# Patient Record
Sex: Female | Born: 1950 | Race: White | Hispanic: No | Marital: Married | Smoking: Never smoker
Health system: Southern US, Community
[De-identification: ages and names within clinical notes are randomized; demographics above are authoritative.]

## PROBLEM LIST (undated history)

## (undated) DIAGNOSIS — R079 Chest pain, unspecified: Secondary | ICD-10-CM

## (undated) DIAGNOSIS — F32A Depression, unspecified: Secondary | ICD-10-CM

## (undated) DIAGNOSIS — T7840XA Allergy, unspecified, initial encounter: Secondary | ICD-10-CM

## (undated) DIAGNOSIS — H40059 Ocular hypertension, unspecified eye: Secondary | ICD-10-CM

## (undated) DIAGNOSIS — M8580 Other specified disorders of bone density and structure, unspecified site: Secondary | ICD-10-CM

## (undated) DIAGNOSIS — R42 Dizziness and giddiness: Secondary | ICD-10-CM

## (undated) DIAGNOSIS — F329 Major depressive disorder, single episode, unspecified: Secondary | ICD-10-CM

## (undated) DIAGNOSIS — R519 Headache, unspecified: Secondary | ICD-10-CM

## (undated) DIAGNOSIS — E785 Hyperlipidemia, unspecified: Secondary | ICD-10-CM

## (undated) DIAGNOSIS — M4800 Spinal stenosis, site unspecified: Secondary | ICD-10-CM

## (undated) HISTORY — PX: EYE SURGERY: SHX253

## (undated) HISTORY — DX: Allergy, unspecified, initial encounter: T78.40XA

## (undated) HISTORY — DX: Other specified disorders of bone density and structure, unspecified site: M85.80

## (undated) HISTORY — PX: SPINE SURGERY: SHX786

## (undated) HISTORY — PX: JOINT REPLACEMENT: SHX530

## (undated) HISTORY — PX: APPENDECTOMY: SHX54

## (undated) HISTORY — DX: Hyperlipidemia, unspecified: E78.5

## (undated) HISTORY — PX: DILATION AND CURETTAGE OF UTERUS: SHX78

---
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
## 1986-10-25 HISTORY — PX: REDUCTION MAMMAPLASTY: SUR839

## 2005-12-06 ENCOUNTER — Ambulatory Visit: Payer: Self-pay | Admitting: General Practice

## 2005-12-24 ENCOUNTER — Ambulatory Visit: Payer: Self-pay | Admitting: Family Medicine

## 2006-04-01 ENCOUNTER — Ambulatory Visit: Payer: Self-pay | Admitting: General Surgery

## 2007-01-27 ENCOUNTER — Ambulatory Visit: Payer: Self-pay | Admitting: Family Medicine

## 2007-01-27 LAB — HM MAMMOGRAPHY

## 2008-04-05 ENCOUNTER — Ambulatory Visit: Payer: Self-pay | Admitting: Obstetrics and Gynecology

## 2008-04-05 ENCOUNTER — Other Ambulatory Visit: Payer: Self-pay

## 2008-04-19 ENCOUNTER — Ambulatory Visit: Payer: Self-pay | Admitting: Obstetrics and Gynecology

## 2008-05-22 ENCOUNTER — Emergency Department: Payer: Self-pay | Admitting: Emergency Medicine

## 2009-04-22 ENCOUNTER — Ambulatory Visit: Payer: Self-pay | Admitting: Obstetrics and Gynecology

## 2010-05-20 ENCOUNTER — Ambulatory Visit: Payer: Self-pay | Admitting: Obstetrics and Gynecology

## 2010-06-04 ENCOUNTER — Ambulatory Visit: Payer: Self-pay | Admitting: Gastroenterology

## 2010-06-04 LAB — HM COLONOSCOPY

## 2011-04-01 ENCOUNTER — Ambulatory Visit: Payer: Self-pay | Admitting: Family Medicine

## 2011-06-11 ENCOUNTER — Ambulatory Visit: Payer: Self-pay | Admitting: Obstetrics and Gynecology

## 2012-06-15 ENCOUNTER — Ambulatory Visit: Payer: Self-pay | Admitting: Obstetrics and Gynecology

## 2013-06-18 ENCOUNTER — Ambulatory Visit: Payer: Self-pay | Admitting: Obstetrics and Gynecology

## 2014-06-19 ENCOUNTER — Ambulatory Visit: Payer: Self-pay | Admitting: Obstetrics and Gynecology

## 2014-09-02 ENCOUNTER — Ambulatory Visit: Payer: Self-pay

## 2015-05-13 ENCOUNTER — Encounter: Payer: Self-pay | Admitting: Emergency Medicine

## 2015-05-13 ENCOUNTER — Ambulatory Visit: Payer: BLUE CROSS/BLUE SHIELD

## 2015-05-13 ENCOUNTER — Ambulatory Visit
Admission: EM | Admit: 2015-05-13 | Discharge: 2015-05-13 | Disposition: A | Discharge status: Home / Self Care | Payer: BLUE CROSS/BLUE SHIELD | Attending: Family Medicine | Admitting: Family Medicine

## 2015-05-13 DIAGNOSIS — R0781 Pleurodynia: Secondary | ICD-10-CM | POA: Diagnosis present

## 2015-05-13 DIAGNOSIS — S20211A Contusion of right front wall of thorax, initial encounter: Secondary | ICD-10-CM | POA: Insufficient documentation

## 2015-05-13 DIAGNOSIS — W19XXXA Unspecified fall, initial encounter: Secondary | ICD-10-CM | POA: Insufficient documentation

## 2015-05-13 MED ORDER — HYDROCODONE-ACETAMINOPHEN 5-325 MG PO TABS: 1.0000 | ORAL_TABLET | Freq: Four times a day (QID) | ORAL | Status: DC | PRN

## 2015-05-13 MED ORDER — MUPIROCIN 2 % EX OINT: 1.0000 "application " | TOPICAL_OINTMENT | Freq: Three times a day (TID) | CUTANEOUS | Status: DC

## 2015-05-13 NOTE — ED Provider Notes (Signed)
CSN: [id number]     Arrival date & time 05/13/15  1107 History   None    Chief Complaint  Patient presents with  . Fall  . Rib Injury   (Consider location/radiation/quality/duration/timing/severity/associated sxs/prior Treatment) HPI  This a 64 year old female who states that she fell approximately 6 feet standing on a rock wall landing into some rose bushes 2 days prior to this admission. She had numerous scrapes on her lower extremities and unfortunately hit her right ribs on the wall itself. Since then she's had a lot of pain in her right ribs where there is some bruising and extreme tenderness to palpation. She denies any shortness of breath hemoptysis or crepitus in the area. She has picked out several thorns from her lower extremities. There is also bruising in her inner thighs bilaterally.   History reviewed. No pertinent past medical history. Past Surgical History  Procedure Laterality Date  . Cesarean section    . Appendectomy     History reviewed. No pertinent family history. History  Substance Use Topics  . Smoking status: Never Smoker   . Smokeless tobacco: Never Used  . Alcohol Use: Yes   OB History    No data available     Review of Systems  All other systems reviewed and are negative.   Allergies  Prednisone  Home Medications   Prior to Admission medications   Medication Sig Start Date End Date Taking? Authorizing Provider  calcium & magnesium carbonates (MYLANTA) 311-232 MG per tablet Take 1 tablet by mouth daily.   Yes Historical Provider, MD  cholecalciferol (VITAMIN D) 400 UNITS TABS tablet Take 400 Units by mouth daily.   Yes Historical Provider, MD  citalopram (CELEXA) 10 MG tablet Take 10 mg by mouth daily.   Yes Historical Provider, MD  omega-3 acid ethyl esters (LOVAZA) 1 G capsule Take 1 g by mouth 2 (two) times daily.   Yes Historical Provider, MD  HYDROcodone-acetaminophen (NORCO/VICODIN) 5-325 MG per tablet Take 1-2 tablets by mouth every 6  (six) hours as needed. 05/13/15   Lorin Picket, PA-C  mupirocin ointment (BACTROBAN) 2 % Apply 1 application topically 3 (three) times daily. 05/13/15   Malachy Chamber Shaunessy Dobratz, PA-C   BP 113/65 mmHg  Pulse 66  Temp(Src) 98.6 F (37 C) (Tympanic)  Resp 16  Ht 5\' 2"  (1.575 m)  Wt 158 lb (71.668 kg)  BMI 28.89 kg/m2  SpO2 100% Physical Exam  Constitutional: She is oriented to person, place, and time. She appears well-developed and well-nourished.  HENT:  Head: Normocephalic and atraumatic.  Eyes: EOM are normal. Pupils are equal, round, and reactive to light.  Neck: Neck supple.  Pulmonary/Chest: Effort normal and breath sounds normal. No respiratory distress. She has no wheezes. She has no rales. She exhibits no tenderness.  Musculoskeletal:  Examination of the chest was done in the presence of Marsh Dolly., RN. The lungs show full expansion bilaterally and are clear to auscultation. Is no crepitus over the right lateral lung field. There is ecchymosis in 2 small areas along the rib probably 7 or 8 level. No induration is present. The sounds are good in the apices bilaterally. There is tenderness along the rib as the ecchymosis from the posterior axillary line to the costochondral junction. Lower extremity examination shows numerous noninfected scratches and bruises on the medial inner thighs proximally. Several puncture wounds are also seen and appeared to be healing.  Neurological: She is alert and oriented to person, place, and time.  Skin: Skin is warm and dry. There is erythema.  Psychiatric: She has a normal mood and affect. Her behavior is normal. Judgment and thought content normal.    ED Course  Procedures (including critical care time) Labs Review Labs Reviewed - No data to display  Imaging Review    Dg Ribs Unilateral W/chest Right  05/13/2015   CLINICAL DATA:  Fall, anterior right rib pain  EXAM: RIGHT RIBS AND CHEST - 3+ VIEW  COMPARISON:  09/02/2014  FINDINGS: Five views right  ribs submitted. No acute infiltrate or pulmonary edema. No rib fracture. No pneumothorax.  IMPRESSION: Negative.   Electronically Signed   By: Lahoma Crocker M.D.   On: 05/13/2015 12:26     MDM   1. Contusion of ribs, right, initial encounter    New Prescriptions   HYDROCODONE-ACETAMINOPHEN (NORCO/VICODIN) 5-325 MG PER TABLET    Take 1-2 tablets by mouth every 6 (six) hours as needed.   MUPIROCIN OINTMENT (BACTROBAN) 2 %    Apply 1 application topically 3 (three) times daily.   Plan: 1. Test/x-ray results and diagnosis reviewed with patient 2. rx as per orders; risks, benefits, potential side effects reviewed with patient 3. Recommend supportive treatment with pulmonary toilet, bactroban UGT to scratches. 4. F/u prn if symptoms worsen or don't improve    Lorin Picket, PA-C 05/13/15 1317

## 2015-05-13 NOTE — Discharge Instructions (Signed)
Rib Contusion °A rib contusion (bruise) can occur by a blow to the chest or by a fall against a hard object. Usually these will be much better in a couple weeks. If X-rays were taken today and there are no broken bones (fractures), the diagnosis of bruising is made. However, broken ribs may not show up for several days, or may be discovered later on a routine X-ray when signs of healing show up. If this happens to you, it does not mean that something was missed on the X-ray, but simply that it did not show up on the first X-rays. Earlier diagnosis will not usually change the treatment. °HOME CARE INSTRUCTIONS  °· Avoid strenuous activity. Be careful during activities and avoid bumping the injured ribs. Activities that pull on the injured ribs and cause pain should be avoided, if possible. °· For the first day or two, an ice pack used every 20 minutes while awake may be helpful. Put ice in a plastic bag and put a towel between the bag and the skin. °· Eat a normal, well-balanced diet. Drink plenty of fluids to avoid constipation. °· Take deep breaths several times a day to keep lungs free of infection. Try to cough several times a day. Splint the injured area with a pillow while coughing to ease pain. Coughing can help prevent pneumonia. °· Wear a rib belt or binder only if told to do so by your caregiver. If you are wearing a rib belt or binder, you must do the breathing exercises as directed by your caregiver. If not used properly, rib belts or binders restrict breathing which can lead to pneumonia. °· Only take over-the-counter or prescription medicines for pain, discomfort, or fever as directed by your caregiver. °SEEK MEDICAL CARE IF:  °· You or your child has an oral temperature above 102° F (38.9° C). °· Your baby is older than 3 months with a rectal temperature of 100.5° F (38.1° C) or higher for more than 1 day. °· You develop a cough, with thick or bloody sputum. °SEEK IMMEDIATE MEDICAL CARE IF:  °· You  have difficulty breathing. °· You feel sick to your stomach (nausea), have vomiting or belly (abdominal) pain. °· You have worsening pain, not controlled with medications, or there is a change in the location of the pain. °· You develop sweating or radiation of the pain into the arms, jaw or shoulders, or become light headed or faint. °· You or your child has an oral temperature above 102° F (38.9° C), not controlled by medicine. °· Your or your baby is older than 3 months with a rectal temperature of 102° F (38.9° C) or higher. °· Your baby is 3 months old or younger with a rectal temperature of 100.4° F (38° C) or higher. °MAKE SURE YOU:  °· Understand these instructions. °· Will watch your condition. °· Will get help right away if you are not doing well or get worse. °Document Released: 07/06/2001 Document Revised: 02/05/2013 Document Reviewed: 05/29/2008 °Exit[employer]® Patient Information ©2015 [employer]. This information is not intended to replace advice given to you by your health care provider. Make sure you discuss any questions you have with your health care provider. ° °

## 2015-05-13 NOTE — ED Notes (Signed)
Patient c/o right sided rib pain worse with deep breath and coughing.  Patient states that she fell off a wall 2 days ago and hit the right side of her ribcage on the wall.

## 2015-06-04 ENCOUNTER — Other Ambulatory Visit: Payer: Self-pay | Admitting: Obstetrics and Gynecology

## 2015-06-04 DIAGNOSIS — Z1231 Encounter for screening mammogram for malignant neoplasm of breast: Secondary | ICD-10-CM

## 2015-06-04 DIAGNOSIS — Z803 Family history of malignant neoplasm of breast: Secondary | ICD-10-CM

## 2015-06-23 ENCOUNTER — Ambulatory Visit
Admission: RE | Admit: 2015-06-23 | Discharge: 2015-06-23 | Disposition: A | Discharge status: Home / Self Care | Payer: BLUE CROSS/BLUE SHIELD | Source: Ambulatory Visit | Attending: Obstetrics and Gynecology | Admitting: Obstetrics and Gynecology

## 2015-06-23 DIAGNOSIS — Z1231 Encounter for screening mammogram for malignant neoplasm of breast: Secondary | ICD-10-CM | POA: Insufficient documentation

## 2015-06-23 DIAGNOSIS — Z803 Family history of malignant neoplasm of breast: Secondary | ICD-10-CM

## 2015-09-10 DIAGNOSIS — F32A Depression, unspecified: Secondary | ICD-10-CM

## 2015-09-10 DIAGNOSIS — F329 Major depressive disorder, single episode, unspecified: Secondary | ICD-10-CM | POA: Insufficient documentation

## 2015-09-10 DIAGNOSIS — M8580 Other specified disorders of bone density and structure, unspecified site: Secondary | ICD-10-CM

## 2015-09-10 DIAGNOSIS — E7800 Pure hypercholesterolemia, unspecified: Secondary | ICD-10-CM

## 2015-09-10 DIAGNOSIS — J309 Allergic rhinitis, unspecified: Secondary | ICD-10-CM

## 2015-09-12 ENCOUNTER — Ambulatory Visit (INDEPENDENT_AMBULATORY_CARE_PROVIDER_SITE_OTHER): Payer: BLUE CROSS/BLUE SHIELD | Admitting: Unknown Physician Specialty

## 2015-09-12 ENCOUNTER — Encounter: Payer: Self-pay | Admitting: Unknown Physician Specialty

## 2015-09-12 VITALS — BP 114/75 | HR 63 | Temp 97.8°F | Ht 62.2 in | Wt 163.0 lb

## 2015-09-12 DIAGNOSIS — F329 Major depressive disorder, single episode, unspecified: Secondary | ICD-10-CM | POA: Diagnosis not present

## 2015-09-12 DIAGNOSIS — Z23 Encounter for immunization: Secondary | ICD-10-CM | POA: Diagnosis not present

## 2015-09-12 DIAGNOSIS — F32A Depression, unspecified: Secondary | ICD-10-CM

## 2015-09-12 MED ORDER — CITALOPRAM HYDROBROMIDE 10 MG PO TABS: 10.0000 mg | ORAL_TABLET | Freq: Every day | ORAL | Status: DC

## 2015-09-12 NOTE — Progress Notes (Signed)
   BP 114/75 mmHg  Pulse 63  Temp(Src) 97.8 F (36.6 C)  Ht 5' 2.2" (1.58 m)  Wt 163 lb (73.936 kg)  BMI 29.62 kg/m2  SpO2 97%  LMP  (LMP Unknown)   Subjective:    Patient ID: Breanna Davidson, female    DOB: 09-06-1951, 65 y.o.   MRN: [id number]  HPI: Breanna Davidson is a 64 y.o. female  Chief Complaint  Patient presents with  . Medication Refill    pt states she needs refill on celexa   She was wondering if she should stop.  She feels good and has been on it for 6 months.    Depression screen PHQ 2/9 09/12/2015  Decreased Interest 0  Down, Depressed, Hopeless 0  PHQ - 2 Score 0      Relevant past medical, surgical, family and social history reviewed and updated as indicated. Interim medical history since our last visit reviewed. Allergies and medications reviewed and updated.  Review of Systems  Per HPI unless specifically indicated above     Objective:    BP 114/75 mmHg  Pulse 63  Temp(Src) 97.8 F (36.6 C)  Ht 5' 2.2" (1.58 m)  Wt 163 lb (73.936 kg)  BMI 29.62 kg/m2  SpO2 97%  LMP  (LMP Unknown)  Wt Readings from Last 3 Encounters:  09/12/15 163 lb (73.936 kg)  03/10/15 161 lb (73.029 kg)  05/13/15 158 lb (71.668 kg)    Physical Exam  Constitutional: She is oriented to person, place, and time. She appears well-developed and well-nourished. No distress.  HENT:  Head: Normocephalic and atraumatic.  Eyes: Conjunctivae and lids are normal. Right eye exhibits no discharge. Left eye exhibits no discharge. No scleral icterus.  Cardiovascular: Normal rate, regular rhythm and normal heart sounds.   Pulmonary/Chest: Effort normal. No respiratory distress.  Abdominal: Normal appearance. There is no splenomegaly or hepatomegaly.  Musculoskeletal: Normal range of motion.  Neurological: She is alert and oriented to person, place, and time.  Skin: Skin is intact. No rash noted. No pallor.  Psychiatric: She has a normal mood and affect. Her behavior is  normal. Judgment and thought content normal.     Assessment & Plan:   Problem List Items Addressed This Visit      Unprioritized   Depression    Doing well.  Encouraged to consider tapering down after 6 months.         Other Visit Diagnoses    Immunization due    -  Primary    Relevant Orders    Varicella-zoster vaccine subcutaneous (Completed)        Follow up plan: Return in about 6 months (around 03/11/2016).

## 2015-09-12 NOTE — Assessment & Plan Note (Signed)
Doing well.  Encouraged to consider tapering down after 6 months.

## 2015-12-22 ENCOUNTER — Encounter: Payer: Self-pay | Admitting: Unknown Physician Specialty

## 2015-12-22 ENCOUNTER — Ambulatory Visit
Admission: RE | Admit: 2015-12-22 | Discharge: 2015-12-22 | Disposition: A | Discharge status: Home / Self Care | Payer: BLUE CROSS/BLUE SHIELD | Source: Ambulatory Visit | Attending: Unknown Physician Specialty | Admitting: Unknown Physician Specialty

## 2015-12-22 ENCOUNTER — Ambulatory Visit (INDEPENDENT_AMBULATORY_CARE_PROVIDER_SITE_OTHER): Payer: BLUE CROSS/BLUE SHIELD | Admitting: Unknown Physician Specialty

## 2015-12-22 VITALS — BP 116/65 | HR 66 | Temp 98.7°F | Ht 62.6 in | Wt 162.0 lb

## 2015-12-22 DIAGNOSIS — R05 Cough: Secondary | ICD-10-CM | POA: Insufficient documentation

## 2015-12-22 DIAGNOSIS — R059 Cough, unspecified: Secondary | ICD-10-CM

## 2015-12-22 MED ORDER — DOXYCYCLINE HYCLATE 100 MG PO TABS: 100.0000 mg | ORAL_TABLET | Freq: Two times a day (BID) | ORAL | Status: DC

## 2015-12-22 NOTE — Progress Notes (Signed)
BP 116/65 mmHg  Pulse 66  Temp(Src) 98.7 F (37.1 C)  Ht 5' 2.6" (1.59 m)  Wt 162 lb (73.483 kg)  BMI 29.07 kg/m2  SpO2 97%  LMP  (LMP Unknown)   Subjective:    Patient ID: Breanna Davidson, female    DOB: April 02, 1951, 65 y.o.   MRN: [id number]  HPI: Breanna Davidson is a 65 y.o. female  Chief Complaint  Patient presents with  . Cough    pt states she has had a dry cough for a while. she states the cold weather and being around somebody smoking will make her cough   States her husband wanted her to come in.   Cough This is a chronic problem. Episode onset: 4 months ago. The problem has been unchanged. The cough is non-productive. Associated symptoms include shortness of breath and wheezing. Pertinent negatives include no chest pain, headaches, heartburn, nasal congestion or postnasal drip. Exacerbated by: improved with drinking.     Relevant past medical, surgical, family and social history reviewed and updated as indicated. Interim medical history since our last visit reviewed. Allergies and medications reviewed and updated.  Review of Systems  HENT: Negative for postnasal drip.   Respiratory: Positive for cough, shortness of breath and wheezing.   Cardiovascular: Negative for chest pain.  Gastrointestinal: Negative for heartburn.  Neurological: Negative for headaches.    Per HPI unless specifically indicated above     Objective:    BP 116/65 mmHg  Pulse 66  Temp(Src) 98.7 F (37.1 C)  Ht 5' 2.6" (1.59 m)  Wt 162 lb (73.483 kg)  BMI 29.07 kg/m2  SpO2 97%  LMP  (LMP Unknown)  Wt Readings from Last 3 Encounters:  12/22/15 162 lb (73.483 kg)  09/12/15 163 lb (73.936 kg)  03/10/15 161 lb (73.029 kg)    Physical Exam  Constitutional: She is oriented to person, place, and time. She appears well-developed and well-nourished. No distress.  HENT:  Head: Normocephalic and atraumatic.  Eyes: Conjunctivae and lids are normal. Right eye exhibits no discharge.  Left eye exhibits no discharge. No scleral icterus.  Neck: Normal range of motion. Neck supple. No JVD present. Carotid bruit is not present.  Cardiovascular: Normal rate, regular rhythm and normal heart sounds.   Pulmonary/Chest: Effort normal and breath sounds normal.  Noted crackles and wheezing left lower lungs clear with coughing  Abdominal: Normal appearance. There is no splenomegaly or hepatomegaly.  Musculoskeletal: Normal range of motion.  Neurological: She is alert and oriented to person, place, and time.  Skin: Skin is warm, dry and intact. No rash noted. No pallor.  Psychiatric: She has a normal mood and affect. Her behavior is normal. Judgment and thought content normal.    Results for orders placed or performed in visit on 09/10/15  HM MAMMOGRAPHY  Result Value Ref Range   HM Mammogram from [facility]   HM COLONOSCOPY  Result Value Ref Range   HM Colonoscopy from [facility]       Assessment & Plan:   Problem List Items Addressed This Visit    None    Visit Diagnoses    Cough    -  Primary    Pt with cough for 3 months.  Use Flonase daily.  Get a chest x-ray.  rx doxycycline to r/u atypical    Relevant Orders    Spirometry with Graph (Completed)    DG Chest 2 View        Follow up plan: Return  in about 1 week (around 12/29/2015).      -

## 2016-01-13 ENCOUNTER — Encounter: Payer: Self-pay | Admitting: Unknown Physician Specialty

## 2016-01-13 ENCOUNTER — Ambulatory Visit (INDEPENDENT_AMBULATORY_CARE_PROVIDER_SITE_OTHER): Payer: BLUE CROSS/BLUE SHIELD | Admitting: Unknown Physician Specialty

## 2016-01-13 VITALS — BP 108/72 | HR 63 | Temp 98.3°F | Ht 62.1 in | Wt 160.4 lb

## 2016-01-13 DIAGNOSIS — R059 Cough, unspecified: Secondary | ICD-10-CM

## 2016-01-13 DIAGNOSIS — R05 Cough: Secondary | ICD-10-CM | POA: Diagnosis not present

## 2016-01-13 NOTE — Progress Notes (Signed)
   BP 108/72 mmHg  Pulse 63  Temp(Src) 98.3 F (36.8 C)  Ht 5' 2.1" (1.577 m)  Wt 160 lb 6.4 oz (72.757 kg)  BMI 29.26 kg/m2  SpO2 98%  LMP  (LMP Unknown)   Subjective:    Patient ID: Breanna Davidson, female    DOB: 11-19-1950, 65 y.o.   MRN: [id number]  HPI: Breanna Davidson is a 65 y.o. female  Chief Complaint  Patient presents with  . Cough    pt states her cough has gotten better but states she still has a dry cough at night   Feels like dry cough is coming from her throat.  Thinks it might be allergies.  Takes daily Flonase.  Last chest x-ray was normal.     Relevant past medical, surgical, family and social history reviewed and updated as indicated. Interim medical history since our last visit reviewed. Allergies and medications reviewed and updated.  Review of Systems  Per HPI unless specifically indicated above     Objective:    BP 108/72 mmHg  Pulse 63  Temp(Src) 98.3 F (36.8 C)  Ht 5' 2.1" (1.577 m)  Wt 160 lb 6.4 oz (72.757 kg)  BMI 29.26 kg/m2  SpO2 98%  LMP  (LMP Unknown)  Wt Readings from Last 3 Encounters:  01/13/16 160 lb 6.4 oz (72.757 kg)  12/22/15 162 lb (73.483 kg)  09/12/15 163 lb (73.936 kg)    Physical Exam  Constitutional: She is oriented to person, place, and time. She appears well-developed and well-nourished. No distress.  HENT:  Head: Normocephalic and atraumatic.  Right Ear: Tympanic membrane and ear canal normal.  Left Ear: Tympanic membrane and ear canal normal.  Nose: Right sinus exhibits no maxillary sinus tenderness and no frontal sinus tenderness. Left sinus exhibits no maxillary sinus tenderness and no frontal sinus tenderness.  Mouth/Throat: Oropharynx is clear and moist and mucous membranes are normal.  Eyes: Conjunctivae and lids are normal. Right eye exhibits no discharge. Left eye exhibits no discharge. No scleral icterus.  Cardiovascular: Normal rate and regular rhythm.   Pulmonary/Chest: Effort normal and  breath sounds normal. No respiratory distress.  Abdominal: Normal appearance. There is no splenomegaly or hepatomegaly.  Musculoskeletal: Normal range of motion.  Neurological: She is alert and oriented to person, place, and time.  Skin: Skin is intact. No rash noted. No pallor.  Psychiatric: She has a normal mood and affect. Her behavior is normal. Judgment and thought content normal.    Results for orders placed or performed in visit on 09/10/15  HM MAMMOGRAPHY  Result Value Ref Range   HM Mammogram from [facility]   HM COLONOSCOPY  Result Value Ref Range   HM Colonoscopy from [facility]       Assessment & Plan:   Problem List Items Addressed This Visit    None    Visit Diagnoses    Cough    -  Primary    Seems to be resolved.  F/U prn++++++++++++++++++++++++++++++        Follow up plan: No Follow-up on file.

## 2016-02-25 DIAGNOSIS — H10813 Pingueculitis, bilateral: Secondary | ICD-10-CM | POA: Diagnosis not present

## 2016-03-15 ENCOUNTER — Ambulatory Visit: Payer: BLUE CROSS/BLUE SHIELD | Admitting: Unknown Physician Specialty

## 2016-03-17 DIAGNOSIS — R079 Chest pain, unspecified: Secondary | ICD-10-CM | POA: Diagnosis not present

## 2016-03-17 DIAGNOSIS — R0789 Other chest pain: Secondary | ICD-10-CM | POA: Diagnosis not present

## 2016-03-17 DIAGNOSIS — R072 Precordial pain: Secondary | ICD-10-CM | POA: Diagnosis not present

## 2016-03-19 ENCOUNTER — Encounter: Payer: Self-pay | Admitting: Unknown Physician Specialty

## 2016-03-19 ENCOUNTER — Ambulatory Visit (INDEPENDENT_AMBULATORY_CARE_PROVIDER_SITE_OTHER): Payer: Medicare Other | Admitting: Unknown Physician Specialty

## 2016-03-19 VITALS — BP 106/69 | HR 72 | Temp 98.6°F | Ht 61.3 in | Wt 163.0 lb

## 2016-03-19 DIAGNOSIS — Z0000 Encounter for general adult medical examination without abnormal findings: Secondary | ICD-10-CM | POA: Diagnosis not present

## 2016-03-19 DIAGNOSIS — R0789 Other chest pain: Secondary | ICD-10-CM | POA: Insufficient documentation

## 2016-03-19 DIAGNOSIS — F32A Depression, unspecified: Secondary | ICD-10-CM

## 2016-03-19 DIAGNOSIS — F329 Major depressive disorder, single episode, unspecified: Secondary | ICD-10-CM | POA: Diagnosis not present

## 2016-03-19 NOTE — Patient Instructions (Signed)
Please do call to schedule your bone denisty and mammogram; the number to schedule one at either [facility] or [facility] is [phone]

## 2016-03-19 NOTE — Assessment & Plan Note (Signed)
Stable.  Discussed weaning of Citalopram

## 2016-03-19 NOTE — Assessment & Plan Note (Addendum)
[facility]der referral to GI.  Pt does not want to take a daily PPI

## 2016-03-19 NOTE — Progress Notes (Signed)
BP 106/69 mmHg  Pulse 72  Temp(Src) 98.6 F (37 C)  Ht 5' 1.3" (1.557 m)  Wt 163 lb (73.936 kg)  BMI 30.50 kg/m2  SpO2 98%  LMP  (LMP Unknown)   Subjective:    Patient ID: Breanna Davidson, female    DOB: 1951-02-02, 65 y.o.   MRN: [id number]  HPI: Breanna Davidson is a 65 y.o. female  Chief Complaint  Patient presents with  . Depression  . Labs Only    Hep C and HIV orders entered   Depression screen [facility] 2/9 03/19/2016 09/12/2015  Decreased Interest 0 0  Down, Depressed, Hopeless 0 0  PHQ - 2 Score 0 0    She would like to wean off Citaloprine.    Pt states [place] was in the ER 2 [place] the beach.  I can't pull up chart on care everywhere but reviewed copies she brought in.  She had crushing chest pain with a negative work-up.  The pain is now better but worried that it will come back.  She would like to see a Copywriter, advertising.       Relevant past medical, surgical, family and social history reviewed and updated as indicated. Interim medical history since our last visit reviewed. Allergies and medications reviewed and updated.  Review of Systems  Per HPI unless specifically indicated above     Objective:    BP 106/69 mmHg  Pulse 72  Temp(Src) 98.6 F (37 C)  Ht 5' 1.3" (1.557 m)  Wt 163 lb (73.936 kg)  BMI 30.50 kg/m2  SpO2 98%  LMP  (LMP Unknown)  Wt Readings from Last 3 Encounters:  03/19/16 163 lb (73.936 kg)  01/13/16 160 lb 6.4 oz (72.757 kg)  12/22/15 162 lb (73.483 kg)    Physical Exam  Constitutional: She is oriented to person, place, and time. She appears well-developed and well-nourished. No distress.  HENT:  Head: Normocephalic and atraumatic.  Eyes: Conjunctivae and lids are normal. Right eye exhibits no discharge. Left eye exhibits no discharge. No scleral icterus.  Neck: Normal range of motion. Neck supple. No JVD present. Carotid bruit is not present.  Cardiovascular: Normal rate, regular rhythm and normal heart  sounds.   Pulmonary/Chest: Effort normal and breath sounds normal.  Abdominal: Normal appearance. There is no splenomegaly or hepatomegaly.  Musculoskeletal: Normal range of motion.  Neurological: She is alert and oriented to person, place, and time.  Skin: Skin is warm, dry and intact. No rash noted. No pallor.  Psychiatric: She has a normal mood and affect. Her behavior is normal. Judgment and thought content normal.    Results for orders placed or performed in visit on 09/10/15  HM MAMMOGRAPHY  Result Value Ref Range   HM Mammogram from [facility]   HM COLONOSCOPY  Result Value Ref Range   HM Colonoscopy from [facility]       Assessment & Plan:   Problem List Items Addressed This Visit      Unprioritized   Chest pain, non-cardiac    [facility] referral to GI.  Pt does not want to take a daily PPI      Depression    Stable.  Discussed weaning of Citalopram         Other Visit Diagnoses    Health care maintenance    -  Primary    Relevant Orders    Hepatitis C antibody    HIV antibody    DG Bone Density  MM DIGITAL SCREENING BILATERAL        Follow up plan: Return for physical.

## 2016-03-20 LAB — HEPATITIS C ANTIBODY

## 2016-03-20 LAB — HIV ANTIBODY (ROUTINE TESTING W REFLEX): HIV Screen 4th Generation wRfx: NONREACTIVE

## 2016-07-06 ENCOUNTER — Other Ambulatory Visit: Payer: Self-pay | Admitting: Unknown Physician Specialty

## 2016-07-06 ENCOUNTER — Ambulatory Visit
Admission: RE | Admit: 2016-07-06 | Discharge: 2016-07-06 | Disposition: A | Discharge status: Home / Self Care | Payer: Medicare Other | Source: Ambulatory Visit | Attending: Family Medicine | Admitting: Family Medicine

## 2016-07-06 ENCOUNTER — Telehealth: Payer: Self-pay | Admitting: Family Medicine

## 2016-07-06 ENCOUNTER — Ambulatory Visit
Admission: RE | Admit: 2016-07-06 | Discharge: 2016-07-06 | Disposition: A | Discharge status: Home / Self Care | Payer: Medicare Other | Source: Ambulatory Visit | Attending: Unknown Physician Specialty | Admitting: Unknown Physician Specialty

## 2016-07-06 DIAGNOSIS — M8589 Other specified disorders of bone density and structure, multiple sites: Secondary | ICD-10-CM | POA: Insufficient documentation

## 2016-07-06 DIAGNOSIS — IMO0002 Reserved for concepts with insufficient information to code with codable children: Secondary | ICD-10-CM

## 2016-07-06 DIAGNOSIS — Z13820 Encounter for screening for osteoporosis: Secondary | ICD-10-CM | POA: Diagnosis not present

## 2016-07-06 DIAGNOSIS — M8588 Other specified disorders of bone density and structure, other site: Secondary | ICD-10-CM | POA: Diagnosis not present

## 2016-07-06 DIAGNOSIS — N62 Hypertrophy of breast: Secondary | ICD-10-CM | POA: Insufficient documentation

## 2016-07-06 DIAGNOSIS — Z0000 Encounter for general adult medical examination without abnormal findings: Secondary | ICD-10-CM

## 2016-07-06 DIAGNOSIS — N644 Mastodynia: Secondary | ICD-10-CM | POA: Diagnosis not present

## 2016-07-06 NOTE — Telephone Encounter (Signed)
Scarlett from [facility] called and would like to have orders placed for the pt to have a diagnostic mammogram and ultrasound of her right breast due to breast enlargement .

## 2016-07-06 NOTE — Telephone Encounter (Signed)
Orders placed.

## 2016-07-08 ENCOUNTER — Encounter: Payer: Self-pay | Admitting: Family Medicine

## 2016-07-23 ENCOUNTER — Encounter: Payer: Self-pay | Admitting: Unknown Physician Specialty

## 2016-07-23 ENCOUNTER — Ambulatory Visit (INDEPENDENT_AMBULATORY_CARE_PROVIDER_SITE_OTHER): Payer: Medicare Other | Admitting: Unknown Physician Specialty

## 2016-07-23 VITALS — BP 113/73 | HR 72 | Temp 98.8°F | Ht 62.3 in | Wt 162.8 lb

## 2016-07-23 DIAGNOSIS — Z6828 Body mass index (BMI) 28.0-28.9, adult: Secondary | ICD-10-CM | POA: Insufficient documentation

## 2016-07-23 DIAGNOSIS — Z23 Encounter for immunization: Secondary | ICD-10-CM

## 2016-07-23 DIAGNOSIS — E7800 Pure hypercholesterolemia, unspecified: Secondary | ICD-10-CM | POA: Diagnosis not present

## 2016-07-23 DIAGNOSIS — E663 Overweight: Secondary | ICD-10-CM | POA: Insufficient documentation

## 2016-07-23 DIAGNOSIS — E669 Obesity, unspecified: Secondary | ICD-10-CM | POA: Insufficient documentation

## 2016-07-23 DIAGNOSIS — Z0000 Encounter for general adult medical examination without abnormal findings: Secondary | ICD-10-CM | POA: Diagnosis not present

## 2016-07-23 DIAGNOSIS — Z6829 Body mass index (BMI) 29.0-29.9, adult: Secondary | ICD-10-CM | POA: Insufficient documentation

## 2016-07-23 NOTE — Progress Notes (Signed)
BP 113/73 (BP Location: Left Arm, Patient Position: Sitting, Cuff Size: Large)   Pulse 72   Temp 98.8 F (37.1 C)   Ht 5' 2.3" (1.582 m)   Wt 162 lb 12.8 oz (73.8 kg)   LMP  (LMP Unknown)   SpO2 98%   BMI 29.49 kg/m    Subjective:    Patient ID: Breanna Davidson, female    DOB: 02-06-1951, 65 y.o.   MRN: [id number]  HPI: Breanna Davidson is a 65 y.o. female  Chief Complaint  Patient presents with  . Medicare Wellness   Depression screen [facility] 2/9 07/23/2016 03/19/2016 09/12/2015  Decreased Interest 0 0 0  Down, Depressed, Hopeless 0 0 0  PHQ - 2 Score 0 0 0  Altered sleeping 1 - -  Tired, decreased energy 0 - -  Change in appetite 0 - -  Feeling bad or failure about yourself  0 - -  Trouble concentrating 0 - -  Moving slowly or fidgety/restless 0 - -  Suicidal thoughts 0 - -  PHQ-9 Score 1 - -   Functional Status Survey: Is the patient deaf or have difficulty hearing?: No Does the patient have difficulty seeing, even when wearing glasses/contacts?: No Does the patient have difficulty concentrating, remembering, or making decisions?: No Does the patient have difficulty walking or climbing stairs?: No Does the patient have difficulty dressing or bathing?: No Does the patient have difficulty doing errands alone such as visiting a doctor's office or shopping?: No  Fall Risk  07/23/2016 03/19/2016  Falls in the past year? No No    Obesity On a keto diet.  Just started and lost 1 pound  Relevant past medical, surgical, family and social history reviewed and updated as indicated. Interim medical history since our last visit reviewed. Allergies and medications reviewed and updated.  Review of Systems  Per HPI unless specifically indicated above     Objective:    BP 113/73 (BP Location: Left Arm, Patient Position: Sitting, Cuff Size: Large)   Pulse 72   Temp 98.8 F (37.1 C)   Ht 5' 2.3" (1.582 m)   Wt 162 lb 12.8 oz (73.8 kg)   LMP  (LMP Unknown)   SpO2  98%   BMI 29.49 kg/m   Wt Readings from Last 3 Encounters:  07/23/16 162 lb 12.8 oz (73.8 kg)  03/19/16 163 lb (73.9 kg)  01/13/16 160 lb 6.4 oz (72.8 kg)    Physical Exam  Constitutional: She is oriented to person, place, and time. She appears well-developed and well-nourished.  HENT:  Head: Normocephalic and atraumatic.  Eyes: Pupils are equal, round, and reactive to light. Right eye exhibits no discharge. Left eye exhibits no discharge. No scleral icterus.  Neck: Normal range of motion. Neck supple. Carotid bruit is not present. No thyromegaly present.  Cardiovascular: Normal rate, regular rhythm and normal heart sounds.  Exam reveals no gallop and no friction rub.   No murmur heard. Pulmonary/Chest: Effort normal and breath sounds normal. No respiratory distress. She has no wheezes. She has no rales.  Abdominal: Soft. Bowel sounds are normal. There is no tenderness. There is no rebound.  Genitourinary: No breast swelling, tenderness or discharge.  Musculoskeletal: Normal range of motion.  Lymphadenopathy:    She has no cervical adenopathy.  Neurological: She is alert and oriented to person, place, and time.  Skin: Skin is warm, dry and intact. No rash noted.  Psychiatric: She has a normal mood and affect. Her  speech is normal and behavior is normal. Judgment and thought content normal. Cognition and memory are normal.    Results for orders placed or performed in visit on 03/19/16  Hepatitis C antibody  Result Value Ref Range   Hep C Virus Ab <0.1 0.0 - 0.9 s/co ratio  HIV antibody  Result Value Ref Range   HIV Screen 4th Generation wRfx Non Reactive Non Reactive      Assessment & Plan:   + Problem List Items Addressed This Visit      Unprioritized   Hypercholesteremia    Check lipid panel      Relevant Orders   Lipid Panel w/o Chol/HDL Ratio   Comprehensive metabolic panel   Overweight    Discussed.  She is on a keto diet now       Other Visit Diagnoses     Need for influenza vaccination    -  Primary   Relevant Orders   Flu vaccine HIGH DOSE PF (Completed)   Need for pneumococcal vaccination       Relevant Orders   Pneumococcal conjugate vaccine 13-valent IM (Completed)   Routine general medical examination at a health care facility       Relevant Orders   EKG 12-Lead (Completed)   Comprehensive metabolic panel       Follow up plan: Return in about 1 year (around 07/23/2017).

## 2016-07-23 NOTE — Assessment & Plan Note (Signed)
Check lipid panel  

## 2016-07-23 NOTE — Patient Instructions (Addendum)
Pneumococcal Conjugate Vaccine (PCV13)  1. Why get vaccinated? Vaccination can protect both children and adults from pneumococcal disease. Pneumococcal disease is caused by bacteria that can spread from person to person through close contact. It can cause ear infections, and it can also lead to more serious infections of the:  Lungs (pneumonia),  Blood (bacteremia), and  Covering of the brain and spinal cord (meningitis). Pneumococcal pneumonia is most common among adults. Pneumococcal meningitis can cause deafness and brain damage, and it kills about 1 child in 10 who get it. Anyone can get pneumococcal disease, but children under 28 years of age and adults 43 years and older, people with certain medical conditions, and cigarette smokers are at the highest risk. Before there was a vaccine, the [place] saw:  more than 700 cases of meningitis,  about 13,000 blood infections,  about 5 million ear infections, and  about 200 deaths in children under 5 each year from pneumococcal disease. Since vaccine became available, severe pneumococcal disease in these children has fallen by 88%. About 18,000 older adults die of pneumococcal disease each year in the [place]. Treatment of pneumococcal infections with penicillin and other drugs is not as effective as it used to be, because some strains of the disease have become resistant to these drugs. This makes prevention of the disease, through vaccination, even more important. 2. PCV13 vaccine Pneumococcal conjugate vaccine (called PCV13) protects against 13 types of pneumococcal bacteria. PCV13 is routinely given to children at 2, 4, 6, and 65-74 months of age. It is also recommended for children and adults 70 to 70 years of age with certain health conditions, and for all adults 64 years of age and older. Your doctor can give you details. 3. Some people should not get this vaccine Anyone who has ever had a life-threatening allergic reaction  to a dose of this vaccine, to an earlier pneumococcal vaccine called PCV7, or to any vaccine containing diphtheria toxoid (for example, DTaP), should not get PCV13. Anyone with a severe allergy to any component of PCV13 should not get the vaccine. Tell your doctor if the person being vaccinated has any severe allergies. If the person scheduled for vaccination is not feeling well, your healthcare provider might decide to reschedule the shot on another day. 4. Risks of a vaccine reaction With any medicine, including vaccines, there is a chance of reactions. These are usually mild and go away on their own, but serious reactions are also possible. Problems reported following PCV13 varied by age and dose in the series. The most common problems reported among children were:  About half became drowsy after the shot, had a temporary loss of appetite, or had redness or tenderness where the shot was given.  About 1 out of 3 had swelling where the shot was given.  About 1 out of 3 had a mild fever, and about 1 in 20 had a fever over 102.55F.  Up to about 8 out of 10 became fussy or irritable. Adults have reported pain, redness, and swelling where the shot was given; also mild fever, fatigue, headache, chills, or muscle pain. Young children who get PCV13 along with inactivated flu vaccine at the same time may be at increased risk for seizures caused by fever. Ask your doctor for more information. Problems that could happen after any vaccine:  People sometimes faint after a medical procedure, including vaccination. Sitting or lying down for about 15 minutes can help prevent fainting, and injuries caused by a fall.  Tell your doctor if you feel dizzy, or have vision changes or ringing in the ears.  Some older children and adults get severe pain in the shoulder and have difficulty moving the arm where a shot was given. This happens very rarely.  Any medication can cause a severe allergic reaction. Such  reactions from a vaccine are very rare, estimated at about 1 in a million doses, and would happen within a few minutes to a few hours after the vaccination. As with any medicine, there is a very small chance of a vaccine causing a serious injury or death. The safety of vaccines is always being monitored. For more information, visit: http://www.aguilar.org/ 5. What if there is a serious reaction? What should I look for?  Look for anything that concerns you, such as signs of a severe allergic reaction, very high fever, or unusual behavior. Signs of a severe allergic reaction can include hives, swelling of the face and throat, difficulty breathing, a fast heartbeat, dizziness, and weakness-usually within a few minutes to a few hours after the vaccination. What should I do?  If you think it is a severe allergic reaction or other emergency that can't wait, call 9-1-1 or get the person to the nearest hospital. Otherwise, call your doctor. Reactions should be reported to the Vaccine Adverse Event Reporting System (VAERS). Your doctor should file this report, or you can do it yourself through the VAERS web site at www.vaers.SamedayNews.es, or by calling (404)070-1811. VAERS does not give medical advice. 6. The National Vaccine Injury Compensation Program The [employer] Vaccine Injury Compensation Program (VICP) is a federal program that was created to compensate people who may have been injured by certain vaccines. Persons who believe they may have been injured by a vaccine can learn about the program and about filing a claim by calling 828-403-5283 or visiting the [place] website at GoldCloset.com.ee. There is a time limit to file a claim for compensation. 7. How can I learn more?  Ask your healthcare provider. He or she can give you the vaccine package insert or suggest other sources of information.  Call your local or state health department.  Contact the Centers for Disease Control and  Prevention (CDC):  Call 9715449532 (1-800-CDC-INFO) or  Visit CDC's website at http://hunter.com/ Vaccine Information Statement PCV13 Vaccine (08/29/2014)   This information is not intended to replace advice given to you by your health care provider. Make sure you discuss any questions you have with your health care provider.   Document Released: 08/08/2006 Document Revised: 11/01/2014 Document Reviewed: 09/05/2014 Elsevier Interactive Patient Education 2016 Elsevier Inc. Influenza (Flu) Vaccine (Inactivated or Recombinant):  1. Why get vaccinated? Influenza ("flu") is a contagious disease that spreads around the [place] every year, usually between October and May. Flu is caused by influenza viruses, and is spread mainly by coughing, sneezing, and close contact. Anyone can get flu. Flu strikes suddenly and can last several days. Symptoms vary by age, but can include:  fever/chills  sore throat  muscle aches  fatigue  cough  headache  runny or stuffy nose Flu can also lead to pneumonia and blood infections, and cause diarrhea and seizures in children. If you have a medical condition, such as heart or lung disease, flu can make it worse. Flu is more dangerous for some people. Infants and young children, people 47 years of age and older, pregnant women, and people with certain health conditions or a weakened immune system are at greatest risk. Each year thousands of  people in the [place] die from flu, and many more are hospitalized. Flu vaccine can:  keep you from getting flu,  make flu less severe if you do get it, and  keep you from spreading flu to your family and other people. 2. Inactivated and recombinant flu vaccines A dose of flu vaccine is recommended every flu season. Children 6 months through 20 years of age may need two doses during the same flu season. Everyone else needs only one dose each flu season. Some inactivated flu vaccines contain a very  small amount of a mercury-based preservative called thimerosal. Studies have not shown thimerosal in vaccines to be harmful, but flu vaccines that do not contain thimerosal are available. There is no live flu virus in flu shots. They cannot cause the flu. There are many flu viruses, and they are always changing. Each year a new flu vaccine is made to protect against three or four viruses that are likely to cause disease in the upcoming flu season. But even when the vaccine doesn't exactly match these viruses, it may still provide some protection. Flu vaccine cannot prevent:  flu that is caused by a virus not covered by the vaccine, or  illnesses that look like flu but are not. It takes about 2 weeks for protection to develop after vaccination, and protection lasts through the flu season. 3. Some people should not get this vaccine Tell the person who is giving you the vaccine:  If you have any severe, life-threatening allergies. If you ever had a life-threatening allergic reaction after a dose of flu vaccine, or have a severe allergy to any part of this vaccine, you may be advised not to get vaccinated. Most, but not all, types of flu vaccine contain a small amount of egg protein.  If you ever had Guillain-Barre Syndrome (also called GBS). Some people with a history of GBS should not get this vaccine. This should be discussed with your doctor.  If you are not feeling well. It is usually okay to get flu vaccine when you have a mild illness, but you might be asked to come back when you feel better. 4. Risks of a vaccine reaction With any medicine, including vaccines, there is a chance of reactions. These are usually mild and go away on their own, but serious reactions are also possible. Most people who get a flu shot do not have any problems with it. Minor problems following a flu shot include:  soreness, redness, or swelling where the shot was given  hoarseness  sore, red or itchy  eyes  cough  fever  aches  headache  itching  fatigue If these problems occur, they usually begin soon after the shot and last 1 or 2 days. More serious problems following a flu shot can include the following:  There may be a small increased risk of Guillain-Barre Syndrome (GBS) after inactivated flu vaccine. This risk has been estimated at 1 or 2 additional cases per million people vaccinated. This is much lower than the risk of severe complications from flu, which can be prevented by flu vaccine.  Young children who get the flu shot along with pneumococcal vaccine (PCV13) and/or DTaP vaccine at the same time might be slightly more likely to have a seizure caused by fever. Ask your doctor for more information. Tell your doctor if a child who is getting flu vaccine has ever had a seizure. Problems that could happen after any injected vaccine:  People sometimes faint after a  medical procedure, including vaccination. Sitting or lying down for about 15 minutes can help prevent fainting, and injuries caused by a fall. Tell your doctor if you feel dizzy, or have vision changes or ringing in the ears.  Some people get severe pain in the shoulder and have difficulty moving the arm where a shot was given. This happens very rarely.  Any medication can cause a severe allergic reaction. Such reactions from a vaccine are very rare, estimated at about 1 in a million doses, and would happen within a few minutes to a few hours after the vaccination. As with any medicine, there is a very remote chance of a vaccine causing a serious injury or death. The safety of vaccines is always being monitored. For more information, visit: http://www.aguilar.org/ 5. What if there is a serious reaction? What should I look for?  Look for anything that concerns you, such as signs of a severe allergic reaction, very high fever, or unusual behavior. Signs of a severe allergic reaction can include hives, swelling of  the face and throat, difficulty breathing, a fast heartbeat, dizziness, and weakness. These would start a few minutes to a few hours after the vaccination. What should I do?  If you think it is a severe allergic reaction or other emergency that can't wait, call 9-1-1 and get the person to the nearest hospital. Otherwise, call your doctor.  Reactions should be reported to the Vaccine Adverse Event Reporting System (VAERS). Your doctor should file this report, or you can do it yourself through the VAERS web site at www.vaers.SamedayNews.es, or by calling (539)434-4778. VAERS does not give medical advice. 6. The National Vaccine Injury Compensation Program The [employer] Vaccine Injury Compensation Program (VICP) is a federal program that was created to compensate people who may have been injured by certain vaccines. Persons who believe they may have been injured by a vaccine can learn about the program and about filing a claim by calling (661)296-3292 or visiting the [place] website at GoldCloset.com.ee. There is a time limit to file a claim for compensation. 7. How can I learn more?  Ask your healthcare provider. He or she can give you the vaccine package insert or suggest other sources of information.  Call your local or state health department.  Contact the Centers for Disease Control and Prevention (CDC):  Call (312)450-6334 (1-800-CDC-INFO) or  Visit CDC's website at https://gibson.com/ Vaccine Information Statement Inactivated Influenza Vaccine (05/31/2014)   This information is not intended to replace advice given to you by your health care provider. Make sure you discuss any questions you have with your health care provider.   Document Released: 08/05/2006 Document Revised: 11/01/2014 Document Reviewed: 06/03/2014 Elsevier Interactive Patient Education [employer].

## 2016-07-23 NOTE — Assessment & Plan Note (Signed)
Discussed.  She is on a keto diet now

## 2016-07-24 LAB — COMPREHENSIVE METABOLIC PANEL
A/G RATIO: 2.2 (ref 1.2–2.2)
ALT: 21 IU/L (ref 0–32)
AST: 21 IU/L (ref 0–40)
Albumin: 4.2 g/dL (ref 3.6–4.8)
Alkaline Phosphatase: 50 IU/L (ref 39–117)
BUN/Creatinine Ratio: 27 (ref 12–28)
BUN: 23 mg/dL (ref 8–27)
Bilirubin Total: 0.4 mg/dL (ref 0.0–1.2)
CALCIUM: 9.1 mg/dL (ref 8.7–10.3)
CO2: 24 mmol/L (ref 18–29)
Chloride: 102 mmol/L (ref 96–106)
Creatinine, Ser: 0.84 mg/dL (ref 0.57–1.00)
GFR calc Af Amer: 84 mL/min/{1.73_m2} (ref >59)
GFR, EST NON AFRICAN AMERICAN: 73 mL/min/{1.73_m2} (ref >59)
Globulin, Total: 1.9 g/dL (ref 1.5–4.5)
Glucose: 88 mg/dL (ref 65–99)
POTASSIUM: 4.5 mmol/L (ref 3.5–5.2)
Sodium: 139 mmol/L (ref 134–144)
Total Protein: 6.1 g/dL (ref 6.0–8.5)

## 2016-07-24 LAB — LIPID PANEL W/O CHOL/HDL RATIO
CHOLESTEROL TOTAL: 245 mg/dL — AB (ref 100–199)
HDL: 78 mg/dL (ref >39)
LDL CALC: 149 mg/dL — AB (ref 0–99)
Triglycerides: 89 mg/dL (ref 0–149)
VLDL CHOLESTEROL CAL: 18 mg/dL (ref 5–40)

## 2016-07-26 ENCOUNTER — Encounter: Payer: Self-pay | Admitting: Unknown Physician Specialty

## 2016-11-22 ENCOUNTER — Encounter: Payer: Self-pay | Admitting: Unknown Physician Specialty

## 2016-11-22 ENCOUNTER — Ambulatory Visit (INDEPENDENT_AMBULATORY_CARE_PROVIDER_SITE_OTHER): Payer: Medicare Other | Admitting: Unknown Physician Specialty

## 2016-11-22 VITALS — BP 119/79 | HR 71 | Temp 97.6°F | Wt 160.2 lb

## 2016-11-22 DIAGNOSIS — S300XXA Contusion of lower back and pelvis, initial encounter: Secondary | ICD-10-CM | POA: Diagnosis not present

## 2016-11-22 MED ORDER — MELOXICAM 15 MG PO TABS: 15.0000 mg | ORAL_TABLET | Freq: Every day | ORAL | 1 refills | Status: DC

## 2016-11-22 NOTE — Progress Notes (Signed)
   BP 119/79 (BP Location: Left Arm, Patient Position: Sitting, Cuff Size: Large)   Pulse 71   Temp 97.6 F (36.4 C)   Wt 160 lb 3.2 oz (72.7 kg)   LMP  (LMP Unknown)   SpO2 99%   BMI 29.02 kg/m    Subjective:    Patient ID: Breanna Davidson, female    DOB: [birth year], 66 y.o.   MRN: [id number]  HPI: Richardean Aydin is a 66 y.o. female  Chief Complaint  Patient presents with  . Tailbone Pain    pt states she had a fall about 2 weeks ago on the ice. States her tail bone has been hurting ever since and states she has been taking tylenol and advil   Pt states she "fell on my butt" and "everything is hurting.  She has broken her coccyx in the past.  She states she has buttock pain that radiates to the front.  She was hurting in the lower back but improved with massage.  She does have hemorrhoids as well.  States it's better when laying down completely.  It's best in the AM and progressively worse throughout the day.    Relevant past medical, surgical, family and social history reviewed and updated as indicated. Interim medical history since our last visit reviewed. Allergies and medications reviewed and updated.  Review of Systems  Per HPI unless specifically indicated above     Objective:    BP 119/79 (BP Location: Left Arm, Patient Position: Sitting, Cuff Size: Large)   Pulse 71   Temp 97.6 F (36.4 C)   Wt 160 lb 3.2 oz (72.7 kg)   LMP  (LMP Unknown)   SpO2 99%   BMI 29.02 kg/m   Wt Readings from Last 3 Encounters:  11/22/16 160 lb 3.2 oz (72.7 kg)  07/23/16 162 lb 12.8 oz (73.8 kg)  03/19/16 163 lb (73.9 kg)    Physical Exam  Constitutional: She is oriented to person, place, and time. She appears well-developed and well-nourished. No distress.  HENT:  Head: Normocephalic and atraumatic.  Eyes: Conjunctivae and lids are normal. Right eye exhibits no discharge. Left eye exhibits no discharge. No scleral icterus.  Cardiovascular: Normal rate.     Pulmonary/Chest: Effort normal.  Abdominal: Normal appearance. There is no splenomegaly or hepatomegaly.  Musculoskeletal: Normal range of motion.  Neurological: She is alert and oriented to person, place, and time.  Skin: Skin is intact. No rash noted. No pallor.  Psychiatric: She has a normal mood and affect. Her behavior is normal. Judgment and thought content normal.   No tenderness.    Assessment & Plan:   Problem List Items Addressed This Visit    None    Visit Diagnoses    Contusion of coccyx, initial encounter    -  Primary   Discussed care and trajectory.  I don't recommend and x-ray at this time especially with history of break.  Will wait around 2 weeks and refer to ortho if neede       Follow up plan: Return if symptoms worsen or fail to improve.

## 2016-12-14 DIAGNOSIS — D225 Melanocytic nevi of trunk: Secondary | ICD-10-CM | POA: Diagnosis not present

## 2017-02-28 ENCOUNTER — Encounter: Payer: Self-pay | Admitting: Obstetrics & Gynecology

## 2017-02-28 ENCOUNTER — Ambulatory Visit (INDEPENDENT_AMBULATORY_CARE_PROVIDER_SITE_OTHER): Payer: Medicare Other | Admitting: Obstetrics & Gynecology

## 2017-02-28 VITALS — BP 120/70 | HR 62 | Ht 62.0 in | Wt 156.0 lb

## 2017-02-28 DIAGNOSIS — N952 Postmenopausal atrophic vaginitis: Secondary | ICD-10-CM | POA: Diagnosis not present

## 2017-02-28 DIAGNOSIS — Z0000 Encounter for general adult medical examination without abnormal findings: Secondary | ICD-10-CM

## 2017-02-28 DIAGNOSIS — Z124 Encounter for screening for malignant neoplasm of cervix: Secondary | ICD-10-CM | POA: Diagnosis not present

## 2017-02-28 MED ORDER — PRASTERONE 6.5 MG VA INST: 6.5000 mg | VAGINAL_INSERT | Freq: Every day | VAGINAL | 11 refills | Status: DC

## 2017-02-28 NOTE — Progress Notes (Signed)
HPI:      [name] is a 66 y.o. [phone] who LMP was in the past, she presents today for her follow up to abnormal PAP in past (LEEP and CRYO in her 11s).  The patient has no complaints today. The patient is not sexually active due to vag pain and dryness (for 3 years). Herlast pap: was normal and 2016, and has been getting regularly due to past hx abn PAP.Marland Kitchen  The patient does perform self breast exams.  There is no notable family history of breast or ovarian cancer in her family. The patient is not taking hormone replacement therapy. Patient denies post-menopausal vaginal bleeding.   The patient has regular exercise: yes. The patient denies current symptoms of depression.    PMHx: Past Medical History:  Diagnosis Date  . Allergy   . Hyperlipidemia   . Osteopenia    Past Surgical History:  Procedure Laterality Date  . APPENDECTOMY    . CESAREAN SECTION    . DILATION AND CURETTAGE OF UTERUS    . REDUCTION MAMMAPLASTY  1988   Family History  Problem Relation Age of Onset  . Breast cancer Mother 45    and again at 63  . Lung disease Mother   . Cancer Mother   . Emphysema Mother   . Breast cancer Maternal Grandmother   . CAD Father   . Hyperlipidemia Sister   . Mental illness Daughter     bipolar  . Cystic fibrosis Daughter   . Breast cancer Maternal Aunt    Social History  Substance Use Topics  . Smoking status: Never Smoker  . Smokeless tobacco: Never Used  . Alcohol use 0.6 - 1.2 oz/week    1 - 2 Glasses of wine per week    Current Outpatient Prescriptions:  .  Calcium-Magnesium-Vitamin D (CALCIUM MAGNESIUM PO), Take by mouth daily., Disp: , Rfl:  .  cholecalciferol (VITAMIN D) 1000 UNITS tablet, Take 2,000 Units by mouth daily., Disp: , Rfl:  .  Coenzyme Q10 (CO Q 10) 100 MG CAPS, Take 100 mg by mouth daily., Disp: , Rfl:  .  fluticasone (FLONASE) 50 MCG/ACT nasal spray, Place 2 sprays into both nostrils daily., Disp: , Rfl:  .  meloxicam (MOBIC) 15 MG  tablet, Take 1 tablet (15 mg total) by mouth daily., Disp: 30 tablet, Rfl: 1 .  Omega-3 Fatty Acids (FISH OIL) 1000 MG CAPS, Take by mouth daily., Disp: , Rfl:  .  Prasterone (INTRAROSA) 6.5 MG INST, Place 6.5 mg vaginally at bedtime., Disp: 30 each, Rfl: 11 Allergies: Prednisone  Review of Systems  Constitutional: Negative for chills, fever and malaise/fatigue.  HENT: Negative for congestion, sinus pain and sore throat.   Eyes: Negative for blurred vision and pain.  Respiratory: Negative for cough and wheezing.   Cardiovascular: Negative for chest pain and leg swelling.  Gastrointestinal: Negative for abdominal pain, constipation, diarrhea, heartburn, nausea and vomiting.  Genitourinary: Negative for dysuria, frequency, hematuria and urgency.  Musculoskeletal: Negative for back pain, joint pain, myalgias and neck pain.  Skin: Negative for itching and rash.  Neurological: Negative for dizziness, tremors and weakness.  Endo/Heme/Allergies: Does not bruise/bleed easily.  Psychiatric/Behavioral: Negative for depression. The patient is not nervous/anxious and does not have insomnia.     Objective: BP 120/70   Pulse 62   Ht 5\' 2"  (1.575 m)   Wt 156 lb (70.8 kg)   LMP  (LMP Unknown)   BMI 28.53 kg/m   [employer]  02/28/17 1556  Weight: 156 lb (70.8 kg)   Body mass index is 28.53 kg/m. Physical Exam  Constitutional: She is oriented to person, place, and time. She appears well-developed and well-nourished. No distress.  Genitourinary: Rectum normal, vagina normal and uterus normal. Pelvic exam was performed with patient supine. There is no rash or lesion on the right labia. There is no rash or lesion on the left labia. Vagina exhibits no lesion. No bleeding in the vagina. Right adnexum does not display mass and does not display tenderness. Left adnexum does not display mass and does not display tenderness. Cervix does not exhibit motion tenderness, lesion, friability or polyp.    Uterus is mobile and midaxial. Uterus is not enlarged or exhibiting a mass.  Genitourinary Comments: Cx flush with vag apex, no lesions Atrophy present  HENT:  Head: Normocephalic and atraumatic. Head is without laceration.  Right Ear: Hearing normal.  Left Ear: Hearing normal.  Nose: No epistaxis.  No foreign bodies.  Mouth/Throat: Uvula is midline, oropharynx is clear and moist and mucous membranes are normal.  Eyes: Pupils are equal, round, and reactive to light.  Neck: Normal range of motion. Neck supple. No thyromegaly present.  Cardiovascular: Normal rate and regular rhythm.  Exam reveals no gallop and no friction rub.   No murmur heard. Pulmonary/Chest: Effort normal and breath sounds normal. No respiratory distress. She has no wheezes. Right breast exhibits no mass, no skin change and no tenderness. Left breast exhibits no mass, no skin change and no tenderness.  Abdominal: Soft. Bowel sounds are normal. She exhibits no distension. There is no tenderness. There is no rebound.  Musculoskeletal: Normal range of motion.  Neurological: She is alert and oriented to person, place, and time. No cranial nerve deficit.  Skin: Skin is warm and dry.  Psychiatric: She has a normal mood and affect. Judgment normal.  Vitals reviewed.   Assessment: Annual Exam 1. Annual physical exam   2. Screening for cervical cancer   3. Vaginal atrophy     Plan:            1.  Cervical Screening-  Pap smear done today, Pap smear schedule reviewed with patient, every 2 years  2. Breast screening- Exam annually and mammogram scheduled  3. Colonoscopy every 10 years, Hemoccult testing after age 20  4. Labs managed by PCP  5. Counseling for hormonal therapy: none; Plan Intrarosa (a DHEAS supplement) for vag atrophy and dyspareunia. Pros and cons and info provided.     F/U  Return in about 1 year (around 02/28/2018) for Follow up.  Barnett Applebaum, MD, [facility], [facility] 02/28/2017  4:26 PM

## 2017-02-28 NOTE — Patient Instructions (Signed)
Prasterone vaginal insert What is this medicine? PRASTERONE (PRAS ter one), also known as DEHYDROEPIANDROSTERONE (DHEA) is used to treat females who experience painful sexual intercourse, a symptom of menopause that occurs due to changes in and around the vagina. This medicine may be used for other purposes; ask your health care provider or pharmacist if you have questions. COMMON BRAND NAME(S): INTRAROSA What should I tell my health care provider before I take this medicine? They need to know if you have any of these conditions: -cancer, such as breast, uterine, or other cancer -history of vaginal bleeding -an unusual or allergic reaction to prasterone, DHEA, other hormones, medicines, foods, dyes, or preservatives -pregnant or trying to get pregnant -breast-feeding How should I use this medicine? This medicine is for vaginal use only. Do not take by mouth. Follow the directions on the prescription label. Read package directions carefully before using. Wash hands before and after use. Use this medicine at bedtime. Do not use it more often than directed. Do not stop using except on your doctor's advice. Talk to your pediatrician regarding the use of this medicine in children. This medicine is not approved for use in children. Overdosage: If you think you have taken too much of this medicine contact a poison control center or emergency room at once. NOTE: This medicine is only for you. Do not share this medicine with others. What if I miss a dose? If you miss a dose, use it as soon as you can. If it is almost time for your next dose, use only that dose. Do not use double or extra doses. What may interact with this medicine? Interactions are not expected. Do not use any other vaginal products without telling your doctor or health care professional. This list may not describe all possible interactions. Give your health care provider a list of all the medicines, herbs, non-prescription drugs, or  dietary supplements you use. Also tell them if you smoke, drink alcohol, or use illegal drugs. Some items may interact with your medicine. What should I watch for while using this medicine? Visit your doctor or health care professional for a regular check on your progress. This medicine may cause changes on a cervical Pap smear. You will receive regular pelvic exams. What side effects may I notice from receiving this medicine? Side effects that you should report to your doctor or health care professional as soon as possible: -allergic reactions like skin rash, itching or hives Side effects that usually do not require medical attention (report to your doctor or health care professional if they continue or are bothersome): -vaginal discharge This list may not describe all possible side effects. Call your doctor for medical advice about side effects. You may report side effects to FDA at 1-800-FDA-1088. Where should I keep my medicine? Keep out of the reach of children. Store at room temperature or in a refrigerator between 5 and 30 degrees C (41 and 86 degrees F). Throw away any unused medicine after the expiration date. NOTE: This sheet is a summary. It may not cover all possible information. If you have questions about this medicine, talk to your doctor, pharmacist, or health care provider.  2018 Elsevier/Gold Standard (2016-04-06 12:30:18)  

## 2017-03-03 LAB — IGP, APTIMA HPV
HPV Aptima: NEGATIVE
PAP Smear Comment: 0

## 2017-06-23 ENCOUNTER — Ambulatory Visit (INDEPENDENT_AMBULATORY_CARE_PROVIDER_SITE_OTHER): Payer: Medicare Other

## 2017-06-23 VITALS — BP 115/74 | HR 70 | Temp 98.0°F | Resp 16 | Ht 62.0 in | Wt 147.1 lb

## 2017-06-23 DIAGNOSIS — Z0000 Encounter for general adult medical examination without abnormal findings: Secondary | ICD-10-CM | POA: Diagnosis not present

## 2017-06-23 DIAGNOSIS — Z1231 Encounter for screening mammogram for malignant neoplasm of breast: Secondary | ICD-10-CM

## 2017-06-23 DIAGNOSIS — Z1239 Encounter for other screening for malignant neoplasm of breast: Secondary | ICD-10-CM

## 2017-06-23 NOTE — Patient Instructions (Addendum)
Breanna Davidson , Thank you for taking time to come for your Medicare Wellness Visit. I appreciate your ongoing commitment to your health goals. Please review the following plan we discussed and let me know if I can assist you in the future.   Screening recommendations/referrals: Colonoscopy: completed 06/11/2010 Mammogram: completed 07/06/2016 Bone Density: completed 07/06/2016 Recommended yearly ophthalmology/optometry visit for glaucoma screening and checkup Recommended yearly dental visit for hygiene and checkup  Vaccinations: Influenza vaccine: up to date, due 06/2017 Pneumococcal vaccine: due 07/2017 Tdap vaccine: due now,check with your insurance company for coverage  Shingles vaccine: up to date   Advanced directives: Please bring a copy of your health care power of attorney and living will to the office at your convenience.  Conditions/risks identified: none  Next appointment: Follow up on 07/26/2017 at 8:00am with Regino Schultze. Follow up in one year for your annual wellness exam.    Preventive Care 65 Years and Older, Female Preventive care refers to lifestyle choices and visits with your health care provider that can promote health and wellness. What does preventive care include?  A yearly physical exam. This is also called an annual well check.  Dental exams once or twice a year.  Routine eye exams. Ask your health care provider how often you should have your eyes checked.  Personal lifestyle choices, including:  Daily care of your teeth and gums.  Regular physical activity.  Eating a healthy diet.  Avoiding tobacco and drug use.  Limiting alcohol use.  Practicing safe sex.  Taking low-dose aspirin every day.  Taking vitamin and mineral supplements as recommended by your health care provider. What happens during an annual well check? The services and screenings done by your health care provider during your annual well check will depend on your age, overall  health, lifestyle risk factors, and family history of disease. Counseling  Your health care provider may ask you questions about your:  Alcohol use.  Tobacco use.  Drug use.  Emotional well-being.  Home and relationship well-being.  Sexual activity.  Eating habits.  History of falls.  Memory and ability to understand (cognition).  Work and work Statistician.  Reproductive health. Screening  You may have the following tests or measurements:  Height, weight, and BMI.  Blood pressure.  Lipid and cholesterol levels. These may be checked every 5 years, or more frequently if you are over 55 years old.  Skin check.  Lung cancer screening. You may have this screening every year starting at age 21 if you have a 30-pack-year history of smoking and currently smoke or have quit within the past 15 years.  Fecal occult blood test (FOBT) of the stool. You may have this test every year starting at age 72.  Flexible sigmoidoscopy or colonoscopy. You may have a sigmoidoscopy every 5 years or a colonoscopy every 10 years starting at age 15.  Hepatitis C blood test.  Hepatitis B blood test.  Sexually transmitted disease (STD) testing.  Diabetes screening. This is done by checking your blood sugar (glucose) after you have not eaten for a while (fasting). You may have this done every 1-3 years.  Bone density scan. This is done to screen for osteoporosis. You may have this done starting at age 1.  Mammogram. This may be done every 1-2 years. Talk to your health care provider about how often you should have regular mammograms. Talk with your health care provider about your test results, treatment options, and if necessary, the need for more tests.  Vaccines  Your health care provider may recommend certain vaccines, such as:  Influenza vaccine. This is recommended every year.  Tetanus, diphtheria, and acellular pertussis (Tdap, Td) vaccine. You may need a Td booster every 10  years.  Zoster vaccine. You may need this after age 11.  Pneumococcal 13-valent conjugate (PCV13) vaccine. One dose is recommended after age 54.  Pneumococcal polysaccharide (PPSV23) vaccine. One dose is recommended after age 33. Talk to your health care provider about which screenings and vaccines you need and how often you need them. This information is not intended to replace advice given to you by your health care provider. Make sure you discuss any questions you have with your health care provider. Document Released: 11/07/2015 Document Revised: 06/30/2016 Document Reviewed: 08/12/2015 Elsevier Interactive Patient Education  2017 [employer] Prevention in the Home Falls can cause injuries. They can happen to people of all ages. There are many things you can do to make your home safe and to help prevent falls. What can I do on the outside of my home?  Regularly fix the edges of walkways and driveways and fix any cracks.  Remove anything that might make you trip as you walk through a door, such as a raised step or threshold.  Trim any bushes or trees on the path to your home.  Use bright outdoor lighting.  Clear any walking paths of anything that might make someone trip, such as rocks or tools.  Regularly check to see if handrails are loose or broken. Make sure that both sides of any steps have handrails.  Any raised decks and porches should have guardrails on the edges.  Have any leaves, snow, or ice cleared regularly.  Use sand or salt on walking paths during winter.  Clean up any spills in your garage right away. This includes oil or grease spills. What can I do in the bathroom?  Use night lights.  Install grab bars by the toilet and in the tub and shower. Do not use towel bars as grab bars.  Use non-skid mats or decals in the tub or shower.  If you need to sit down in the shower, use a plastic, non-slip stool.  Keep the floor dry. Clean up any water that  spills on the floor as soon as it happens.  Remove soap buildup in the tub or shower regularly.  Attach bath mats securely with double-sided non-slip rug tape.  Do not have throw rugs and other things on the floor that can make you trip. What can I do in the bedroom?  Use night lights.  Make sure that you have a light by your bed that is easy to reach.  Do not use any sheets or blankets that are too big for your bed. They should not hang down onto the floor.  Have a firm chair that has side arms. You can use this for support while you get dressed.  Do not have throw rugs and other things on the floor that can make you trip. What can I do in the kitchen?  Clean up any spills right away.  Avoid walking on wet floors.  Keep items that you use a lot in easy-to-reach places.  If you need to reach something above you, use a strong step stool that has a grab bar.  Keep electrical cords out of the way.  Do not use floor polish or wax that makes floors slippery. If you must use wax, use non-skid floor wax.  Do not have throw rugs and other things on the floor that can make you trip. What can I do with my stairs?  Do not leave any items on the stairs.  Make sure that there are handrails on both sides of the stairs and use them. Fix handrails that are broken or loose. Make sure that handrails are as long as the stairways.  Check any carpeting to make sure that it is firmly attached to the stairs. Fix any carpet that is loose or worn.  Avoid having throw rugs at the top or bottom of the stairs. If you do have throw rugs, attach them to the floor with carpet tape.  Make sure that you have a light switch at the top of the stairs and the bottom of the stairs. If you do not have them, ask someone to add them for you. What else can I do to help prevent falls?  Wear shoes that:  Do not have high heels.  Have rubber bottoms.  Are comfortable and fit you well.  Are closed at the  toe. Do not wear sandals.  If you use a stepladder:  Make sure that it is fully opened. Do not climb a closed stepladder.  Make sure that both sides of the stepladder are locked into place.  Ask someone to hold it for you, if possible.  Clearly mark and make sure that you can see:  Any grab bars or handrails.  First and last steps.  Where the edge of each step is.  Use tools that help you move around (mobility aids) if they are needed. These include:  Canes.  Walkers.  Scooters.  Crutches.  Turn on the lights when you go into a dark area. Replace any light bulbs as soon as they burn out.  Set up your furniture so you have a clear path. Avoid moving your furniture around.  If any of your floors are uneven, fix them.  If there are any pets around you, be aware of where they are.  Review your medicines with your doctor. Some medicines can make you feel dizzy. This can increase your chance of falling. Ask your doctor what other things that you can do to help prevent falls. This information is not intended to replace advice given to you by your health care provider. Make sure you discuss any questions you have with your health care provider. Document Released: 08/07/2009 Document Revised: 03/18/2016 Document Reviewed: 11/15/2014 Elsevier Interactive Patient Education  2017 [employer].

## 2017-06-23 NOTE — Progress Notes (Signed)
Subjective:   Breanna Davidson is a 66 y.o. female who presents for Medicare Annual (Subsequent) preventive examination.  Review of Systems:  Cardiac Risk Factors include: advanced age (>58men, >20 women);dyslipidemia     Objective:     Vitals: BP 115/74 (BP Location: Left Arm, Patient Position: Sitting)   Pulse 70   Temp 98 F (36.7 C)   Resp 16   Ht 5\' 2"  (1.575 m)   Wt 147 lb 1.6 oz (66.7 kg)   LMP  (LMP Unknown)   BMI 26.90 kg/m   Body mass index is 26.9 kg/m.   Tobacco History  Smoking Status  . Never Smoker  Smokeless Tobacco  . Never Used     Counseling given: Not Answered   Past Medical History:  Diagnosis Date  . Allergy   . Hyperlipidemia   . Osteopenia    Past Surgical History:  Procedure Laterality Date  . APPENDECTOMY    . CESAREAN SECTION    . DILATION AND CURETTAGE OF UTERUS    . REDUCTION MAMMAPLASTY  1988   Family History  Problem Relation Age of Onset  . Breast cancer Mother 67       and again at 52  . Lung disease Mother   . Cancer Mother   . Emphysema Mother   . Breast cancer Maternal Grandmother   . CAD Father   . Hyperlipidemia Sister   . Mental illness Daughter        bipolar  . Cystic fibrosis Daughter   . Breast cancer Maternal Aunt    History  Sexual Activity  . Sexual activity: Yes    Outpatient Encounter Prescriptions as of 06/23/2017  Medication Sig  . Calcium-Magnesium-Vitamin D (CALCIUM MAGNESIUM PO) Take by mouth daily.  . cholecalciferol (VITAMIN D) 1000 UNITS tablet Take 2,000 Units by mouth daily.  . Coenzyme Q10 (CO Q 10) 100 MG CAPS Take 100 mg by mouth daily.  . fluticasone (FLONASE) 50 MCG/ACT nasal spray Place 2 sprays into both nostrils daily.  . Omega-3 Fatty Acids (FISH OIL) 1000 MG CAPS Take by mouth daily.  . [DISCONTINUED] meloxicam (MOBIC) 15 MG tablet Take 1 tablet (15 mg total) by mouth daily. (Patient not taking: Reported on 06/23/2017)  . [DISCONTINUED] Prasterone (INTRAROSA) 6.5 MG INST  Place 6.5 mg vaginally at bedtime. (Patient not taking: Reported on 06/23/2017)   No facility-administered encounter medications on file as of 06/23/2017.     Activities of Daily Living In your present state of health, do you have any difficulty performing the following activities: 06/23/2017 07/23/2016  Hearing? N N  Vision? N N  Difficulty concentrating or making decisions? N N  Walking or climbing stairs? N N  Dressing or bathing? N N  Doing errands, shopping? N N  Preparing Food and eating ? N -  Using the Toilet? N -  In the past six months, have you accidently leaked urine? N -  Do you have problems with loss of bowel control? N -  Managing your Medications? N -  Managing your Finances? N -  Housekeeping or managing your Housekeeping? N -  Some recent data might be hidden    Patient Care Team: Kathrine Haddock, NP as PCP - General (Nurse Practitioner)    Assessment:     Exercise Activities and Dietary recommendations Current Exercise Habits: Home exercise routine, Type of exercise: walking, Time (Minutes): 45, Frequency (Times/Week): 7, Weekly Exercise (Minutes/Week): 315, Intensity: Mild, Exercise limited by: None identified  Goals  None     Fall Risk Fall Risk  06/23/2017 07/23/2016 03/19/2016  Falls in the past year? No No No   Depression Screen PHQ 2/9 Scores 06/23/2017 07/23/2016 03/19/2016 09/12/2015  PHQ - 2 Score 0 0 0 0  PHQ- 9 Score - 1 - -     Cognitive Function     6CIT Screen 06/23/2017  What Year? 0 points  What month? 0 points  What time? 0 points  Count back from 20 0 points  Months in reverse 0 points  Repeat phrase 0 points  Total Score 0    Immunization History  Administered Date(s) Administered  . Influenza, High Dose Seasonal PF 07/23/2016  . Pneumococcal Conjugate-13 07/23/2016  . Td 11/13/2004  . Zoster 09/12/2015   Screening Tests Health Maintenance  Topic Date Due  . TETANUS/TDAP  11/13/2014  . INFLUENZA VACCINE  05/25/2017  .  PNA vac Low Risk Adult (2 of 2 - PPSV23) 07/23/2017  . MAMMOGRAM  07/06/2018  . COLONOSCOPY  06/04/2020  . DEXA SCAN  Completed  . Hepatitis C Screening  Completed      Plan:     I have personally reviewed and addressed the Medicare Annual Wellness questionnaire and have noted the following in the patient's chart:  A. Medical and social history B. Use of alcohol, tobacco or illicit drugs  C. Current medications and supplements D. Functional ability and status E.  Nutritional status F.  Physical activity G. Advance directives H. List of other physicians I.  Hospitalizations, surgeries, and ER visits in previous 12 months J.  [place] such as hearing and vision if needed, cognitive and depression L. Referrals and appointments   In addition, I have reviewed and discussed with patient certain preventive protocols, quality metrics, and best practice recommendations. A written personalized care plan for preventive services as well as general preventive health recommendations were provided to [email or web address].   Signed,  Tyler Aas, LPN Nurse Health Advisor   MD Recommendations: none

## 2017-07-08 ENCOUNTER — Ambulatory Visit
Admission: RE | Admit: 2017-07-08 | Discharge: 2017-07-08 | Disposition: A | Discharge status: Home / Self Care | Payer: Medicare Other | Source: Ambulatory Visit | Attending: Unknown Physician Specialty | Admitting: Unknown Physician Specialty

## 2017-07-08 DIAGNOSIS — Z1239 Encounter for other screening for malignant neoplasm of breast: Secondary | ICD-10-CM

## 2017-07-08 DIAGNOSIS — Z1231 Encounter for screening mammogram for malignant neoplasm of breast: Secondary | ICD-10-CM | POA: Diagnosis not present

## 2017-07-26 ENCOUNTER — Encounter: Payer: Self-pay | Admitting: Unknown Physician Specialty

## 2017-07-26 ENCOUNTER — Ambulatory Visit (INDEPENDENT_AMBULATORY_CARE_PROVIDER_SITE_OTHER): Payer: Medicare Other | Admitting: Unknown Physician Specialty

## 2017-07-26 VITALS — BP 111/72 | HR 66 | Temp 98.5°F | Ht 62.0 in | Wt 149.0 lb

## 2017-07-26 DIAGNOSIS — M25552 Pain in left hip: Secondary | ICD-10-CM | POA: Diagnosis not present

## 2017-07-26 DIAGNOSIS — Z23 Encounter for immunization: Secondary | ICD-10-CM | POA: Diagnosis not present

## 2017-07-26 DIAGNOSIS — E7800 Pure hypercholesterolemia, unspecified: Secondary | ICD-10-CM | POA: Diagnosis not present

## 2017-07-26 DIAGNOSIS — Z0000 Encounter for general adult medical examination without abnormal findings: Secondary | ICD-10-CM

## 2017-07-26 NOTE — Patient Instructions (Addendum)
Pneumococcal Polysaccharide Vaccine: What You Need to Know 1. Why get vaccinated? Vaccination can protect older adults (and some children and younger adults) from pneumococcal disease. Pneumococcal disease is caused by bacteria that can spread from person to person through close contact. It can cause ear infections, and it can also lead to more serious infections of the:  Lungs (pneumonia),  Blood (bacteremia), and  Covering of the brain and spinal cord (meningitis). Meningitis can cause deafness and brain damage, and it can be fatal.  Anyone can get pneumococcal disease, but children under 52 years of age, people with certain medical conditions, adults over 104 years of age, and cigarette smokers are at the highest risk. About 18,000 older adults die each year from pneumococcal disease in the [place]. Treatment of pneumococcal infections with penicillin and other drugs used to be more effective. But some strains of the disease have become resistant to these drugs. This makes prevention of the disease, through vaccination, even more important. 2. Pneumococcal polysaccharide vaccine (PPSV23) Pneumococcal polysaccharide vaccine (PPSV23) protects against 23 types of pneumococcal bacteria. It will not prevent all pneumococcal disease. PPSV23 is recommended for:  All adults 31 years of age and older,  Anyone 2 through 66 years of age with certain long-term health problems,  Anyone 2 through 66 years of age with a weakened immune system,  Adults 35 through 66 years of age who smoke cigarettes or have asthma.  Most people need only one dose of PPSV. A second dose is recommended for certain high-risk groups. People 73 and older should get a dose even if they have gotten one or more doses of the vaccine before they turned 65. Your healthcare provider can give you more information about these recommendations. Most healthy adults develop protection within 2 to 3 weeks of getting the shot. 3.  Some people should not get this vaccine  Anyone who has had a life-threatening allergic reaction to PPSV should not get another dose.  Anyone who has a severe allergy to any component of PPSV should not receive it. Tell your provider if you have any severe allergies.  Anyone who is moderately or severely ill when the shot is scheduled may be asked to wait until they recover before getting the vaccine. Someone with a mild illness can usually be vaccinated.  Children less than 64 years of age should not receive this vaccine.  There is no evidence that PPSV is harmful to either a pregnant woman or to her fetus. However, as a precaution, women who need the vaccine should be vaccinated before becoming pregnant, if possible. 4. Risks of a vaccine reaction With any medicine, including vaccines, there is a chance of side effects. These are usually mild and go away on their own, but serious reactions are also possible. About half of people who get PPSV have mild side effects, such as redness or pain where the shot is given, which go away within about two days. Less than 1 out of 100 people develop a fever, muscle aches, or more severe local reactions. Problems that could happen after any vaccine:  People sometimes faint after a medical procedure, including vaccination. Sitting or lying down for about 15 minutes can help prevent fainting, and injuries caused by a fall. Tell your doctor if you feel dizzy, or have vision changes or ringing in the ears.  Some people get severe pain in the shoulder and have difficulty moving the arm where a shot was given. This happens very rarely.  Any  medication can cause a severe allergic reaction. Such reactions from a vaccine are very rare, estimated at about 1 in a million doses, and would happen within a few minutes to a few hours after the vaccination. As with any medicine, there is a very remote chance of a vaccine causing a serious injury or death. The safety of  vaccines is always being monitored. For more information, visit: http://www.aguilar.org/ 5. What if there is a serious reaction? What should I look for? Look for anything that concerns you, such as signs of a severe allergic reaction, very high fever, or unusual behavior. Signs of a severe allergic reaction can include hives, swelling of the face and throat, difficulty breathing, a fast heartbeat, dizziness, and weakness. These would usually start a few minutes to a few hours after the vaccination. What should I do? If you think it is a severe allergic reaction or other emergency that can't wait, call 9-1-1 or get to the nearest hospital. Otherwise, call your doctor. Afterward, the reaction should be reported to the Vaccine Adverse Event Reporting System (VAERS). Your doctor might file this report, or you can do it yourself through the VAERS web site at www.vaers.SamedayNews.es, or by calling 873-168-4603. VAERS does not give medical advice. 6. How can I learn more?  Ask your doctor. He or she can give you the vaccine package insert or suggest other sources of information.  Call your local or state health department.  Contact the Centers for Disease Control and Prevention (CDC): ? Call (712)740-2354 (1-800-CDC-INFO) or ? Visit CDC's website at http://hunter.com/ CDC Pneumococcal Polysaccharide Vaccine VIS (02/15/14) This information is not intended to replace advice given to you by your health care provider. Make sure you discuss any questions you have with your health care provider. Document Released: 08/08/2006 Document Revised: 07/01/2016 Document Reviewed: 07/01/2016 Elsevier Interactive Patient Education  2017 [place]. Influenza (Flu) Vaccine (Inactivated or Recombinant): What You Need to Know 1. Why get vaccinated? Influenza ("flu") is a contagious disease that spreads around the [place] every year, usually between October and May. Flu is caused by influenza viruses, and is  spread mainly by coughing, sneezing, and close contact. Anyone can get flu. Flu strikes suddenly and can last several days. Symptoms vary by age, but can include:  fever/chills  sore throat  muscle aches  fatigue  cough  headache  runny or stuffy nose  Flu can also lead to pneumonia and blood infections, and cause diarrhea and seizures in children. If you have a medical condition, such as heart or lung disease, flu can make it worse. Flu is more dangerous for some people. Infants and young children, people 61 years of age and older, pregnant women, and people with certain health conditions or a weakened immune system are at greatest risk. Each year thousands of people in the [place] die from flu, and many more are hospitalized. Flu vaccine can:  keep you from getting flu,  make flu less severe if you do get it, and  keep you from spreading flu to your family and other people. 2. Inactivated and recombinant flu vaccines A dose of flu vaccine is recommended every flu season. Children 6 months through 75 years of age may need two doses during the same flu season. Everyone else needs only one dose each flu season. Some inactivated flu vaccines contain a very small amount of a mercury-based preservative called thimerosal. Studies have not shown thimerosal in vaccines to be harmful, but flu vaccines that do  not contain thimerosal are available. There is no live flu virus in flu shots. They cannot cause the flu. There are many flu viruses, and they are always changing. Each year a new flu vaccine is made to protect against three or four viruses that are likely to cause disease in the upcoming flu season. But even when the vaccine doesn't exactly match these viruses, it may still provide some protection. Flu vaccine cannot prevent:  flu that is caused by a virus not covered by the vaccine, or  illnesses that look like flu but are not.  It takes about 2 weeks for protection to  develop after vaccination, and protection lasts through the flu season. 3. Some people should not get this vaccine Tell the person who is giving you the vaccine:  If you have any severe, life-threatening allergies. If you ever had a life-threatening allergic reaction after a dose of flu vaccine, or have a severe allergy to any part of this vaccine, you may be advised not to get vaccinated. Most, but not all, types of flu vaccine contain a small amount of egg protein.  If you ever had Guillain-Barr Syndrome (also called GBS). Some people with a history of GBS should not get this vaccine. This should be discussed with your doctor.  If you are not feeling well. It is usually okay to get flu vaccine when you have a mild illness, but you might be asked to come back when you feel better.  4. Risks of a vaccine reaction With any medicine, including vaccines, there is a chance of reactions. These are usually mild and go away on their own, but serious reactions are also possible. Most people who get a flu shot do not have any problems with it. Minor problems following a flu shot include:  soreness, redness, or swelling where the shot was given  hoarseness  sore, red or itchy eyes  cough  fever  aches  headache  itching  fatigue  If these problems occur, they usually begin soon after the shot and last 1 or 2 days. More serious problems following a flu shot can include the following:  There may be a small increased risk of Guillain-Barre Syndrome (GBS) after inactivated flu vaccine. This risk has been estimated at 1 or 2 additional cases per million people vaccinated. This is much lower than the risk of severe complications from flu, which can be prevented by flu vaccine.  Young children who get the flu shot along with pneumococcal vaccine (PCV13) and/or DTaP vaccine at the same time might be slightly more likely to have a seizure caused by fever. Ask your doctor for more information.  Tell your doctor if a child who is getting flu vaccine has ever had a seizure.  Problems that could happen after any injected vaccine:  People sometimes faint after a medical procedure, including vaccination. Sitting or lying down for about 15 minutes can help prevent fainting, and injuries caused by a fall. Tell your doctor if you feel dizzy, or have vision changes or ringing in the ears.  Some people get severe pain in the shoulder and have difficulty moving the arm where a shot was given. This happens very rarely.  Any medication can cause a severe allergic reaction. Such reactions from a vaccine are very rare, estimated at about 1 in a million doses, and would happen within a few minutes to a few hours after the vaccination. As with any medicine, there is a very remote chance of a  vaccine causing a serious injury or death. The safety of vaccines is always being monitored. For more information, visit: www.cdc.gov/vaccinesafety/ 5. What if there is a serious reaction? What should I look for? Look for anything that concerns you, such as signs of a severe allergic reaction, very high fever, or unusual behavior. Signs of a severe allergic reaction can include hives, swelling of the face and throat, difficulty breathing, a fast heartbeat, dizziness, and weakness. These would start a few minutes to a few hours after the vaccination. What should I do?  If you think it is a severe allergic reaction or other emergency that can't wait, call 9-1-1 and get the person to the nearest hospital. Otherwise, call your doctor.  Reactions should be reported to the Vaccine Adverse Event Reporting System (VAERS). Your doctor should file this report, or you can do it yourself through the VAERS web site at www.vaers.hhs.gov, or by calling 1-800-822-7967. ? VAERS does not give medical advice. 6. The National Vaccine Injury Compensation Program The National Vaccine Injury Compensation Program (VICP) is a federal  program that was created to compensate people who may have been injured by certain vaccines. Persons who believe they may have been injured by a vaccine can learn about the program and about filing a claim by calling 1-800-338-2382 or visiting the VICP website at www.hrsa.gov/vaccinecompensation. There is a time limit to file a claim for compensation. 7. How can I learn more?  Ask your healthcare provider. He or she can give you the vaccine package insert or suggest other sources of information.  Call your local or state health department.  Contact the Centers for Disease Control and Prevention (CDC): ? Call 1-800-232-4636 (1-800-CDC-INFO) or ? Visit CDC's website at www.cdc.gov/flu Vaccine Information Statement, Inactivated Influenza Vaccine (05/31/2014) This information is not intended to replace advice given to you by your health care provider. Make sure you discuss any questions you have with your health care provider. Document Released: 08/05/2006 Document Revised: 07/01/2016 Document Reviewed: 07/01/2016 Elsevier Interactive Patient Education  2017 [employer] Inc.  

## 2017-07-26 NOTE — Progress Notes (Signed)
BP 111/72   Pulse 66   Temp 98.5 F (36.9 C)   Ht 5\' 2"  (1.575 m)   Wt 149 lb (67.6 kg)   LMP  (LMP Unknown)   SpO2 99%   BMI 27.25 kg/m    Subjective:    Patient ID: Breanna Davidson, female    DOB: 05/17/1951, 66 y.o.   MRN: [id number]  HPI: Breanna Davidson is a 66 y.o. female  Chief Complaint  Patient presents with  . Annual Exam    pt had wellness exam 06/23/17 with [facility]   Pt doing well without complaints.  On a keto diet.  Having some left groin pain when she walks.    Social History   Social History  . Marital status: Married    Spouse name: N/A  . Number of children: N/A  . Years of education: N/A   Occupational History  . Not on file.   Social History Main Topics  . Smoking status: Never Smoker  . Smokeless tobacco: Never Used  . Alcohol use 0.6 - 1.2 oz/week    1 - 2 Glasses of wine per week  . Drug use: No  . Sexual activity: Yes   Other Topics Concern  . Not on file   Social History Narrative  . No narrative on file   Family History  Problem Relation Age of Onset  . Breast cancer Mother 89       and again at 25  . Lung disease Mother   . Cancer Mother   . Emphysema Mother   . Breast cancer Maternal Grandmother   . CAD Father   . Hyperlipidemia Sister   . Mental illness Daughter        bipolar  . Cystic fibrosis Daughter   . Breast cancer Maternal Aunt    Past Medical History:  Diagnosis Date  . Allergy   . Hyperlipidemia   . Osteopenia    Past Surgical History:  Procedure Laterality Date  . APPENDECTOMY    . CESAREAN SECTION    . DILATION AND CURETTAGE OF UTERUS    . REDUCTION MAMMAPLASTY  1988   Relevant past medical, surgical, family and social history reviewed and updated as indicated. Interim medical history since our last visit reviewed. Allergies and medications reviewed and updated.  Review of Systems  Constitutional: Negative.   HENT: Negative.   Eyes: Negative.   Respiratory: Negative.   Cardiovascular:  Negative.   Gastrointestinal: Negative.   Endocrine: Negative.   Genitourinary: Negative.   Musculoskeletal: Negative.   Skin: Negative.   Allergic/Immunologic: Negative.   Neurological: Negative.   Hematological: Negative.   Psychiatric/Behavioral: Negative.     Per HPI unless specifically indicated above     Objective:    BP 111/72   Pulse 66   Temp 98.5 F (36.9 C)   Ht 5\' 2"  (1.575 m)   Wt 149 lb (67.6 kg)   LMP  (LMP Unknown)   SpO2 99%   BMI 27.25 kg/m   Wt Readings from Last 3 Encounters:  07/26/17 149 lb (67.6 kg)  06/23/17 147 lb 1.6 oz (66.7 kg)  02/28/17 156 lb (70.8 kg)    Physical Exam  Constitutional: She is oriented to person, place, and time. She appears well-developed and well-nourished.  HENT:  Head: Normocephalic and atraumatic.  Eyes: Pupils are equal, round, and reactive to light. Right eye exhibits no discharge. Left eye exhibits no discharge. No scleral icterus.  Neck: Normal range of  motion. Neck supple. Carotid bruit is not present. No thyromegaly present.  Cardiovascular: Normal rate, regular rhythm and normal heart sounds.  Exam reveals no gallop and no friction rub.   No murmur heard. Pulmonary/Chest: Effort normal and breath sounds normal. No respiratory distress. She has no wheezes. She has no rales.  Abdominal: Soft. Bowel sounds are normal. There is no tenderness. There is no rebound.  Genitourinary: No breast swelling, tenderness or discharge.  Musculoskeletal: Normal range of motion.  Lymphadenopathy:    She has no cervical adenopathy.  Neurological: She is alert and oriented to person, place, and time.  Skin: Skin is warm, dry and intact. No rash noted.  Psychiatric: She has a normal mood and affect. Her speech is normal and behavior is normal. Judgment and thought content normal. Cognition and memory are normal.   Assessment & Plan:   Problem List Items Addressed This Visit      Unprioritized   Hypercholesteremia   Relevant  Orders   Lipid Panel w/o Chol/HDL Ratio (Completed)   Comprehensive metabolic panel (Completed)    Other Visit Diagnoses    Need for influenza vaccination    -  Primary   Relevant Orders   Flu vaccine HIGH DOSE PF (Completed)   Need for pneumococcal vaccination       Relevant Orders   Pneumococcal polysaccharide vaccine 23-valent greater than or equal to 2yo subcutaneous/IM (Completed)   Annual physical exam       Relevant Orders   Comprehensive metabolic panel (Completed)   Pain of left hip joint       Stretches given       Follow up plan: No Follow-up on file.

## 2017-07-27 LAB — COMPREHENSIVE METABOLIC PANEL
A/G RATIO: 2.3 — AB (ref 1.2–2.2)
ALT: 22 IU/L (ref 0–32)
AST: 20 IU/L (ref 0–40)
Albumin: 4.3 g/dL (ref 3.6–4.8)
Alkaline Phosphatase: 49 IU/L (ref 39–117)
BILIRUBIN TOTAL: 0.5 mg/dL (ref 0.0–1.2)
BUN/Creatinine Ratio: 24 (ref 12–28)
BUN: 20 mg/dL (ref 8–27)
CALCIUM: 9.4 mg/dL (ref 8.7–10.3)
CHLORIDE: 104 mmol/L (ref 96–106)
CO2: 23 mmol/L (ref 20–29)
Creatinine, Ser: 0.82 mg/dL (ref 0.57–1.00)
GFR calc Af Amer: 86 mL/min/{1.73_m2} (ref >59)
GFR, EST NON AFRICAN AMERICAN: 75 mL/min/{1.73_m2} (ref >59)
GLUCOSE: 78 mg/dL (ref 65–99)
Globulin, Total: 1.9 g/dL (ref 1.5–4.5)
POTASSIUM: 4.5 mmol/L (ref 3.5–5.2)
Sodium: 141 mmol/L (ref 134–144)
Total Protein: 6.2 g/dL (ref 6.0–8.5)

## 2017-07-27 LAB — LIPID PANEL W/O CHOL/HDL RATIO
Cholesterol, Total: 293 mg/dL — ABNORMAL HIGH (ref 100–199)
HDL: 94 mg/dL (ref >39)
LDL Calculated: 186 mg/dL — ABNORMAL HIGH (ref 0–99)
Triglycerides: 67 mg/dL (ref 0–149)
VLDL CHOLESTEROL CAL: 13 mg/dL (ref 5–40)

## 2017-07-27 NOTE — Progress Notes (Signed)
Notified pt by mychart

## 2017-10-19 ENCOUNTER — Ambulatory Visit: Payer: Self-pay | Admitting: *Deleted

## 2017-10-19 ENCOUNTER — Encounter: Payer: Self-pay | Admitting: Family Medicine

## 2017-10-19 ENCOUNTER — Ambulatory Visit (INDEPENDENT_AMBULATORY_CARE_PROVIDER_SITE_OTHER): Payer: Medicare Other | Admitting: Family Medicine

## 2017-10-19 VITALS — BP 113/64 | HR 74 | Temp 97.8°F | Wt 154.3 lb

## 2017-10-19 DIAGNOSIS — S0083XA Contusion of other part of head, initial encounter: Secondary | ICD-10-CM | POA: Diagnosis not present

## 2017-10-19 NOTE — Progress Notes (Signed)
BP 113/64 (BP Location: Left Arm, Patient Position: Sitting, Cuff Size: Normal)   Pulse 74   Temp 97.8 F (36.6 C) (Oral)   Wt 154 lb 4.8 oz (70 kg)   LMP  (LMP Unknown)   SpO2 100%   BMI 28.22 kg/m    Subjective:    Patient ID: Breanna Davidson, female    DOB: 07/17/1951, 66 y.o.   MRN: [id number]  HPI: Breanna Davidson is a 66 y.o. female  Chief Complaint  Patient presents with  . Fall    Patient fell 3 days ago. Hit head, bruise and cut on right side of forehead near temple.  Marland Kitchen Headache    Started today.  . Dizziness    Patient states she feels like a "zombie".   Fall with no LOC x 3 days ago with impact to right temple. No other injuries other than the forehead contusion per patient. She did not seek care immediately. Now with fatigue, nausea, and headache at site of contusion. Difficulty concentrating, but no confusion or word finding issues, vomiting, gait problems, visual changes. Does not take any blood thinners. Has been using ice over the contusion and tylenol prn but no other tx's tried.   Relevant past medical, surgical, family and social history reviewed and updated as indicated. Interim medical history since our last visit reviewed. Allergies and medications reviewed and updated.  Review of Systems  Constitutional: Positive for fatigue.  HENT: Negative.   Respiratory: Negative.   Cardiovascular: Negative.   Gastrointestinal: Negative.   Musculoskeletal: Negative.   Skin:       Forehead contusion  Neurological: Positive for headaches.  Psychiatric/Behavioral: Negative.     Per HPI unless specifically indicated above     Objective:    BP 113/64 (BP Location: Left Arm, Patient Position: Sitting, Cuff Size: Normal)   Pulse 74   Temp 97.8 F (36.6 C) (Oral)   Wt 154 lb 4.8 oz (70 kg)   LMP  (LMP Unknown)   SpO2 100%   BMI 28.22 kg/m   Wt Readings from Last 3 Encounters:  10/19/17 154 lb 4.8 oz (70 kg)  07/26/17 149 lb (67.6 kg)  06/23/17  147 lb 1.6 oz (66.7 kg)    Physical Exam  Constitutional: She is oriented to person, place, and time. She appears well-developed and well-nourished. No distress.  HENT:  Head: Atraumatic.  Eyes: Conjunctivae are normal. Pupils are equal, round, and reactive to light. No scleral icterus.  Neck: Normal range of motion. Neck supple.  Cardiovascular: Normal rate and normal heart sounds.  Pulmonary/Chest: Effort normal and breath sounds normal.  Musculoskeletal: Normal range of motion. She exhibits no edema, tenderness (No spinal ttp, no extremity ttp) or deformity.  Lymphadenopathy:    She has no cervical adenopathy.  Neurological: She is alert and oriented to person, place, and time. She displays normal reflexes. No cranial nerve deficit. She exhibits normal muscle tone. Coordination normal.  Skin: Skin is warm and dry.  Ecchymosis right forehead from fall impact with 3 mm scab in middle from a small lac sustained in fall   Psychiatric: She has a normal mood and affect. Her behavior is normal.  Nursing note and vitals reviewed.     Assessment & Plan:   Problem List Items Addressed This Visit    None    Visit Diagnoses    Contusion of other part of head, initial encounter    -  Primary   Given worsening sxs, recommended  going to ER for immediate CT scan to r/o any bleeds. Suspect concussion but cannot r/o more severe pathology    Despite recommendation, pt wanting to go home and continue rest. Long discussion about warning signs and return precautions. Pt to f/u with any concerns. Tylenol, rest, cool compresses to head contusion.    Follow up plan: Return if symptoms worsen or fail to improve.

## 2017-10-19 NOTE — Telephone Encounter (Signed)
appt made  Today  With  Breanna Davidson    Pt  Was  Advised   To  Call  911 if  Worse    Pt  Verbalizes  A  Knowledge of  [place]    She is  Awake  And  Alert  And  Oriented   At this  Time      Reason for Disposition . [1] MODERATE headache (e.g., interferes with normal activities) AND [2] present > 24 hours AND [3] unexplained  (Exceptions: analgesics not tried, typical migraine, or headache part of viral illness)    Pt  Fell  3  Days  Ago  Has  Headache  And  eccymosis   To  Forehead  Answer Assessment - Initial Assessment Questions 1. LOCATION: "Where does it hurt?"       Pain    forehead   2. ONSET: "When did the headache start?" (Minutes, hours or days)        Pt  reprts  3  Days    3. PATTERN: "Does the pain come and go, or has it been constant since it started?"     COMES   AND  GOES   4. SEVERITY: "How bad is the pain?" and "What does it keep you from doing?"  (e.g., Scale 1-10; mild, moderate, or severe)   - MILD (1-3): doesn't interfere with normal activities    - MODERATE (4-7): interferes with normal activities or awakens from sleep    - SEVERE (8-10): excruciating pain, unable to do any normal activities          MODERATE   5. RECURRENT SYMPTOM: "Have you ever had headaches before?" If so, ask: "When was the last time?" and "What happened that time?"      NO 6. CAUSE: "What do you think is causing the headache?"     FELL  3  DAYS  AGO   7. MIGRAINE: "Have you been diagnosed with migraine headaches?" If so, ask: "Is this headache similar?"      NO 8. HEAD INJURY: "Has there been any recent injury to the head?"      Yes    [address]  Today   Has   Bruise      r  Temple   And   Small  Cut  That  Is  Healing   Bruises  On  Scrapes   On  Both  Legs   Pt  Able   To  Walk   On it  Well   9. OTHER SYMPTOMS: "Do you have any other symptoms?" (fever, stiff neck, eye pain, sore throat, cold symptoms)     Feels  A  Little  Tired   Walked  Today  Today  For  1/2  Hour   10. PREGNANCY: "Is  there any chance you are pregnant?" "When was your last menstrual period?"       N/A  Protocols used: HEADACHE-A-AH

## 2017-10-21 NOTE — Patient Instructions (Signed)
Follow up as needed

## 2017-11-29 DIAGNOSIS — H2513 Age-related nuclear cataract, bilateral: Secondary | ICD-10-CM | POA: Diagnosis not present

## 2017-12-05 ENCOUNTER — Ambulatory Visit (INDEPENDENT_AMBULATORY_CARE_PROVIDER_SITE_OTHER): Payer: Medicare Other | Admitting: Unknown Physician Specialty

## 2017-12-05 ENCOUNTER — Ambulatory Visit
Admission: RE | Admit: 2017-12-05 | Discharge: 2017-12-05 | Disposition: A | Discharge status: Home / Self Care | Payer: Medicare Other | Source: Ambulatory Visit | Attending: Unknown Physician Specialty | Admitting: Unknown Physician Specialty

## 2017-12-05 ENCOUNTER — Encounter: Payer: Self-pay | Admitting: Unknown Physician Specialty

## 2017-12-05 DIAGNOSIS — M25362 Other instability, left knee: Secondary | ICD-10-CM

## 2017-12-05 DIAGNOSIS — M7062 Trochanteric bursitis, left hip: Secondary | ICD-10-CM | POA: Insufficient documentation

## 2017-12-05 DIAGNOSIS — M25562 Pain in left knee: Principal | ICD-10-CM

## 2017-12-05 NOTE — Assessment & Plan Note (Addendum)
New problem.  Discussed quadraceps strengthening exercises.  NSAIDs as tolerated.  Discussed CBD oil and OK as long as brands are researched.

## 2017-12-05 NOTE — Progress Notes (Signed)
BP 113/76   Pulse 64   Temp 98.3 F (36.8 C) (Oral)   Wt 152 lb 9.6 oz (69.2 kg)   LMP  (LMP Unknown)   SpO2 100%   BMI 27.91 kg/m    Subjective:    Patient ID: Breanna Davidson, female    DOB: [birth year], 67 y.o.   MRN: [id number]  HPI: Breanna Davidson is a 67 y.o. female  Chief Complaint  Patient presents with  . Leg Pain    pt states she has had left leg and knee pain off and on for 6 months. States she walks every morning and does yoga as well.    Hip and knee pain Pt points to left hip and greater trochanter and says it hurts when she presses there.  In general it is better.    Knee Pain   Incident onset: no specific injury started 6 months ago. There was no injury mechanism. The pain is present in the left leg. The quality of the pain is described as aching. The pain is moderate. The pain has been fluctuating since onset. Pertinent negatives include no inability to bear weight, loss of motion, loss of sensation, muscle weakness, numbness or tingling. She reports no foreign bodies present. Nothing aggravates the symptoms. She has tried nothing for the symptoms. The treatment provided no relief.       Relevant past medical, surgical, family and social history reviewed and updated as indicated. Interim medical history since our last visit reviewed. Allergies and medications reviewed and updated.  Review of Systems  Neurological: Negative for tingling and numbness.    Per HPI unless specifically indicated above     Objective:    BP 113/76   Pulse 64   Temp 98.3 F (36.8 C) (Oral)   Wt 152 lb 9.6 oz (69.2 kg)   LMP  (LMP Unknown)   SpO2 100%   BMI 27.91 kg/m   Wt Readings from Last 3 Encounters:  12/05/17 152 lb 9.6 oz (69.2 kg)  10/19/17 154 lb 4.8 oz (70 kg)  07/26/17 149 lb (67.6 kg)    Physical Exam  Constitutional: She is oriented to person, place, and time. She appears well-developed and well-nourished. No distress.  HENT:  Head:  Normocephalic and atraumatic.  Eyes: Conjunctivae and lids are normal. Right eye exhibits no discharge. Left eye exhibits no discharge. No scleral icterus.  Neck: Normal range of motion. Neck supple. No JVD present. Carotid bruit is not present.  Cardiovascular: Normal rate, regular rhythm and normal heart sounds.  Pulmonary/Chest: Effort normal and breath sounds normal.  Abdominal: Normal appearance. There is no splenomegaly or hepatomegaly.  Musculoskeletal: Normal range of motion.       Left knee: She exhibits effusion. She exhibits normal range of motion, no ecchymosis, no bony tenderness, normal meniscus and no MCL laxity. No tenderness found. No medial joint line, no lateral joint line, no MCL and no LCL tenderness noted.  Neurological: She is alert and oriented to person, place, and time.  Skin: Skin is warm, dry and intact. No rash noted. No pallor.  Psychiatric: She has a normal mood and affect. Her behavior is normal. Judgment and thought content normal.    Results for orders placed or performed in visit on 07/26/17  Lipid Panel w/o Chol/HDL Ratio  Result Value Ref Range   Cholesterol, Total 293 (H) 100 - 199 mg/dL   Triglycerides 67 0 - 149 mg/dL   HDL 94 >39 mg/dL  VLDL Cholesterol Cal 13 5 - 40 mg/dL   LDL Calculated 186 (H) 0 - 99 mg/dL  Comprehensive metabolic panel  Result Value Ref Range   Glucose 78 65 - 99 mg/dL   BUN 20 8 - 27 mg/dL   Creatinine, Ser 0.82 0.57 - 1.00 mg/dL   GFR calc non Af Amer 75 >59 mL/min/1.73   GFR calc Af Amer 86 >59 mL/min/1.73   BUN/Creatinine Ratio 24 12 - 28   Sodium 141 134 - 144 mmol/L   Potassium 4.5 3.5 - 5.2 mmol/L   Chloride 104 96 - 106 mmol/L   CO2 23 20 - 29 mmol/L   Calcium 9.4 8.7 - 10.3 mg/dL   Total Protein 6.2 6.0 - 8.5 g/dL   Albumin 4.3 3.6 - 4.8 g/dL   Globulin, Total 1.9 1.5 - 4.5 g/dL   Albumin/Globulin Ratio 2.3 (H) 1.2 - 2.2   Bilirubin Total 0.5 0.0 - 1.2 mg/dL   Alkaline Phosphatase 49 39 - 117 IU/L   AST  20 0 - 40 IU/L   ALT 22 0 - 32 IU/L      Assessment & Plan:   Problem List Items Addressed This Visit      Unprioritized   Greater trochanteric bursitis of left hip    Discussed patho-physiology  OK for massage to area      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  Discussed quadraceps strengthening exercises.  NSAIDs as tolerated.  Discussed CBD oil and OK as long as brands are researched.      Relevant Orders   DG Knee Complete 4 Views Left       Follow up plan: Return if symptoms worsen or fail to improve.

## 2017-12-05 NOTE — Patient Instructions (Addendum)
Hip Bursitis Hip bursitis is inflammation of a fluid-filled sac (bursa) in the hip joint. The bursa protects the bones in the hip joint from rubbing against each other. Hip bursitis can cause mild to moderate pain, and symptoms often come and go over time. What are the causes? This condition may be caused by:  Injury to the hip.  Overuse of the muscles that surround the hip joint.  Arthritis or gout.  Diabetes.  Thyroid disease.  Cold weather.  Infection.  In some cases, the cause may not be known. What are the signs or symptoms? Symptoms of this condition may include:  Mild or moderate pain in the hip area. Pain may get worse with movement.  Tenderness and swelling of the hip, especially on the outer side of the hip.  Symptoms may come and go. If the bursa becomes infected, you may have the following symptoms:  Fever.  Red skin and a feeling of warmth in the hip area.  How is this diagnosed? This condition may be diagnosed based on:  A physical exam.  Your medical history.  X-rays.  Removal of fluid from your inflamed bursa for testing (biopsy).  You may be sent to a health care provider who specializes in bone diseases (orthopedist) or a provider who specializes in joint inflammation (rheumatologist). How is this treated? This condition is treated by resting, raising (elevating), and applying pressure(compression) to the injured area. In some cases, this may be enough to make your symptoms go away. Treatment may also include:  Crutches.  Antibiotic medicine.  Draining fluid out of the bursa to help relieve swelling.  Injecting medicine that helps to reduce inflammation (cortisone).  Follow these instructions at home: Medicines  Take over-the-counter and prescription medicines only as told by your health care provider.  Do not drive or operate heavy machinery while taking prescription pain medicine, or as told by your health care provider.  If you were  prescribed an antibiotic, take it as told by your health care provider. Do not stop taking the antibiotic even if you start to feel better. Activity  Return to your normal activities as told by your health care provider. Ask your health care provider what activities are safe for you.  Rest and protect your hip as much as possible until your pain and swelling get better. General instructions  Wear compression wraps only as told by your health care provider.  Elevate your hip above the level of your heart as much as you can without pain. To do this, try putting a pillow under your hips while you lie down.  Do not use your hip to support your body weight until your health care provider says that you can. Use crutches as told by your health care provider.  Gently massage and stretch your injured area as often as is comfortable.  Keep all follow-up visits as told by your health care provider. This is important. How is this prevented?  Exercise regularly, as told by your health care provider.  Warm up and stretch before being active.  Cool down and stretch after being active.  If an activity irritates your hip or causes pain, avoid the activity as much as possible.  Avoid sitting down for long periods at a time. Contact a health care provider if:  You have a fever.  You develop new symptoms.  You have difficulty walking or doing everyday activities.  You have pain that gets worse or does not get better with medicine.  You  develop red skin or a feeling of warmth in your hip area. Get help right away if:  You cannot move your hip.  You have severe pain. This information is not intended to replace advice given to you by your health care provider. Make sure you discuss any questions you have with your health care provider. Document Released: 04/02/2002 Document Revised: 03/18/2016 Document Reviewed: 05/13/2015 Elsevier Interactive Patient Education  2018 [employer].  Knee  Exercises Ask your health care provider which exercises are safe for you. Do exercises exactly as told by your health care provider and adjust them as directed. It is normal to feel mild stretching, pulling, tightness, or discomfort as you do these exercises, but you should stop right away if you feel sudden pain or your pain gets worse.Do not begin these exercises until told by your health care provider. STRETCHING AND RANGE OF MOTION EXERCISES These exercises warm up your muscles and joints and improve the movement and flexibility of your knee. These exercises also help to relieve pain, numbness, and tingling. Exercise A: Knee Extension, Prone 1. Lie on your abdomen on a bed. 2. Place your left / right knee just beyond the edge of the surface so your knee is not on the bed. You can put a towel under your left / right thigh just above your knee for comfort. 3. Relax your leg muscles and allow gravity to straighten your knee. You should feel a stretch behind your left / right knee. 4. Hold this position for __________ seconds. 5. Scoot up so your knee is supported between repetitions. Repeat __________ times. Complete this stretch __________ times a day. Exercise B: Knee Flexion, Active  1. Lie on your back with both knees straight. If this causes back discomfort, bend your left / right knee so your foot is flat on the floor. 2. Slowly slide your left / right heel back toward your buttocks until you feel a gentle stretch in the front of your knee or thigh. 3. Hold this position for __________ seconds. 4. Slowly slide your left / right heel back to the starting position. Repeat __________ times. Complete this exercise __________ times a day. Exercise C: Quadriceps, Prone  1. Lie on your abdomen on a firm surface, such as a bed or padded floor. 2. Bend your left / right knee and hold your ankle. If you cannot reach your ankle or pant leg, loop a belt around your foot and grab the belt  instead. 3. Gently pull your heel toward your buttocks. Your knee should not slide out to the side. You should feel a stretch in the front of your thigh and knee. 4. Hold this position for __________ seconds. Repeat __________ times. Complete this stretch __________ times a day. Exercise D: Hamstring, Supine 1. Lie on your back. 2. Loop a belt or towel over the ball of your left / right foot. The ball of your foot is on the walking surface, right under your toes. 3. Straighten your left / right knee and slowly pull on the belt to raise your leg until you feel a gentle stretch behind your knee. ? Do not let your left / right knee bend while you do this. ? Keep your other leg flat on the floor. 4. Hold this position for __________ seconds. Repeat __________ times. Complete this stretch __________ times a day. STRENGTHENING EXERCISES These exercises build strength and endurance in your knee. Endurance is the ability to use your muscles for a long time, even  after they get tired. Exercise E: Quadriceps, Isometric  1. Lie on your back with your left / right leg extended and your other knee bent. Put a rolled towel or small pillow under your knee if told by your health care provider. 2. Slowly tense the muscles in the front of your left / right thigh. You should see your kneecap slide up toward your hip or see increased dimpling just above the knee. This motion will push the back of the knee toward the floor. 3. For __________ seconds, keep the muscle as tight as you can without increasing your pain. 4. Relax the muscles slowly and completely. Repeat __________ times. Complete this exercise __________ times a day. Exercise F: Straight Leg Raises - Quadriceps 1. Lie on your back with your left / right leg extended and your other knee bent. 2. Tense the muscles in the front of your left / right thigh. You should see your kneecap slide up or see increased dimpling just above the knee. Your thigh may  even shake a bit. 3. Keep these muscles tight as you raise your leg 4-6 inches (10-15 cm) off the floor. Do not let your knee bend. 4. Hold this position for __________ seconds. 5. Keep these muscles tense as you lower your leg. 6. Relax your muscles slowly and completely after each repetition. Repeat __________ times. Complete this exercise __________ times a day. Exercise G: Hamstring, Isometric 1. Lie on your back on a firm surface. 2. Bend your left / right knee approximately __________ degrees. 3. Dig your left / right heel into the surface as if you are trying to pull it toward your buttocks. Tighten the muscles in the back of your thighs to dig as hard as you can without increasing any pain. 4. Hold this position for __________ seconds. 5. Release the tension gradually and allow your muscles to relax completely for __________ seconds after each repetition. Repeat __________ times. Complete this exercise __________ times a day. Exercise H: Hamstring Curls  If told by your health care provider, do this exercise while wearing ankle weights. Begin with __________ weights. Then increase the weight by 1 lb (0.5 kg) increments. Do not wear ankle weights that are more than __________. 1. Lie on your abdomen with your legs straight. 2. Bend your left / right knee as far as you can without feeling pain. Keep your hips flat against the floor. 3. Hold this position for __________ seconds. 4. Slowly lower your leg to the starting position.  Repeat __________ times. Complete this exercise __________ times a day. Exercise I: Squats (Quadriceps) 1. Stand in front of a table, with your feet and knees pointing straight ahead. You may rest your hands on the table for balance but not for support. 2. Slowly bend your knees and lower your hips like you are going to sit in a chair. ? Keep your weight over your heels, not over your toes. ? Keep your lower legs upright so they are parallel with the table  legs. ? Do not let your hips go lower than your knees. ? Do not bend lower than told by your health care provider. ? If your knee pain increases, do not bend as low. 3. Hold the squat position for __________ seconds. 4. Slowly push with your legs to return to standing. Do not use your hands to pull yourself to standing. Repeat __________ times. Complete this exercise __________ times a day. Exercise J: Wall Slides (Quadriceps)  1. Lean your back against  a smooth wall or door while you walk your feet out 18-24 inches (46-61 cm) from it. 2. Place your feet hip-width apart. 3. Slowly slide down the wall or door until your knees bend __________ degrees. Keep your knees over your heels, not over your toes. Keep your knees in line with your hips. 4. Hold for __________ seconds. Repeat __________ times. Complete this exercise __________ times a day. Exercise K: Straight Leg Raises - Hip Abductors 1. Lie on your side with your left / right leg in the top position. Lie so your head, shoulder, knee, and hip line up. You may bend your bottom knee to help you keep your balance. 2. Roll your hips slightly forward so your hips are stacked directly over each other and your left / right knee is facing forward. 3. Leading with your heel, lift your top leg 4-6 inches (10-15 cm). You should feel the muscles in your outer hip lifting. ? Do not let your foot drift forward. ? Do not let your knee roll toward the ceiling. 4. Hold this position for __________ seconds. 5. Slowly return your leg to the starting position. 6. Let your muscles relax completely after each repetition. Repeat __________ times. Complete this exercise __________ times a day. Exercise L: Straight Leg Raises - Hip Extensors 1. Lie on your abdomen on a firm surface. You can put a pillow under your hips if that is more comfortable. 2. Tense the muscles in your buttocks and lift your left / right leg about 4-6 inches (10-15 cm). Keep your knee  straight as you lift your leg. 3. Hold this position for __________ seconds. 4. Slowly lower your leg to the starting position. 5. Let your leg relax completely after each repetition. Repeat __________ times. Complete this exercise __________ times a day. This information is not intended to replace advice given to you by your health care provider. Make sure you discuss any questions you have with your health care provider. Document Released: 08/25/2005 Document Revised: 07/05/2016 Document Reviewed: 08/17/2015 Elsevier Interactive Patient Education  2018 [employer].

## 2017-12-05 NOTE — Assessment & Plan Note (Signed)
Discussed patho-physiology  OK for massage to area

## 2017-12-05 NOTE — Progress Notes (Signed)
Notified pt by mychart

## 2018-01-06 DIAGNOSIS — H40053 Ocular hypertension, bilateral: Secondary | ICD-10-CM | POA: Diagnosis not present

## 2018-01-10 DIAGNOSIS — D225 Melanocytic nevi of trunk: Secondary | ICD-10-CM | POA: Diagnosis not present

## 2018-01-12 DIAGNOSIS — H2511 Age-related nuclear cataract, right eye: Secondary | ICD-10-CM | POA: Diagnosis not present

## 2018-02-15 DIAGNOSIS — M5442 Lumbago with sciatica, left side: Secondary | ICD-10-CM | POA: Diagnosis not present

## 2018-02-15 DIAGNOSIS — M9903 Segmental and somatic dysfunction of lumbar region: Secondary | ICD-10-CM | POA: Diagnosis not present

## 2018-02-15 DIAGNOSIS — M25552 Pain in left hip: Secondary | ICD-10-CM | POA: Diagnosis not present

## 2018-02-17 DIAGNOSIS — M9903 Segmental and somatic dysfunction of lumbar region: Secondary | ICD-10-CM | POA: Diagnosis not present

## 2018-02-17 DIAGNOSIS — M5442 Lumbago with sciatica, left side: Secondary | ICD-10-CM | POA: Diagnosis not present

## 2018-02-17 DIAGNOSIS — M25552 Pain in left hip: Secondary | ICD-10-CM | POA: Diagnosis not present

## 2018-02-20 DIAGNOSIS — M5442 Lumbago with sciatica, left side: Secondary | ICD-10-CM | POA: Diagnosis not present

## 2018-02-20 DIAGNOSIS — M9903 Segmental and somatic dysfunction of lumbar region: Secondary | ICD-10-CM | POA: Diagnosis not present

## 2018-02-20 DIAGNOSIS — M25552 Pain in left hip: Secondary | ICD-10-CM | POA: Diagnosis not present

## 2018-02-24 DIAGNOSIS — M25552 Pain in left hip: Secondary | ICD-10-CM | POA: Diagnosis not present

## 2018-02-24 DIAGNOSIS — M9903 Segmental and somatic dysfunction of lumbar region: Secondary | ICD-10-CM | POA: Diagnosis not present

## 2018-02-24 DIAGNOSIS — M5442 Lumbago with sciatica, left side: Secondary | ICD-10-CM | POA: Diagnosis not present

## 2018-02-27 DIAGNOSIS — M25552 Pain in left hip: Secondary | ICD-10-CM | POA: Diagnosis not present

## 2018-02-27 DIAGNOSIS — M9903 Segmental and somatic dysfunction of lumbar region: Secondary | ICD-10-CM | POA: Diagnosis not present

## 2018-02-27 DIAGNOSIS — M5442 Lumbago with sciatica, left side: Secondary | ICD-10-CM | POA: Diagnosis not present

## 2018-03-06 DIAGNOSIS — M9903 Segmental and somatic dysfunction of lumbar region: Secondary | ICD-10-CM | POA: Diagnosis not present

## 2018-03-06 DIAGNOSIS — M25552 Pain in left hip: Secondary | ICD-10-CM | POA: Diagnosis not present

## 2018-03-06 DIAGNOSIS — M5442 Lumbago with sciatica, left side: Secondary | ICD-10-CM | POA: Diagnosis not present

## 2018-03-22 DIAGNOSIS — M25552 Pain in left hip: Secondary | ICD-10-CM | POA: Diagnosis not present

## 2018-03-22 DIAGNOSIS — M5442 Lumbago with sciatica, left side: Secondary | ICD-10-CM | POA: Diagnosis not present

## 2018-03-22 DIAGNOSIS — M9903 Segmental and somatic dysfunction of lumbar region: Secondary | ICD-10-CM | POA: Diagnosis not present

## 2018-03-27 DIAGNOSIS — M5442 Lumbago with sciatica, left side: Secondary | ICD-10-CM | POA: Diagnosis not present

## 2018-03-27 DIAGNOSIS — M9903 Segmental and somatic dysfunction of lumbar region: Secondary | ICD-10-CM | POA: Diagnosis not present

## 2018-03-27 DIAGNOSIS — M25552 Pain in left hip: Secondary | ICD-10-CM | POA: Diagnosis not present

## 2018-03-28 ENCOUNTER — Encounter: Payer: Self-pay | Admitting: Unknown Physician Specialty

## 2018-03-28 ENCOUNTER — Ambulatory Visit: Payer: Medicare Other | Admitting: Unknown Physician Specialty

## 2018-03-28 ENCOUNTER — Ambulatory Visit
Admission: RE | Admit: 2018-03-28 | Discharge: 2018-03-28 | Disposition: A | Discharge status: Home / Self Care | Payer: Medicare Other | Source: Ambulatory Visit | Attending: Unknown Physician Specialty | Admitting: Unknown Physician Specialty

## 2018-03-28 VITALS — BP 119/76 | HR 69 | Temp 98.5°F | Ht 62.0 in | Wt 160.4 lb

## 2018-03-28 DIAGNOSIS — M7632 Iliotibial band syndrome, left leg: Secondary | ICD-10-CM | POA: Diagnosis not present

## 2018-03-28 DIAGNOSIS — M25552 Pain in left hip: Secondary | ICD-10-CM

## 2018-03-28 DIAGNOSIS — G5702 Lesion of sciatic nerve, left lower limb: Secondary | ICD-10-CM | POA: Diagnosis not present

## 2018-03-28 DIAGNOSIS — R05 Cough: Secondary | ICD-10-CM

## 2018-03-28 DIAGNOSIS — R059 Cough, unspecified: Secondary | ICD-10-CM

## 2018-03-28 NOTE — Patient Instructions (Signed)
IT band tendonitis Piriformis Hip bursitis.

## 2018-03-28 NOTE — Progress Notes (Signed)
BP 119/76   Pulse 69   Temp 98.5 F (36.9 C) (Oral)   Ht 5\' 2"  (1.575 m)   Wt 160 lb 6.4 oz (72.8 kg)   LMP  (LMP Unknown)   SpO2 98%   BMI 29.34 kg/m    Subjective:    Patient ID: Breanna Davidson, female    DOB: 09-24-1951, 67 y.o.   MRN: [id number]  HPI: Breanna Davidson is a 67 y.o. female  Chief Complaint  Patient presents with  . URI    pt states she has ahd a cough and chest congestion for about 2 weeks   . Hip Pain    left hip, pt requests x-ray   Cough  This is a new problem. Episode onset: 2 week. The problem has been gradually improving. The cough is productive of sputum. Pertinent negatives include no chest pain, chills, headaches, nasal congestion, rash, rhinorrhea, sweats or wheezing. Nothing aggravates the symptoms. She has tried nothing for the symptoms. The treatment provided no relief.   Hip pain Pt states she is having pain in the back of her left hip.  It radiates laterally to her knee.  Feels like pins and needles in the lateral knee.    Relevant past medical, surgical, family and social history reviewed and updated as indicated. Interim medical history since our last visit reviewed. Allergies and medications reviewed and updated.  Review of Systems  Constitutional: Negative for chills.  HENT: Negative for rhinorrhea.   Respiratory: Positive for cough. Negative for wheezing.   Cardiovascular: Negative for chest pain.  Skin: Negative for rash.  Neurological: Negative for headaches.    Per HPI unless specifically indicated above     Objective:    BP 119/76   Pulse 69   Temp 98.5 F (36.9 C) (Oral)   Ht 5\' 2"  (1.575 m)   Wt 160 lb 6.4 oz (72.8 kg)   LMP  (LMP Unknown)   SpO2 98%   BMI 29.34 kg/m   Wt Readings from Last 3 Encounters:  03/28/18 160 lb 6.4 oz (72.8 kg)  12/05/17 152 lb 9.6 oz (69.2 kg)  10/19/17 154 lb 4.8 oz (70 kg)    Physical Exam  Constitutional: She is oriented to person, place, and time. She appears  well-developed and well-nourished. No distress.  HENT:  Head: Normocephalic and atraumatic.  Eyes: Conjunctivae and lids are normal. Right eye exhibits no discharge. Left eye exhibits no discharge. No scleral icterus.  Neck: Normal range of motion. Neck supple. No JVD present. Carotid bruit is not present.  Cardiovascular: Normal rate, regular rhythm and normal heart sounds.  Pulmonary/Chest: Effort normal and breath sounds normal.  Abdominal: Normal appearance. There is no splenomegaly or hepatomegaly.  Musculoskeletal: Normal range of motion.  Tender along IT band  Neurological: She is alert and oriented to person, place, and time.  Skin: Skin is warm, dry and intact. No rash noted. No pallor.  Psychiatric: She has a normal mood and affect. Her behavior is normal. Judgment and thought content normal.    Results for orders placed or performed in visit on 07/26/17  Lipid Panel w/o Chol/HDL Ratio  Result Value Ref Range   Cholesterol, Total 293 (H) 100 - 199 mg/dL   Triglycerides 67 0 - 149 mg/dL   HDL 94 >39 mg/dL   VLDL Cholesterol Cal 13 5 - 40 mg/dL   LDL Calculated 186 (H) 0 - 99 mg/dL  Comprehensive metabolic panel  Result Value Ref Range  Glucose 78 65 - 99 mg/dL   BUN 20 8 - 27 mg/dL   Creatinine, Ser 0.82 0.57 - 1.00 mg/dL   GFR calc non Af Amer 75 >59 mL/min/1.73   GFR calc Af Amer 86 >59 mL/min/1.73   BUN/Creatinine Ratio 24 12 - 28   Sodium 141 134 - 144 mmol/L   Potassium 4.5 3.5 - 5.2 mmol/L   Chloride 104 96 - 106 mmol/L   CO2 23 20 - 29 mmol/L   Calcium 9.4 8.7 - 10.3 mg/dL   Total Protein 6.2 6.0 - 8.5 g/dL   Albumin 4.3 3.6 - 4.8 g/dL   Globulin, Total 1.9 1.5 - 4.5 g/dL   Albumin/Globulin Ratio 2.3 (H) 1.2 - 2.2   Bilirubin Total 0.5 0.0 - 1.2 mg/dL   Alkaline Phosphatase 49 39 - 117 IU/L   AST 20 0 - 40 IU/L   ALT 22 0 - 32 IU/L      Assessment & Plan:   Problem List Items Addressed This Visit    None    Visit Diagnoses    Pain of left hip joint     -  Primary   Relevant Orders   DG HIP UNILAT WITH PELVIS 2-3 VIEWS LEFT   Iliotibial band syndrome of left side       Piriformis syndrome of left side       Cough       Post viral.  This is improving.  Discussed normal trajectory of viral symptoms      Explained that pt is most likely due to IT band tendonitis interacting with piriformis.  Will get x-ray due to pt is concerned that she might need hip surgery.  Refer to PT for further evaluation  Follow up plan: Return if symptoms worsen or fail to improve.

## 2018-03-29 ENCOUNTER — Other Ambulatory Visit: Payer: Self-pay | Admitting: Obstetrics and Gynecology

## 2018-04-03 DIAGNOSIS — M25562 Pain in left knee: Secondary | ICD-10-CM | POA: Diagnosis not present

## 2018-04-10 DIAGNOSIS — M25552 Pain in left hip: Secondary | ICD-10-CM | POA: Diagnosis not present

## 2018-04-10 DIAGNOSIS — M25562 Pain in left knee: Secondary | ICD-10-CM | POA: Diagnosis not present

## 2018-04-10 DIAGNOSIS — M9903 Segmental and somatic dysfunction of lumbar region: Secondary | ICD-10-CM | POA: Diagnosis not present

## 2018-04-10 DIAGNOSIS — M5442 Lumbago with sciatica, left side: Secondary | ICD-10-CM | POA: Diagnosis not present

## 2018-04-12 DIAGNOSIS — M25562 Pain in left knee: Secondary | ICD-10-CM | POA: Diagnosis not present

## 2018-04-18 DIAGNOSIS — M25562 Pain in left knee: Secondary | ICD-10-CM | POA: Diagnosis not present

## 2018-04-19 DIAGNOSIS — M25552 Pain in left hip: Secondary | ICD-10-CM | POA: Diagnosis not present

## 2018-04-19 DIAGNOSIS — M9903 Segmental and somatic dysfunction of lumbar region: Secondary | ICD-10-CM | POA: Diagnosis not present

## 2018-04-19 DIAGNOSIS — M5442 Lumbago with sciatica, left side: Secondary | ICD-10-CM | POA: Diagnosis not present

## 2018-04-24 DIAGNOSIS — M25562 Pain in left knee: Secondary | ICD-10-CM | POA: Diagnosis not present

## 2018-04-26 DIAGNOSIS — M9903 Segmental and somatic dysfunction of lumbar region: Secondary | ICD-10-CM | POA: Diagnosis not present

## 2018-04-26 DIAGNOSIS — M5442 Lumbago with sciatica, left side: Secondary | ICD-10-CM | POA: Diagnosis not present

## 2018-04-26 DIAGNOSIS — M25552 Pain in left hip: Secondary | ICD-10-CM | POA: Diagnosis not present

## 2018-05-10 DIAGNOSIS — M5442 Lumbago with sciatica, left side: Secondary | ICD-10-CM | POA: Diagnosis not present

## 2018-05-10 DIAGNOSIS — M25552 Pain in left hip: Secondary | ICD-10-CM | POA: Diagnosis not present

## 2018-05-10 DIAGNOSIS — M9903 Segmental and somatic dysfunction of lumbar region: Secondary | ICD-10-CM | POA: Diagnosis not present

## 2018-05-19 DIAGNOSIS — M25552 Pain in left hip: Secondary | ICD-10-CM | POA: Diagnosis not present

## 2018-05-19 DIAGNOSIS — M5442 Lumbago with sciatica, left side: Secondary | ICD-10-CM | POA: Diagnosis not present

## 2018-05-19 DIAGNOSIS — M9903 Segmental and somatic dysfunction of lumbar region: Secondary | ICD-10-CM | POA: Diagnosis not present

## 2018-05-29 ENCOUNTER — Other Ambulatory Visit: Payer: Self-pay | Admitting: Unknown Physician Specialty

## 2018-05-29 ENCOUNTER — Other Ambulatory Visit: Payer: Self-pay | Admitting: Family Medicine

## 2018-05-29 DIAGNOSIS — Z1231 Encounter for screening mammogram for malignant neoplasm of breast: Secondary | ICD-10-CM

## 2018-06-02 DIAGNOSIS — M5442 Lumbago with sciatica, left side: Secondary | ICD-10-CM | POA: Diagnosis not present

## 2018-06-02 DIAGNOSIS — M9903 Segmental and somatic dysfunction of lumbar region: Secondary | ICD-10-CM | POA: Diagnosis not present

## 2018-06-02 DIAGNOSIS — M25552 Pain in left hip: Secondary | ICD-10-CM | POA: Diagnosis not present

## 2018-06-05 ENCOUNTER — Encounter: Payer: Self-pay | Admitting: Physician Assistant

## 2018-06-05 ENCOUNTER — Ambulatory Visit: Payer: Medicare Other | Admitting: Physician Assistant

## 2018-06-05 VITALS — BP 113/72 | HR 90 | Temp 98.8°F | Ht 62.0 in | Wt 162.2 lb

## 2018-06-05 DIAGNOSIS — M7062 Trochanteric bursitis, left hip: Secondary | ICD-10-CM

## 2018-06-05 MED ORDER — TRIAMCINOLONE ACETONIDE 40 MG/ML IJ SUSP
40.0000 mg | Freq: Once | INTRAMUSCULAR | Status: AC
  Administered 2018-06-05: 40 mg via INTRAMUSCULAR

## 2018-06-05 MED ORDER — LIDOCAINE HCL (PF) 1 % IJ SOLN: 4.0000 mL | Freq: Once | INTRAMUSCULAR | Status: DC

## 2018-06-05 MED ORDER — LIDOCAINE HCL 1 % IJ SOLN
4.0000 mL | Freq: Once | INTRAMUSCULAR | Status: AC
Start: 2018-06-05 — End: 2018-06-05
  Administered 2018-06-05: 4 mL

## 2018-06-05 NOTE — Progress Notes (Signed)
Subjective:    Patient ID: Breanna Davidson, female    DOB: 1951-05-19, 67 y.o.   MRN: [id number]  Breanna Davidson is a 67 y.o. female presenting on 06/05/2018 for Pain (pt states she has been having pain from her left hip down into her left knee. States she also has a knot on her left knee. )   HPI   Patient has had left hip pain for months. She is point tender on the outside of her hip. She has trouble walking and pain radiating down to her left knee. She is point tender on the outside of her left hp. She has tried physical therapy, chiropracter, exercise, anti-inflammatories without success. She had xrays of her hips with no acute osseous abnormality.   Social History   Tobacco Use  . Smoking status: Never Smoker  . Smokeless tobacco: Never Used  Substance Use Topics  . Alcohol use: Yes    Alcohol/week: 1.0 - 2.0 standard drinks    Types: 1 - 2 Glasses of wine per week  . Drug use: No    Review of Systems Per HPI unless specifically indicated above     Objective:    BP 113/72   Pulse 90   Temp 98.8 F (37.1 C) (Oral)   Ht 5\' 2"  (1.575 m)   Wt 162 lb 3.2 oz (73.6 kg)   LMP  (LMP Unknown)   SpO2 97%   BMI 29.67 kg/m   Wt Readings from Last 3 Encounters:  06/05/18 162 lb 3.2 oz (73.6 kg)  03/28/18 160 lb 6.4 oz (72.8 kg)  12/05/17 152 lb 9.6 oz (69.2 kg)    Physical Exam  Constitutional: She is oriented to person, place, and time.  Musculoskeletal:  She is point tender over her left trochanteric bursa   Neurological: She is alert and oriented to person, place, and time.  Skin: Skin is warm and dry.  Psychiatric: She has a normal mood and affect. Her behavior is normal.   Trochanteric Bursitis Injection   Timeout was taken to identify the patient and the correct limb. We are injecting her left trochanteric bursa today. She has been informed of the risks of this procedure including but not limited to infection and short term worsening of pain. She gave  verbal consent with this procedure. Allergies were reviewed and it was determined that "altered mental status" in relation to oral prednisone is not an allergy or contraindication to this procedure.   The area of maximal tenderness was identified in the region of her left trochanteric bursa. This area was cleansed with three betadine swabs. Excess betadine was cleaned with alcohol swabs. Ethyl chloride spray was used to numb the area. A five CC syringe and 25 gauge needle filled with 4 cc xylocaine 1% and 1 cc kenalog 40 mg was used to infiltrate the area. Patient tolerate procedure well. Bandaid was used to cover the puncture wound.   Results for orders placed or performed in visit on 07/26/17  Lipid Panel w/o Chol/HDL Ratio  Result Value Ref Range   Cholesterol, Total 293 (H) 100 - 199 mg/dL   Triglycerides 67 0 - 149 mg/dL   HDL 94 >39 mg/dL   VLDL Cholesterol Cal 13 5 - 40 mg/dL   LDL Calculated 186 (H) 0 - 99 mg/dL  Comprehensive metabolic panel  Result Value Ref Range   Glucose 78 65 - 99 mg/dL   BUN 20 8 - 27 mg/dL   Creatinine, Ser 0.82 0.57 -  1.00 mg/dL   GFR calc non Af Amer 75 >59 mL/min/1.73   GFR calc Af Amer 86 >59 mL/min/1.73   BUN/Creatinine Ratio 24 12 - 28   Sodium 141 134 - 144 mmol/L   Potassium 4.5 3.5 - 5.2 mmol/L   Chloride 104 96 - 106 mmol/L   CO2 23 20 - 29 mmol/L   Calcium 9.4 8.7 - 10.3 mg/dL   Total Protein 6.2 6.0 - 8.5 g/dL   Albumin 4.3 3.6 - 4.8 g/dL   Globulin, Total 1.9 1.5 - 4.5 g/dL   Albumin/Globulin Ratio 2.3 (H) 1.2 - 2.2   Bilirubin Total 0.5 0.0 - 1.2 mg/dL   Alkaline Phosphatase 49 39 - 117 IU/L   AST 20 0 - 40 IU/L   ALT 22 0 - 32 IU/L      Assessment & Plan:  1. Trochanteric bursitis of left hip  Injection performed in clinic today. Hopefully this will provide patient some relief after multiple failed interventions.   - triamcinolone acetonide (KENALOG-40) injection 40 mg - lidocaine (XYLOCAINE) 1 % (with pres) injection 4  mL    Follow up plan: Return if symptoms worsen or fail to improve.   [facility] 06/06/2018, 1:46 PM

## 2018-06-05 NOTE — Patient Instructions (Signed)
Trochanteric Bursitis Trochanteric bursitis is a condition that causes hip pain. Trochanteric bursitis happens when fluid-filled sacs (bursae) in the hip get irritated. Normally these sacs absorb shock and help strong bands of tissue (tendons) in your hip glide smoothly over each other and over your hip bones. What are the causes? This condition results from increased friction between the hip bones and the tendons that go over them. This condition can happen if you:  Have weak hips.  Use your hip muscles too much (overuse).  Get hit in the hip.  What increases the risk? This condition is more likely to develop in:  Women.  Adults who are middle-aged or older.  People with arthritis or a spinal condition.  People with weak buttocks muscles (gluteal muscles).  People who have one leg that is shorter than the other.  People who participate in certain kinds of athletic activities, such as: ? Running sports, especially long-distance running. ? Contact sports, like football or martial arts. ? Sports in which falls may occur, like skiing.  What are the signs or symptoms? The main symptom of this condition is pain and tenderness over the point of your hip. The pain may be:  Sharp and intense.  Dull and achy.  Felt on the outside of your thigh.  It may increase when you:  Lie on your side.  Walk or run.  Go up on stairs.  Sit.  Stand up after sitting.  Stand for long periods of time.  How is this diagnosed? This condition may be diagnosed based on:  Your symptoms.  Your medical history.  A physical exam.  Imaging tests, such as: ? X-rays to check your bones. ? An MRI or ultrasound to check your tendons and muscles.  During your physical exam, your health care provider will check the movement and strength of your hip. He or she may press on the point of your hip to check for pain. How is this treated? This condition may be treated by:  Resting.  Reducing  your activity.  Avoiding activities that cause pain.  Using crutches, a cane, or a walker to decrease the strain on your hip.  Taking medicine to help with swelling.  Having medicine injected into the bursae to help with swelling.  Using ice, heat, and massage therapy for pain relief.  Physical therapy exercises for strength and flexibility.  Surgery (rare).  Follow these instructions at home: Activity  Rest.  Avoid activities that cause pain.  Return to your normal activities as told by your health care provider. Ask your health care provider what activities are safe for you. Managing pain, stiffness, and swelling  Take over-the-counter and prescription medicines only as told by your health care provider.  If directed, apply heat to the injured area as told by your health care provider. ? Place a towel between your skin and the heat source. ? Leave the heat on for 20-30 minutes. ? Remove the heat if your skin turns bright red. This is especially important if you are unable to feel pain, heat, or cold. You may have a greater risk of getting burned.  If directed, apply ice to the injured area: ? Put ice in a plastic bag. ? Place a towel between your skin and the bag. ? Leave the ice on for 20 minutes, 2-3 times a day. General instructions  If the affected leg is one that you use for driving, ask your health care provider when it is safe to drive.    Use crutches, a cane, or a walker as told by your health care provider.  If one of your legs is shorter than the other, get fitted for a shoe insert.  Lose weight if you are overweight. How is this prevented?  Wear supportive footwear that is appropriate for your sport.  If you have hip pain, start any new exercise or sport slowly.  Maintain physical fitness, including: ? Strength. ? Flexibility. Contact a health care provider if:  Your pain does not improve with 2-4 weeks. Get help right away if:  You develop  severe pain.  You have a fever.  You develop increased redness over your hip.  You have a change in your bowel function or bladder function.  You cannot control the muscles in your feet. This information is not intended to replace advice given to you by your health care provider. Make sure you discuss any questions you have with your health care provider. Document Released: 11/18/2004 Document Revised: 06/16/2016 Document Reviewed: 09/26/2015 Elsevier Interactive Patient Education  2018 [employer]  

## 2018-06-19 DIAGNOSIS — M25552 Pain in left hip: Secondary | ICD-10-CM | POA: Diagnosis not present

## 2018-06-19 DIAGNOSIS — M9903 Segmental and somatic dysfunction of lumbar region: Secondary | ICD-10-CM | POA: Diagnosis not present

## 2018-06-19 DIAGNOSIS — M5442 Lumbago with sciatica, left side: Secondary | ICD-10-CM | POA: Diagnosis not present

## 2018-06-23 ENCOUNTER — Ambulatory Visit (INDEPENDENT_AMBULATORY_CARE_PROVIDER_SITE_OTHER): Payer: Medicare Other

## 2018-06-23 VITALS — BP 118/66 | HR 66 | Temp 98.9°F | Resp 16 | Ht 62.5 in | Wt 161.9 lb

## 2018-06-23 DIAGNOSIS — Z0000 Encounter for general adult medical examination without abnormal findings: Secondary | ICD-10-CM | POA: Diagnosis not present

## 2018-06-23 NOTE — Patient Instructions (Addendum)
Breanna Davidson , Thank you for taking time to come for your Medicare Wellness Visit. I appreciate your ongoing commitment to your health goals. Please review the following plan we discussed and let me know if I can assist you in the future.   Screening recommendations/referrals: Colonoscopy: completed 06/04/2010 Mammogram: scheduled for 07/10/2018 Bone Density: completed 07/06/2016 Recommended yearly ophthalmology/optometry visit for glaucoma screening and checkup Recommended yearly dental visit for hygiene and checkup  Vaccinations: Influenza vaccine: due now- declined today  Pneumococcal vaccine: up to date Tdap vaccine: due, check with your insurance company for coverage  Shingles vaccine: shingrix eligible, check with your insurance company for coverage     Advanced directives:  Please bring a copy of your health care power of attorney and living will to the office at your convenience.  Conditions/risks identified: Recommend drinking at least 6-8 glasses of water a day   Next appointment: Follow up in one year for your annual wellness exam.    Preventive Care 65 Years and Older, Female Preventive care refers to lifestyle choices and visits with your health care provider that can promote health and wellness. What does preventive care include?  A yearly physical exam. This is also called an annual well check.  Dental exams once or twice a year.  Routine eye exams. Ask your health care provider how often you should have your eyes checked.  Personal lifestyle choices, including:  Daily care of your teeth and gums.  Regular physical activity.  Eating a healthy diet.  Avoiding tobacco and drug use.  Limiting alcohol use.  Practicing safe sex.  Taking low-dose aspirin every day.  Taking vitamin and mineral supplements as recommended by your health care provider. What happens during an annual well check? The services and screenings done by your health care provider during your  annual well check will depend on your age, overall health, lifestyle risk factors, and family history of disease. Counseling  Your health care provider may ask you questions about your:  Alcohol use.  Tobacco use.  Drug use.  Emotional well-being.  Home and relationship well-being.  Sexual activity.  Eating habits.  History of falls.  Memory and ability to understand (cognition).  Work and work Statistician.  Reproductive health. Screening  You may have the following tests or measurements:  Height, weight, and BMI.  Blood pressure.  Lipid and cholesterol levels. These may be checked every 5 years, or more frequently if you are over 33 years old.  Skin check.  Lung cancer screening. You may have this screening every year starting at age 78 if you have a 30-pack-year history of smoking and currently smoke or have quit within the past 15 years.  Fecal occult blood test (FOBT) of the stool. You may have this test every year starting at age 51.  Flexible sigmoidoscopy or colonoscopy. You may have a sigmoidoscopy every 5 years or a colonoscopy every 10 years starting at age 42.  Hepatitis C blood test.  Hepatitis B blood test.  Sexually transmitted disease (STD) testing.  Diabetes screening. This is done by checking your blood sugar (glucose) after you have not eaten for a while (fasting). You may have this done every 1-3 years.  Bone density scan. This is done to screen for osteoporosis. You may have this done starting at age 70.  Mammogram. This may be done every 1-2 years. Talk to your health care provider about how often you should have regular mammograms. Talk with your health care provider about your  test results, treatment options, and if necessary, the need for more tests. Vaccines  Your health care provider may recommend certain vaccines, such as:  Influenza vaccine. This is recommended every year.  Tetanus, diphtheria, and acellular pertussis (Tdap, Td)  vaccine. You may need a Td booster every 10 years.  Zoster vaccine. You may need this after age 49.  Pneumococcal 13-valent conjugate (PCV13) vaccine. One dose is recommended after age 53.  Pneumococcal polysaccharide (PPSV23) vaccine. One dose is recommended after age 57. Talk to your health care provider about which screenings and vaccines you need and how often you need them. This information is not intended to replace advice given to you by your health care provider. Make sure you discuss any questions you have with your health care provider. Document Released: 11/07/2015 Document Revised: 06/30/2016 Document Reviewed: 08/12/2015 Elsevier Interactive Patient Education  2017 [place] Prevention in the Home Falls can cause injuries. They can happen to people of all ages. There are many things you can do to make your home safe and to help prevent falls. What can I do on the outside of my home?  Regularly fix the edges of walkways and driveways and fix any cracks.  Remove anything that might make you trip as you walk through a door, such as a raised step or threshold.  Trim any bushes or trees on the path to your home.  Use bright outdoor lighting.  Clear any walking paths of anything that might make someone trip, such as rocks or tools.  Regularly check to see if handrails are loose or broken. Make sure that both sides of any steps have handrails.  Any raised decks and porches should have guardrails on the edges.  Have any leaves, snow, or ice cleared regularly.  Use sand or salt on walking paths during winter.  Clean up any spills in your garage right away. This includes oil or grease spills. What can I do in the bathroom?  Use night lights.  Install grab bars by the toilet and in the tub and shower. Do not use towel bars as grab bars.  Use non-skid mats or decals in the tub or shower.  If you need to sit down in the shower, use a plastic, non-slip  stool.  Keep the floor dry. Clean up any water that spills on the floor as soon as it happens.  Remove soap buildup in the tub or shower regularly.  Attach bath mats securely with double-sided non-slip rug tape.  Do not have throw rugs and other things on the floor that can make you trip. What can I do in the bedroom?  Use night lights.  Make sure that you have a light by your bed that is easy to reach.  Do not use any sheets or blankets that are too big for your bed. They should not hang down onto the floor.  Have a firm chair that has side arms. You can use this for support while you get dressed.  Do not have throw rugs and other things on the floor that can make you trip. What can I do in the kitchen?  Clean up any spills right away.  Avoid walking on wet floors.  Keep items that you use a lot in easy-to-reach places.  If you need to reach something above you, use a strong step stool that has a grab bar.  Keep electrical cords out of the way.  Do not use floor polish or wax that  makes floors slippery. If you must use wax, use non-skid floor wax.  Do not have throw rugs and other things on the floor that can make you trip. What can I do with my stairs?  Do not leave any items on the stairs.  Make sure that there are handrails on both sides of the stairs and use them. Fix handrails that are broken or loose. Make sure that handrails are as long as the stairways.  Check any carpeting to make sure that it is firmly attached to the stairs. Fix any carpet that is loose or worn.  Avoid having throw rugs at the top or bottom of the stairs. If you do have throw rugs, attach them to the floor with carpet tape.  Make sure that you have a light switch at the top of the stairs and the bottom of the stairs. If you do not have them, ask someone to add them for you. What else can I do to help prevent falls?  Wear shoes that:  Do not have high heels.  Have rubber bottoms.  Are  comfortable and fit you well.  Are closed at the toe. Do not wear sandals.  If you use a stepladder:  Make sure that it is fully opened. Do not climb a closed stepladder.  Make sure that both sides of the stepladder are locked into place.  Ask someone to hold it for you, if possible.  Clearly mark and make sure that you can see:  Any grab bars or handrails.  First and last steps.  Where the edge of each step is.  Use tools that help you move around (mobility aids) if they are needed. These include:  Canes.  Walkers.  Scooters.  Crutches.  Turn on the lights when you go into a dark area. Replace any light bulbs as soon as they burn out.  Set up your furniture so you have a clear path. Avoid moving your furniture around.  If any of your floors are uneven, fix them.  If there are any pets around you, be aware of where they are.  Review your medicines with your doctor. Some medicines can make you feel dizzy. This can increase your chance of falling. Ask your doctor what other things that you can do to help prevent falls. This information is not intended to replace advice given to you by your health care provider. Make sure you discuss any questions you have with your health care provider. Document Released: 08/07/2009 Document Revised: 03/18/2016 Document Reviewed: 11/15/2014 Elsevier Interactive Patient Education  2017 [employer].

## 2018-06-23 NOTE — Progress Notes (Signed)
Subjective:   Breanna Davidson is a 67 y.o. female who presents for Medicare Annual (Subsequent) preventive examination.  Review of Systems:  Cardiac Risk Factors include: advanced age (>19men, >37 women)     Objective:     Vitals: BP 118/66 (BP Location: Left Arm, Patient Position: Sitting)   Pulse 66   Temp 98.9 F (37.2 C) (Temporal)   Resp 16   Ht 5' 2.5" (1.588 m)   Wt 161 lb 14.4 oz (73.4 kg)   LMP  (LMP Unknown)   BMI 29.14 kg/m   Body mass index is 29.14 kg/m.  Advanced Directives 06/23/2018 06/23/2017 07/23/2016  Does Patient Have a Medical Advance Directive? Yes Yes Yes  Type of Paramedic of [place];Living will [place];Living will [place];Living will  Copy of [place] in Chart? No - copy requested No - copy requested -    Tobacco Social History   Tobacco Use  Smoking Status Never Smoker  Smokeless Tobacco Never Used     Counseling given: Not Answered   Clinical Intake:  Pre-visit preparation completed: Yes  Pain : 0-10 Pain Score: 3  Pain Type: Acute pain Pain Location: Leg Pain Orientation: Left Pain Radiating Towards: starts at hip and radiates to knee Pain Descriptors / Indicators: Aching, Tingling Pain Onset: More than a month ago Pain Frequency: Intermittent     Nutritional Status: BMI 25 -29 Overweight Nutritional Risks: None Diabetes: No  How often do you need to have someone help you when you read instructions, pamphlets, or other written materials from your doctor or pharmacy?: 1 - Never What is the last grade level you completed in school?: college   Interpreter Needed?: No  Information entered by :: Tiffany Hill,LPN   Past Medical History:  Diagnosis Date  . Allergy   . Hyperlipidemia   . Osteopenia    Past Surgical History:  Procedure Laterality Date  . APPENDECTOMY    . CESAREAN SECTION    . DILATION AND CURETTAGE OF UTERUS    .  REDUCTION MAMMAPLASTY  1988   Family History  Problem Relation Age of Onset  . Breast cancer Mother 53       and again at 85  . Lung disease Mother   . Cancer Mother   . Emphysema Mother   . Breast cancer Maternal Grandmother   . CAD Father   . Hyperlipidemia Sister   . Mental illness Daughter        bipolar  . Cystic fibrosis Daughter   . Breast cancer Maternal Aunt    Social History   Socioeconomic History  . Marital status: Married    Spouse name: Not on file  . Number of children: Not on file  . Years of education: college   . Highest education level: Not on file  Occupational History  . Not on file  Social Needs  . Financial resource strain: Not hard at all  . Food insecurity:    Worry: Never true    Inability: Never true  . Transportation needs:    Medical: No    Non-medical: No  Tobacco Use  . Smoking status: Never Smoker  . Smokeless tobacco: Never Used  Substance and Sexual Activity  . Alcohol use: Yes    Comment: 1-2 glasses a night   . Drug use: No  . Sexual activity: Yes  Lifestyle  . Physical activity:    Days per week: 0 days  Minutes per session: 0 min  . Stress: Not at all  Relationships  . Social connections:    Talks on phone: More than three times a week    Gets together: More than three times a week    Attends religious service: Never    Active member of club or organization: Yes    Attends meetings of clubs or organizations: More than 4 times per year    Relationship status: Married  Other Topics Concern  . Not on file  Social History Narrative  . Not on file    Outpatient Encounter Medications as of 06/23/2018  Medication Sig  . Calcium-Magnesium-Vitamin D (CALCIUM MAGNESIUM PO) Take by mouth daily.  . cetirizine (ZYRTEC) 10 MG tablet Take 10 mg by mouth daily.  . cholecalciferol (VITAMIN D) 1000 UNITS tablet Take 2,000 Units by mouth daily.  . Coenzyme Q10 (CO Q 10) 100 MG CAPS Take 100 mg by mouth daily.  . fluticasone  (FLONASE) 50 MCG/ACT nasal spray Place 2 sprays into both nostrils daily.  . Omega-3 Fatty Acids (FISH OIL) 1000 MG CAPS Take by mouth daily.  . Red Yeast Rice Extract (RED YEAST RICE PO) Take by mouth.  . TURMERIC PO Take by mouth daily.  . vitamin C (ASCORBIC ACID) 500 MG tablet Take 500 mg by mouth daily.   No facility-administered encounter medications on file as of 06/23/2018.     Activities of Daily Living In your present state of health, do you have any difficulty performing the following activities: 06/23/2018 06/23/2017  Hearing? N N  Vision? N N  Difficulty concentrating or making decisions? N N  Walking or climbing stairs? Y N  Comment some knee pain  -  Dressing or bathing? N N  Doing errands, shopping? N N  Preparing Food and eating ? N N  Using the Toilet? N N  In the past six months, have you accidently leaked urine? N N  Do you have problems with loss of bowel control? N N  Managing your Medications? N N  Managing your Finances? N N  Housekeeping or managing your Housekeeping? N N  Some recent data might be hidden    Patient Care Team: Kathrine Haddock, NP as PCP - General (Nurse Practitioner)    Assessment:   This is a routine wellness examination for Kizzie.  Exercise Activities and Dietary recommendations Current Exercise Habits: Home exercise routine, Type of exercise: walking, Time (Minutes): 30, Frequency (Times/Week): 5, Weekly Exercise (Minutes/Week): 150, Intensity: Mild, Exercise limited by: None identified  Goals    . DIET - INCREASE WATER INTAKE     Recommend drinking at least 6-8 glasses of water a day        Fall Risk Fall Risk  06/23/2018 06/23/2017 07/23/2016 03/19/2016  Falls in the past year? Yes No No No  Number falls in past yr: 1 - - -  Injury with Fall? No - - -   Is the patient's home free of loose throw rugs in walkways, pet beds, electrical cords, etc?   yes      Grab bars in the bathroom? no      Handrails on the stairs?   yes       Adequate lighting?   yes  Timed Get Up and Go performed: Completed in 8 seconds with no use of assistive devices, steady gait. No intervention needed at this time.   Depression Screen PHQ 2/9 Scores 06/23/2018 06/23/2017 07/23/2016 03/19/2016  PHQ - 2 Score 0  0 0 0  PHQ- 9 Score - - 1 -     Cognitive Function     6CIT Screen 06/23/2018 06/23/2017  What Year? 0 points 0 points  What month? 0 points 0 points  What time? 0 points 0 points  Count back from 20 0 points 0 points  Months in reverse 0 points 0 points  Repeat phrase 0 points 0 points  Total Score 0 0    Immunization History  Administered Date(s) Administered  . Influenza, High Dose Seasonal PF 07/23/2016, 07/26/2017  . Pneumococcal Conjugate-13 07/23/2016  . Pneumococcal Polysaccharide-23 07/26/2017  . Td 11/13/2004  . Zoster 09/12/2015    Qualifies for Shingles Vaccine? Yes, discussed shingrix vaccine   Screening Tests Health Maintenance  Topic Date Due  . INFLUENZA VACCINE  05/25/2018  . TETANUS/TDAP  06/06/2019 (Originally 11/13/2014)  . MAMMOGRAM  07/09/2019  . COLONOSCOPY  06/04/2020  . DEXA SCAN  Completed  . Hepatitis C Screening  Completed  . PNA vac Low Risk Adult  Completed    Cancer Screenings: Lung: Low Dose CT Chest recommended if Age 12-80 years, 30 pack-year currently smoking OR have quit w/in 15years. Patient does not qualify. Breast:  Up to date on Mammogram? Yes  Scheduled for 07/10/2018 Up to date of Bone Density/Dexa? Yes 07/06/2016 Colorectal: completed 06/04/2010  Additional Screenings:  Hepatitis C Screening: completed 03/19/2016     Plan:  I have personally reviewed and addressed the Medicare Annual Wellness questionnaire and have noted the following in the patient's chart:  A. Medical and social history B. Use of alcohol, tobacco or illicit drugs  C. Current medications and supplements D. Functional ability and status E.  Nutritional status F.  Physical activity G. Advance  directives H. List of other physicians I.  Hospitalizations, surgeries, and ER visits in previous 12 months J.  [place] such as hearing and vision if needed, cognitive and depression L. Referrals and appointments   In addition, I have reviewed and discussed with patient certain preventive protocols, quality metrics, and best practice recommendations. A written personalized care plan for preventive services as well as general preventive health recommendations were provided to patient.   Signed,  Tyler Aas, LPN Nurse Health Advisor   Nurse Notes:none

## 2018-07-10 ENCOUNTER — Ambulatory Visit
Admission: RE | Admit: 2018-07-10 | Discharge: 2018-07-10 | Disposition: A | Discharge status: Home / Self Care | Payer: Medicare Other | Source: Ambulatory Visit | Attending: Family Medicine | Admitting: Family Medicine

## 2018-07-10 DIAGNOSIS — Z1231 Encounter for screening mammogram for malignant neoplasm of breast: Secondary | ICD-10-CM | POA: Insufficient documentation

## 2018-07-19 DIAGNOSIS — H2511 Age-related nuclear cataract, right eye: Secondary | ICD-10-CM | POA: Diagnosis not present

## 2018-08-01 ENCOUNTER — Ambulatory Visit
Admission: RE | Admit: 2018-08-01 | Discharge: 2018-08-01 | Disposition: A | Discharge status: Home / Self Care | Payer: Medicare Other | Source: Ambulatory Visit | Attending: Nurse Practitioner | Admitting: Nurse Practitioner

## 2018-08-01 ENCOUNTER — Ambulatory Visit: Payer: Medicare Other | Admitting: Nurse Practitioner

## 2018-08-01 ENCOUNTER — Encounter: Payer: Self-pay | Admitting: Nurse Practitioner

## 2018-08-01 VITALS — BP 119/77 | HR 62 | Temp 98.8°F | Ht 61.42 in | Wt 162.4 lb

## 2018-08-01 DIAGNOSIS — M8580 Other specified disorders of bone density and structure, unspecified site: Secondary | ICD-10-CM | POA: Diagnosis not present

## 2018-08-01 DIAGNOSIS — Z131 Encounter for screening for diabetes mellitus: Secondary | ICD-10-CM | POA: Diagnosis not present

## 2018-08-01 DIAGNOSIS — G8929 Other chronic pain: Secondary | ICD-10-CM | POA: Diagnosis not present

## 2018-08-01 DIAGNOSIS — M5136 Other intervertebral disc degeneration, lumbar region: Secondary | ICD-10-CM | POA: Diagnosis not present

## 2018-08-01 DIAGNOSIS — M545 Low back pain: Secondary | ICD-10-CM | POA: Diagnosis not present

## 2018-08-01 DIAGNOSIS — I10 Essential (primary) hypertension: Secondary | ICD-10-CM | POA: Diagnosis not present

## 2018-08-01 DIAGNOSIS — Z23 Encounter for immunization: Secondary | ICD-10-CM

## 2018-08-01 DIAGNOSIS — Z0000 Encounter for general adult medical examination without abnormal findings: Secondary | ICD-10-CM | POA: Diagnosis not present

## 2018-08-01 DIAGNOSIS — E7800 Pure hypercholesterolemia, unspecified: Secondary | ICD-10-CM | POA: Diagnosis not present

## 2018-08-01 DIAGNOSIS — Z1329 Encounter for screening for other suspected endocrine disorder: Secondary | ICD-10-CM | POA: Diagnosis not present

## 2018-08-01 DIAGNOSIS — S3992XA Unspecified injury of lower back, initial encounter: Secondary | ICD-10-CM | POA: Diagnosis not present

## 2018-08-01 NOTE — Patient Instructions (Signed)

## 2018-08-01 NOTE — Progress Notes (Signed)
BP 119/77 (BP Location: Left Arm, Patient Position: Sitting, Cuff Size: Normal)   Pulse 62   Temp 98.8 F (37.1 C)   Ht 5' 1.42" (1.56 m)   Wt 162 lb 6 oz (73.7 kg)   LMP  (LMP Unknown)   SpO2 98%   BMI 30.27 kg/m    Subjective:    Patient ID: Breanna Davidson, female    DOB: 04-15-1951, 67 y.o.   MRN: [id number]  HPI: Parul Porcelli is a 67 y.o. female presents for annual physical.  Chief Complaint  Patient presents with  . Back Pain   BACK PAIN Duration: months, since fall in December in 2017 Mechanism of injury: [place] on bottom in December 2017 in driveway on ice.  Also has h/o fracture to coccyx  3 years ago with birth of child. Location: lower back and at times left hip Onset: gradual Severity: 7/10 at worst and 0/10 at best. Is improving some, but continues to be an ongoing issue.   Quality: dull Frequency: intermittent Radiation: none Aggravating factors: walking Alleviating factors: resting or massage Status: fluctuating, currently a little better Treatments attempted: ice, heat, laying, NSAIDs and APAP, physical therapy, CBD oil, massage Relief with NSAIDs?: mild Nighttime pain:  no Paresthesias / decreased sensation:  no Bowel / bladder incontinence:  no Fevers:  no Dysuria / urinary frequency:  no  This has been ongoing issue for several months, for which has attempted multiple tools and medications.     Annual physical: No new issues per patient report.  She reports she is "doing very well".  Annual labs ordered.  Patient agreed with POC.  Relevant past medical, surgical, family and social history reviewed and updated as indicated. Interim medical history since our last visit reviewed. Allergies and medications reviewed and updated.  Review of Systems  Constitutional: Negative for activity change, appetite change and fatigue.  HENT: Negative.   Eyes: Negative.   Respiratory: Negative for cough, chest tightness and shortness of breath.     Cardiovascular: Negative for chest pain, palpitations and leg swelling.  Gastrointestinal: Negative for abdominal distention and abdominal pain.  Endocrine: Negative.   Genitourinary: Negative.   Musculoskeletal: Positive for back pain. Negative for joint swelling, myalgias, neck pain and neck stiffness.  Skin: Negative.   Allergic/Immunologic: Negative.   Neurological: Negative for dizziness, numbness and headaches.  Hematological: Negative.   Psychiatric/Behavioral: Negative.     Per HPI unless specifically indicated above     Objective:    BP 119/77 (BP Location: Left Arm, Patient Position: Sitting, Cuff Size: Normal)   Pulse 62   Temp 98.8 F (37.1 C)   Ht 5' 1.42" (1.56 m)   Wt 162 lb 6 oz (73.7 kg)   LMP  (LMP Unknown)   SpO2 98%   BMI 30.27 kg/m   Wt Readings from Last 3 Encounters:  08/01/18 162 lb 6 oz (73.7 kg)  06/23/18 161 lb 14.4 oz (73.4 kg)  06/05/18 162 lb 3.2 oz (73.6 kg)    Physical Exam  Constitutional: She is oriented to person, place, and time. She appears well-developed and well-nourished.  HENT:  Head: Normocephalic and atraumatic.  Right Ear: External ear normal.  Left Ear: External ear normal.  Nose: Nose normal.  Mouth/Throat: Oropharynx is clear and moist.  Eyes: Pupils are equal, round, and reactive to light. EOM are normal.  Neck: Normal range of motion. Neck supple.  Cardiovascular: Normal rate, regular rhythm, normal heart sounds and intact distal pulses.  Pulmonary/Chest: Effort normal and breath sounds normal.  Abdominal: Soft. Bowel sounds are normal.  Genitourinary:  Genitourinary Comments: Deferred per patient request.  Musculoskeletal: Normal range of motion.  Neurological: She is alert and oriented to person, place, and time.  Skin: Skin is warm and dry.  Psychiatric: She has a normal mood and affect. Her behavior is normal.  Nursing note and vitals reviewed.  Breast exam bilaterally with no abnormalities noted.   Results  for orders placed or performed in visit on 07/26/17  Lipid Panel w/o Chol/HDL Ratio  Result Value Ref Range   Cholesterol, Total 293 (H) 100 - 199 mg/dL   Triglycerides 67 0 - 149 mg/dL   HDL 94 >39 mg/dL   VLDL Cholesterol Cal 13 5 - 40 mg/dL   LDL Calculated 186 (H) 0 - 99 mg/dL  Comprehensive metabolic panel  Result Value Ref Range   Glucose 78 65 - 99 mg/dL   BUN 20 8 - 27 mg/dL   Creatinine, Ser 0.82 0.57 - 1.00 mg/dL   GFR calc non Af Amer 75 >59 mL/min/1.73   GFR calc Af Amer 86 >59 mL/min/1.73   BUN/Creatinine Ratio 24 12 - 28   Sodium 141 134 - 144 mmol/L   Potassium 4.5 3.5 - 5.2 mmol/L   Chloride 104 96 - 106 mmol/L   CO2 23 20 - 29 mmol/L   Calcium 9.4 8.7 - 10.3 mg/dL   Total Protein 6.2 6.0 - 8.5 g/dL   Albumin 4.3 3.6 - 4.8 g/dL   Globulin, Total 1.9 1.5 - 4.5 g/dL   Albumin/Globulin Ratio 2.3 (H) 1.2 - 2.2   Bilirubin Total 0.5 0.0 - 1.2 mg/dL   Alkaline Phosphatase 49 39 - 117 IU/L   AST 20 0 - 40 IU/L   ALT 22 0 - 32 IU/L      Assessment & Plan:   Problem List Items Addressed This Visit    None    Visit Diagnoses    Immunization due    -  Primary   Relevant Orders   Flu vaccine HIGH DOSE PF (Fluzone High dose) (Completed)   Chronic midline low back pain without sciatica  (Chronic)      Chronic, ongoing since fall in 2017.  Order for lumbar back xray and referral to ortho.  Patient has tried PT, yoga, massage, Alleve, Tylenol, stretching.     Relevant Orders   DG Lumbar Spine Complete   Ambulatory referral to Orthopedic Surgery   Annual physical exam       annual labs ordered   Relevant Orders   COMPLETE METABOLIC PANEL WITH GFR   CBC   TSH   HgB A1c   Lipid Profile   Vitamin D (25 hydroxy)       Follow up plan: Return in about 6 months (around 01/31/2019), or if symptoms worsen or fail to improve, for back pain follow-up.

## 2018-08-02 ENCOUNTER — Other Ambulatory Visit: Payer: Self-pay | Admitting: Family Medicine

## 2018-08-02 LAB — CBC
Hematocrit: 36.3 % (ref 34.0–46.6)
Hemoglobin: 12 g/dL (ref 11.1–15.9)
MCH: 30 pg (ref 26.6–33.0)
MCHC: 33.1 g/dL (ref 31.5–35.7)
MCV: 91 fL (ref 79–97)
Platelets: 294 10*3/uL (ref 150–450)
RBC: 4 x10E6/uL (ref 3.77–5.28)
RDW: 13.5 % (ref 12.3–15.4)
WBC: 6.8 10*3/uL (ref 3.4–10.8)

## 2018-08-02 LAB — HEMOGLOBIN A1C
ESTIMATED AVERAGE GLUCOSE: 111 mg/dL
HEMOGLOBIN A1C: 5.5 % (ref 4.8–5.6)

## 2018-08-02 LAB — LIPID PANEL
CHOL/HDL RATIO: 3.2 ratio (ref 0.0–4.4)
Cholesterol, Total: 274 mg/dL — ABNORMAL HIGH (ref 100–199)
HDL: 85 mg/dL (ref >39)
LDL Calculated: 174 mg/dL — ABNORMAL HIGH (ref 0–99)
Triglycerides: 74 mg/dL (ref 0–149)
VLDL Cholesterol Cal: 15 mg/dL (ref 5–40)

## 2018-08-02 LAB — TSH: TSH: 1.99 u[IU]/mL (ref 0.450–4.500)

## 2018-08-02 LAB — VITAMIN D 25 HYDROXY (VIT D DEFICIENCY, FRACTURES): VIT D 25 HYDROXY: 45.2 ng/mL (ref 30.0–100.0)

## 2018-08-07 DIAGNOSIS — M4310 Spondylolisthesis, site unspecified: Secondary | ICD-10-CM | POA: Diagnosis not present

## 2018-08-07 DIAGNOSIS — G8929 Other chronic pain: Secondary | ICD-10-CM | POA: Diagnosis not present

## 2018-08-07 DIAGNOSIS — M5442 Lumbago with sciatica, left side: Secondary | ICD-10-CM | POA: Diagnosis not present

## 2018-08-07 DIAGNOSIS — M5136 Other intervertebral disc degeneration, lumbar region: Secondary | ICD-10-CM | POA: Diagnosis not present

## 2018-08-08 ENCOUNTER — Other Ambulatory Visit: Payer: Self-pay | Admitting: Sports Medicine

## 2018-08-08 DIAGNOSIS — M5442 Lumbago with sciatica, left side: Secondary | ICD-10-CM

## 2018-09-01 ENCOUNTER — Ambulatory Visit
Admission: RE | Admit: 2018-09-01 | Discharge: 2018-09-01 | Disposition: A | Discharge status: Home / Self Care | Payer: Medicare Other | Source: Ambulatory Visit | Attending: Sports Medicine | Admitting: Sports Medicine

## 2018-09-01 DIAGNOSIS — M5442 Lumbago with sciatica, left side: Secondary | ICD-10-CM | POA: Diagnosis present

## 2018-09-01 DIAGNOSIS — M48061 Spinal stenosis, lumbar region without neurogenic claudication: Secondary | ICD-10-CM | POA: Insufficient documentation

## 2018-09-01 DIAGNOSIS — M5126 Other intervertebral disc displacement, lumbar region: Secondary | ICD-10-CM | POA: Diagnosis not present

## 2018-09-01 DIAGNOSIS — M4316 Spondylolisthesis, lumbar region: Secondary | ICD-10-CM | POA: Diagnosis not present

## 2018-09-01 DIAGNOSIS — M545 Low back pain: Secondary | ICD-10-CM | POA: Diagnosis not present

## 2018-09-05 DIAGNOSIS — M5136 Other intervertebral disc degeneration, lumbar region: Secondary | ICD-10-CM | POA: Diagnosis not present

## 2018-09-05 DIAGNOSIS — M4310 Spondylolisthesis, site unspecified: Secondary | ICD-10-CM | POA: Diagnosis not present

## 2018-09-05 DIAGNOSIS — M48062 Spinal stenosis, lumbar region with neurogenic claudication: Secondary | ICD-10-CM | POA: Diagnosis not present

## 2018-09-05 DIAGNOSIS — M5417 Radiculopathy, lumbosacral region: Secondary | ICD-10-CM | POA: Diagnosis not present

## 2018-09-05 DIAGNOSIS — M5442 Lumbago with sciatica, left side: Secondary | ICD-10-CM | POA: Diagnosis not present

## 2018-09-26 DIAGNOSIS — M5136 Other intervertebral disc degeneration, lumbar region: Secondary | ICD-10-CM | POA: Diagnosis not present

## 2018-09-26 DIAGNOSIS — M48062 Spinal stenosis, lumbar region with neurogenic claudication: Secondary | ICD-10-CM | POA: Diagnosis not present

## 2018-09-26 DIAGNOSIS — M5416 Radiculopathy, lumbar region: Secondary | ICD-10-CM | POA: Diagnosis not present

## 2018-10-03 DIAGNOSIS — D2262 Melanocytic nevi of left upper limb, including shoulder: Secondary | ICD-10-CM | POA: Diagnosis not present

## 2018-10-03 DIAGNOSIS — D2271 Melanocytic nevi of right lower limb, including hip: Secondary | ICD-10-CM | POA: Diagnosis not present

## 2018-10-03 DIAGNOSIS — D2261 Melanocytic nevi of right upper limb, including shoulder: Secondary | ICD-10-CM | POA: Diagnosis not present

## 2018-10-03 DIAGNOSIS — D225 Melanocytic nevi of trunk: Secondary | ICD-10-CM | POA: Diagnosis not present

## 2018-10-03 DIAGNOSIS — D2272 Melanocytic nevi of left lower limb, including hip: Secondary | ICD-10-CM | POA: Diagnosis not present

## 2018-10-27 DIAGNOSIS — M48062 Spinal stenosis, lumbar region with neurogenic claudication: Secondary | ICD-10-CM | POA: Diagnosis not present

## 2018-10-27 DIAGNOSIS — M5416 Radiculopathy, lumbar region: Secondary | ICD-10-CM | POA: Diagnosis not present

## 2018-10-27 DIAGNOSIS — M5136 Other intervertebral disc degeneration, lumbar region: Secondary | ICD-10-CM | POA: Diagnosis not present

## 2018-12-12 ENCOUNTER — Encounter: Payer: Self-pay | Admitting: Nurse Practitioner

## 2018-12-12 DIAGNOSIS — M5136 Other intervertebral disc degeneration, lumbar region: Secondary | ICD-10-CM | POA: Insufficient documentation

## 2018-12-12 DIAGNOSIS — M48062 Spinal stenosis, lumbar region with neurogenic claudication: Secondary | ICD-10-CM | POA: Insufficient documentation

## 2018-12-12 DIAGNOSIS — Z981 Arthrodesis status: Secondary | ICD-10-CM | POA: Insufficient documentation

## 2018-12-12 DIAGNOSIS — M5442 Lumbago with sciatica, left side: Secondary | ICD-10-CM

## 2018-12-12 DIAGNOSIS — M5417 Radiculopathy, lumbosacral region: Secondary | ICD-10-CM | POA: Insufficient documentation

## 2018-12-12 DIAGNOSIS — G8929 Other chronic pain: Secondary | ICD-10-CM | POA: Insufficient documentation

## 2018-12-13 ENCOUNTER — Other Ambulatory Visit: Payer: Self-pay

## 2018-12-13 ENCOUNTER — Ambulatory Visit (INDEPENDENT_AMBULATORY_CARE_PROVIDER_SITE_OTHER): Payer: Medicare Other | Admitting: Nurse Practitioner

## 2018-12-13 ENCOUNTER — Encounter: Payer: Self-pay | Admitting: Nurse Practitioner

## 2018-12-13 VITALS — BP 116/76 | HR 62 | Temp 98.3°F | Ht 61.4 in | Wt 164.0 lb

## 2018-12-13 DIAGNOSIS — M5442 Lumbago with sciatica, left side: Secondary | ICD-10-CM | POA: Diagnosis not present

## 2018-12-13 DIAGNOSIS — F330 Major depressive disorder, recurrent, mild: Secondary | ICD-10-CM

## 2018-12-13 DIAGNOSIS — G8929 Other chronic pain: Secondary | ICD-10-CM | POA: Diagnosis not present

## 2018-12-13 DIAGNOSIS — Z1329 Encounter for screening for other suspected endocrine disorder: Secondary | ICD-10-CM

## 2018-12-13 MED ORDER — DULOXETINE HCL 30 MG PO CPEP: 30.0000 mg | ORAL_CAPSULE | Freq: Every day | ORAL | 3 refills | Status: DC

## 2018-12-13 NOTE — Assessment & Plan Note (Signed)
PHQ9 = 5 today.  Situational change in mood secondary to recent back pain.  Will trial Duloxetine, start at 30 MG, which may benefit mood and pain level.  Return in 4 weeks for follow-up.  Adjust dose as needed.  Provided lengthy education to patient on medication and risk/benefit.

## 2018-12-13 NOTE — Progress Notes (Signed)
BP 116/76   Pulse 62   Temp 98.3 F (36.8 C) (Oral)   Ht 5' 1.4" (1.56 m)   Wt 164 lb (74.4 kg)   LMP  (LMP Unknown)   SpO2 96%   BMI 30.59 kg/m    Subjective:    Patient ID: Breanna Davidson, female    DOB: September 24, 1951, 68 y.o.   MRN: [id number]  HPI: Breanna Davidson is a 68 y.o. female  Chief Complaint  Patient presents with  . Back Pain    f/u pt states if she can get something for stress  . Neck Pain  . 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  Has history of fall in 2017 and since this time has had ongoing back and left-sided discomfort.  Also has h/o fracture to coccyx 47 years ago with childbirth.  At this time she is receiving epidural injections by ortho, her last one was 10/27/2018.  She reports these have not been working. They tried Gabapentin with her for sciatic pain, but she reports this made her lethargic.  She states that at this time her mood is being affected by the pain.  Is aware that surgery may be an option if other methods do not work.  She endorses at baseline being very active, traveling [place] often with her husband, and that this recent diagnosis and ongoing pain is causing her not to be able to function to her previous level.  DEPRESSION Endorses increase in depressed mood secondary to recent diagnosis with back and ongoing discomfort.  Would like thyroid test today, as she reports with her depressed mood she is having some hair loss.  Previously has taken Citalopram for mood. Mood status: uncontrolled, secondary to pain Satisfied with current treatment?: no current treatment Symptom severity: mild  Previous psychiatric medications: none Anxious mood: no Anhedonia: no Significant weight loss or gain: no Insomnia: yes hard to fall asleep Fatigue: no Feelings of worthlessness or guilt: yes Impaired concentration/indecisiveness:  no Suicidal ideations: no Hopelessness: no Crying spells: yes Depression screen [facility] 2/9 12/13/2018 06/23/2018 06/23/2017 07/23/2016 03/19/2016  Decreased Interest 2 0 0 0 0  Down, Depressed, Hopeless 2 0 0 0 0  PHQ - 2 Score 4 0 0 0 0  Altered sleeping 1 - - 1 -  Tired, decreased energy 0 - - 0 -  Change in appetite 0 - - 0 -  Feeling bad or failure about yourself  0 - - 0 -  Trouble concentrating 0 - - 0 -  Moving slowly or fidgety/restless 0 - - 0 -  Suicidal thoughts 0 - - 0 -  PHQ-9 Score 5 - - 1 -  Difficult doing work/chores Somewhat difficult - - - -   GAD 7 : Generalized Anxiety Score 12/13/2018  Nervous, Anxious, on Edge 1  Control/stop worrying 2  Worry too much - different things 0  Trouble relaxing 0  Restless 0  Easily annoyed or irritable 0  Afraid - awful might happen 0  Total GAD 7 Score 3  Anxiety Difficulty Not difficult at all     Relevant past medical, surgical, family and social history reviewed and updated as indicated. Interim medical history since our last visit reviewed. Allergies and medications reviewed and updated.  Review of Systems  Constitutional: Negative for activity change, appetite change, diaphoresis, fatigue and fever.  Respiratory:  Negative for cough, chest tightness and shortness of breath.   Cardiovascular: Negative for chest pain, palpitations and leg swelling.  Gastrointestinal: Negative for abdominal distention, abdominal pain, constipation, diarrhea, nausea and vomiting.  Endocrine: Negative for cold intolerance, heat intolerance, polydipsia, polyphagia and polyuria.  Musculoskeletal: Positive for back pain (with sciatica).  Neurological: Negative for dizziness, syncope, weakness, light-headedness, numbness and headaches.  Psychiatric/Behavioral: Positive for sleep disturbance. Negative for decreased concentration, self-injury and suicidal ideas. The patient is nervous/anxious.     Per HPI unless specifically indicated above       Objective:    BP 116/76   Pulse 62   Temp 98.3 F (36.8 C) (Oral)   Ht 5' 1.4" (1.56 m)   Wt 164 lb (74.4 kg)   LMP  (LMP Unknown)   SpO2 96%   BMI 30.59 kg/m   Wt Readings from Last 3 Encounters:  12/13/18 164 lb (74.4 kg)  08/01/18 162 lb 6 oz (73.7 kg)  06/23/18 161 lb 14.4 oz (73.4 kg)    Physical Exam Vitals signs and nursing note reviewed.  Constitutional:      General: She is awake.     Appearance: She is well-developed.  HENT:     Head: Normocephalic.     Right Ear: Hearing normal.     Left Ear: Hearing normal.     Nose: Nose normal.     Mouth/Throat:     Mouth: Mucous membranes are moist.  Eyes:     General: Lids are normal.        Right eye: No discharge.        Left eye: No discharge.     Conjunctiva/sclera: Conjunctivae normal.     Pupils: Pupils are equal, round, and reactive to light.  Neck:     Musculoskeletal: Normal range of motion and neck supple.     Thyroid: No thyromegaly.     Vascular: No carotid bruit or JVD.  Cardiovascular:     Rate and Rhythm: Normal rate and regular rhythm.     Heart sounds: Normal heart sounds. No murmur. No gallop.   Pulmonary:     Effort: Pulmonary effort is normal.     Breath sounds: Normal breath sounds.  Abdominal:     General: Bowel sounds are normal.     Palpations: Abdomen is soft. There is no hepatomegaly or splenomegaly.  Musculoskeletal:     Right lower leg: No edema.     Left lower leg: No edema.  Lymphadenopathy:     Cervical: No cervical adenopathy.  Skin:    General: Skin is warm and dry.  Neurological:     Mental Status: She is alert and oriented to person, place, and time.  Psychiatric:        Attention and Perception: Attention normal.        Mood and Affect: Mood normal.        Behavior: Behavior normal. Behavior is cooperative.        Thought Content: Thought content normal.        Judgment: Judgment normal.     Comments: Intermittent periods of tearfulness as she discussed her back  discomfort and current ADL abilities.     Results for orders placed or performed in visit on 08/01/18  CBC  Result Value Ref Range   WBC 6.8 3.4 - 10.8 x10E3/uL   RBC 4.00 3.77 - 5.28 x10E6/uL   Hemoglobin 12.0 11.1 - 15.9 g/dL   Hematocrit 36.3 34.0 - 46.6 %  MCV 91 79 - 97 fL   MCH 30.0 26.6 - 33.0 pg   MCHC 33.1 31.5 - 35.7 g/dL   RDW 13.5 12.3 - 15.4 %   Platelets 294 150 - 450 x10E3/uL  TSH  Result Value Ref Range   TSH 1.990 0.450 - 4.500 uIU/mL  HgB A1c  Result Value Ref Range   Hgb A1c MFr Bld 5.5 4.8 - 5.6 %   Est. average glucose Bld gHb Est-mCnc 111 mg/dL  Lipid Profile  Result Value Ref Range   Cholesterol, Total 274 (H) 100 - 199 mg/dL   Triglycerides 74 0 - 149 mg/dL   HDL 85 >39 mg/dL   VLDL Cholesterol Cal 15 5 - 40 mg/dL   LDL Calculated 174 (H) 0 - 99 mg/dL   Chol/HDL Ratio 3.2 0.0 - 4.4 ratio  Vitamin D (25 hydroxy)  Result Value Ref Range   Vit D, 25-Hydroxy 45.2 30.0 - 100.0 ng/mL      Assessment & Plan:   Problem List Items Addressed This Visit      Nervous and Auditory   Chronic left-sided low back pain with left-sided sciatica    Continue to collaborate with orthopedics.  Will trial low dose of Duloxetine which may benefit mood and pain.  Return in 4 weeks for follow-up.        Relevant Medications   DULoxetine (CYMBALTA) 30 MG capsule     Other   Depression - Primary    PHQ9 = 5 today.  Situational change in mood secondary to recent back pain.  Will trial Duloxetine, start at 30 MG, which may benefit mood and pain level.  Return in 4 weeks for follow-up.  Adjust dose as needed.  Provided lengthy education to patient on medication and risk/benefit.      Relevant Medications   DULoxetine (CYMBALTA) 30 MG capsule    Other Visit Diagnoses    Thyroid disorder screen       Relevant Orders   TSH       Follow up plan: Return in about 4 weeks (around 01/10/2019) for Back pain/depression.

## 2018-12-13 NOTE — Assessment & Plan Note (Signed)
Continue to collaborate with orthopedics.  Will trial low dose of Duloxetine which may benefit mood and pain.  Return in 4 weeks for follow-up.

## 2018-12-13 NOTE — Patient Instructions (Signed)

## 2018-12-14 LAB — TSH: TSH: 1.74 u[IU]/mL (ref 0.450–4.500)

## 2019-01-16 ENCOUNTER — Ambulatory Visit: Payer: Medicare Other | Admitting: Nurse Practitioner

## 2019-01-31 ENCOUNTER — Ambulatory Visit: Payer: Medicare Other | Admitting: Nurse Practitioner

## 2019-02-13 ENCOUNTER — Ambulatory Visit (INDEPENDENT_AMBULATORY_CARE_PROVIDER_SITE_OTHER): Payer: Medicare Other | Admitting: Nurse Practitioner

## 2019-02-13 ENCOUNTER — Encounter: Payer: Self-pay | Admitting: Nurse Practitioner

## 2019-02-13 ENCOUNTER — Other Ambulatory Visit: Payer: Self-pay

## 2019-02-13 VITALS — Ht 62.0 in | Wt 160.0 lb

## 2019-02-13 DIAGNOSIS — G8929 Other chronic pain: Secondary | ICD-10-CM | POA: Diagnosis not present

## 2019-02-13 DIAGNOSIS — M48062 Spinal stenosis, lumbar region with neurogenic claudication: Secondary | ICD-10-CM

## 2019-02-13 DIAGNOSIS — F330 Major depressive disorder, recurrent, mild: Secondary | ICD-10-CM | POA: Diagnosis not present

## 2019-02-13 DIAGNOSIS — M5442 Lumbago with sciatica, left side: Secondary | ICD-10-CM | POA: Diagnosis not present

## 2019-02-13 NOTE — Assessment & Plan Note (Signed)
Chronic, ongoing.  Continue Duloxetine and referral placed to neurosurgery for further discussion of surgical options.

## 2019-02-13 NOTE — Patient Instructions (Signed)
Sciatica    Sciatica is pain, numbness, weakness, or tingling along your sciatic nerve. The sciatic nerve starts in the lower back and goes down the back of each leg. Sciatica happens when this nerve is pinched or has pressure put on it. Sciatica usually goes away on its own or with treatment. Sometimes, sciatica may keep coming back (recur).  Follow these instructions at home:  Medicines  · Take over-the-counter and prescription medicines only as told by your doctor.  · Do not drive or use heavy machinery while taking prescription pain medicine.  Managing pain  · If directed, put ice on the affected area.  ? Put ice in a plastic bag.  ? Place a towel between your skin and the bag.  ? Leave the ice on for 20 minutes, 2-3 times a day.  · After icing, apply heat to the affected area before you exercise or as often as told by your doctor. Use the heat source that your doctor tells you to use, such as a moist heat pack or a heating pad.  ? Place a towel between your skin and the heat source.  ? Leave the heat on for 20-30 minutes.  ? Remove the heat if your skin turns bright red. This is especially important if you are unable to feel pain, heat, or cold. You may have a greater risk of getting burned.  Activity  · Return to your normal activities as told by your doctor. Ask your doctor what activities are safe for you.  ? Avoid activities that make your sciatica worse.  · Take short rests during the day. Rest in a lying or standing position. This is usually better than sitting to rest.  ? When you rest for a long time, do some physical activity or stretching between periods of rest.  ? Avoid sitting for a long time without moving. Get up and move around at least one time each hour.  · Exercise and stretch regularly, as told by your doctor.  · Do not lift anything that is heavier than 10 lb (4.5 kg) while you have symptoms of sciatica.  ? Avoid lifting heavy things even when you do not have symptoms.  ? Avoid lifting  heavy things over and over.  · When you lift objects, always lift in a way that is safe for your body. To do this, you should:  ? Bend your knees.  ? Keep the object close to your body.  ? Avoid twisting.  General instructions  · Use good posture.  ? Avoid leaning forward when you are sitting.  ? Avoid hunching over when you are standing.  · Stay at a healthy weight.  · Wear comfortable shoes that support your feet. Avoid wearing high heels.  · Avoid sleeping on a mattress that is too soft or too hard. You might have less pain if you sleep on a mattress that is firm enough to support your back.  · Keep all follow-up visits as told by your doctor. This is important.  Contact a doctor if:  · You have pain that:  ? Wakes you up when you are sleeping.  ? Gets worse when you lie down.  ? Is worse than the pain you have had in the past.  ? Lasts longer than 4 weeks.  · You lose weight for without trying.  Get help right away if:  · You cannot control when you pee (urinate) or poop (have a bowel movement).  · You   have weakness in any of these areas and it gets worse.  ? Lower back.  ? Lower belly (pelvis).  ? Butt (buttocks).  ? Legs.  · You have redness or swelling of your back.  · You have a burning feeling when you pee.  This information is not intended to replace advice given to you by your health care provider. Make sure you discuss any questions you have with your health care provider.  Document Released: 07/20/2008 Document Revised: 03/18/2016 Document Reviewed: 06/20/2015  Elsevier Interactive Patient Education © 2019 [employer]

## 2019-02-13 NOTE — Assessment & Plan Note (Signed)
Ongoing, referral to neurosurgery placed.

## 2019-02-13 NOTE — Assessment & Plan Note (Signed)
Chronic, stable with addition of Duloxetine (which helps mood and pain).  Continue current dose and adjust as needed.  Return in 3 months.

## 2019-02-13 NOTE — Progress Notes (Signed)
Ht 5\' 2"  (1.575 m)   Wt 160 lb (72.6 kg)   LMP  (LMP Unknown)   BMI 29.26 kg/m    Subjective:    Patient ID: Breanna Davidson, female    DOB: December 27, 1950, 68 y.o.   MRN: [id number]  HPI: Breanna Davidson is a 68 y.o. female  Chief Complaint  Patient presents with  . Back Pain    f/u  . Depression    . This visit was completed via WebEx due to the restrictions of the COVID-19 pandemic. All issues as above were discussed and addressed. Physical exam was done as above through visual confirmation on WebEx. If it was felt that the patient should be evaluated in the office, they were directed there. The patient verbally consented to this visit. . Location of the patient: home . Location of the provider: home . Those involved with this call:  . Provider: Marnee Guarneri, DNP . CMA: Lesle Chris, [place] . Front Desk/Registration: Linard Millers  . Time spent on call: 15 minutes with patient face to face via video conference. More than 50% of this time was spent in counseling and coordination of care. 10 minutes total spent in review of patient's record and preparation of their chart.  BACK PAIN Currently being followed by [facility].  Back pain due to fall in 2017 and has been present since that time, also has history of fracture of coccyx 47 years ago after giving birth.  Is receiving epidural injections at ortho office.  Has tried Gabapentin, but this made her sleepy.  At baseline Breanna Davidson is very active and travels frequently with her husband, so this is affecting her ADLs.  Last visit with physical therapy was 10/27/2018.  On MRI 09/01/18 she was noted to have moderately severe central canal stenosis of L4/L5 and mild to moderate narrowing of subarticular recesses reater on left + mild narrowing L3-4.  She is interested in surgery at this point, which ortho group had discussed with her.  Agrees to referral to neurosurgery for further guidance.  Is aware surgery may not be  performed until after COVID 19 restrictions lifted. Location: Left Onset: gradual Severity: mild Quality: sharp, shooting and stabbing Frequency: intermittent Radiation: L leg below the knee Aggravating factors: movement Alleviating factors: ice, laying and NSAIDs Status: fluctuating Treatments attempted: ice, ibuprofen, aleve and physical therapy  Relief with NSAIDs?: moderate Nighttime pain:  no Paresthesias / decreased sensation:  no Bowel / bladder incontinence:  no Fevers:  no Dysuria / urinary frequency:  no   DEPRESSION Started on Duloxetine at last visit, which she reports has improved mood and offered some help with pain.  Reports wishes to stay at current dose. Mood status: controlled Satisfied with current treatment?: yes Symptom severity: mild  Duration of current treatment : chronic Side effects: no Medication compliance: good compliance Psychotherapy/counseling: none Depressed mood: no Anxious mood: no Anhedonia: no Significant weight loss or gain: no Insomnia: none Fatigue: no Feelings of worthlessness or guilt: no Impaired concentration/indecisiveness: no Suicidal ideations: no Hopelessness: no Crying spells: no Depression screen [facility] 2/9 02/13/2019 12/13/2018 06/23/2018 06/23/2017 07/23/2016  Decreased Interest 0 2 0 0 0  Down, Depressed, Hopeless 0 2 0 0 0  PHQ - 2 Score 0 4 0 0 0  Altered sleeping 0 1 - - 1  Tired, decreased energy 2 0 - - 0  Change in appetite 0 0 - - 0  Feeling bad or failure about yourself  0 0 - -  0  Trouble concentrating 0 0 - - 0  Moving slowly or fidgety/restless 0 0 - - 0  Suicidal thoughts 0 0 - - 0  PHQ-9 Score 2 5 - - 1  Difficult doing work/chores - Somewhat difficult - - -    Relevant past medical, surgical, family and social history reviewed and updated as indicated. Interim medical history since our last visit reviewed. Allergies and medications reviewed and updated.  Review of Systems  Constitutional: Negative for  activity change, appetite change, diaphoresis, fatigue and fever.  Respiratory: Negative for cough, chest tightness and shortness of breath.   Cardiovascular: Negative for chest pain, palpitations and leg swelling.  Gastrointestinal: Negative for abdominal distention, abdominal pain, constipation, diarrhea, nausea and vomiting.  Endocrine: Negative for cold intolerance, heat intolerance, polydipsia, polyphagia and polyuria.  Musculoskeletal: Positive for back pain.  Neurological: Negative for dizziness, syncope, weakness, light-headedness, numbness and headaches.  Psychiatric/Behavioral: Negative.     Per HPI unless specifically indicated above     Objective:    Ht 5\' 2"  (1.575 m)   Wt 160 lb (72.6 kg)   LMP  (LMP Unknown)   BMI 29.26 kg/m   Wt Readings from Last 3 Encounters:  02/13/19 160 lb (72.6 kg)  12/13/18 164 lb (74.4 kg)  08/01/18 162 lb 6 oz (73.7 kg)    Physical Exam Vitals signs and nursing note reviewed.  Constitutional:      General: She is awake.     Appearance: She is well-developed.  HENT:     Head: Normocephalic.     Right Ear: Hearing normal.     Left Ear: Hearing normal.     Nose: Nose normal.     Mouth/Throat:     Mouth: Mucous membranes are moist.  Eyes:     General: Lids are normal.        Right eye: No discharge.        Left eye: No discharge.     Conjunctiva/sclera: Conjunctivae normal.  Neck:     Musculoskeletal: Normal range of motion.     Thyroid: No thyromegaly.     Vascular: No JVD.  Cardiovascular:     Comments: Unable to auscultate due to virtual visit only Pulmonary:     Effort: Pulmonary effort is normal. No accessory muscle usage or respiratory distress.     Comments: Unable to auscultate due to virtual visit only Musculoskeletal:     Lumbar back: She exhibits decreased range of motion and pain. She exhibits no tenderness and no spasm.     Comments: Per virtual visit performed with patient assistance.  Decreased ROM with  flexion/extension.  Discomfort with rotation to left side.  No rashes noted.    Neurological:     Mental Status: She is alert and oriented to person, place, and time.  Psychiatric:        Attention and Perception: Attention normal.        Mood and Affect: Mood normal.        Behavior: Behavior normal. Behavior is cooperative.        Thought Content: Thought content normal.        Judgment: Judgment normal.     Results for orders placed or performed in visit on 12/13/18  TSH  Result Value Ref Range   TSH 1.740 0.450 - 4.500 uIU/mL      Assessment & Plan:   Problem List Items Addressed This Visit      Nervous and Auditory   Chronic  left-sided low back pain with left-sided sciatica    Chronic, ongoing.  Continue Duloxetine and referral placed to neurosurgery for further discussion of surgical options.      Relevant Orders   Ambulatory referral to Neurosurgery     Other   Depression - Primary    Chronic, stable with addition of Duloxetine (which helps mood and pain).  Continue current dose and adjust as needed.  Return in 3 months.      Spinal stenosis of lumbar region with neurogenic claudication    Ongoing, referral to neurosurgery placed.      Relevant Orders   Ambulatory referral to Neurosurgery      I discussed the assessment and treatment plan with the patient. The patient was provided an opportunity to ask questions and all were answered. The patient agreed with the plan and demonstrated an understanding of the instructions.   The patient was advised to call back or seek an in-person evaluation if the symptoms worsen or if the condition fails to improve as anticipated.   I provided 15 minutes of time during this encounter.  Follow up plan: Return in about 3 months (around 05/15/2019) for Back pain, HTN, Depression.

## 2019-02-15 DIAGNOSIS — M25552 Pain in left hip: Secondary | ICD-10-CM | POA: Diagnosis not present

## 2019-02-15 DIAGNOSIS — M48061 Spinal stenosis, lumbar region without neurogenic claudication: Secondary | ICD-10-CM | POA: Diagnosis not present

## 2019-02-15 DIAGNOSIS — M25562 Pain in left knee: Secondary | ICD-10-CM | POA: Diagnosis not present

## 2019-02-20 DIAGNOSIS — M25562 Pain in left knee: Secondary | ICD-10-CM | POA: Diagnosis not present

## 2019-02-22 DIAGNOSIS — M25561 Pain in right knee: Secondary | ICD-10-CM | POA: Diagnosis not present

## 2019-02-22 DIAGNOSIS — M25562 Pain in left knee: Secondary | ICD-10-CM | POA: Diagnosis not present

## 2019-02-27 DIAGNOSIS — M7632 Iliotibial band syndrome, left leg: Secondary | ICD-10-CM | POA: Diagnosis not present

## 2019-02-27 DIAGNOSIS — M6281 Muscle weakness (generalized): Secondary | ICD-10-CM | POA: Diagnosis not present

## 2019-02-27 DIAGNOSIS — M25562 Pain in left knee: Secondary | ICD-10-CM | POA: Diagnosis not present

## 2019-03-06 DIAGNOSIS — M25562 Pain in left knee: Secondary | ICD-10-CM | POA: Diagnosis not present

## 2019-03-06 DIAGNOSIS — M6281 Muscle weakness (generalized): Secondary | ICD-10-CM | POA: Diagnosis not present

## 2019-03-06 DIAGNOSIS — M7632 Iliotibial band syndrome, left leg: Secondary | ICD-10-CM | POA: Diagnosis not present

## 2019-03-13 DIAGNOSIS — M7632 Iliotibial band syndrome, left leg: Secondary | ICD-10-CM | POA: Diagnosis not present

## 2019-03-13 DIAGNOSIS — M25562 Pain in left knee: Secondary | ICD-10-CM | POA: Diagnosis not present

## 2019-03-13 DIAGNOSIS — M6281 Muscle weakness (generalized): Secondary | ICD-10-CM | POA: Diagnosis not present

## 2019-03-21 DIAGNOSIS — M25562 Pain in left knee: Secondary | ICD-10-CM | POA: Diagnosis not present

## 2019-03-21 DIAGNOSIS — M7632 Iliotibial band syndrome, left leg: Secondary | ICD-10-CM | POA: Diagnosis not present

## 2019-03-21 DIAGNOSIS — M6281 Muscle weakness (generalized): Secondary | ICD-10-CM | POA: Diagnosis not present

## 2019-03-22 DIAGNOSIS — M25562 Pain in left knee: Secondary | ICD-10-CM | POA: Diagnosis not present

## 2019-03-27 DIAGNOSIS — M6281 Muscle weakness (generalized): Secondary | ICD-10-CM | POA: Diagnosis not present

## 2019-03-27 DIAGNOSIS — M7632 Iliotibial band syndrome, left leg: Secondary | ICD-10-CM | POA: Diagnosis not present

## 2019-03-27 DIAGNOSIS — M25562 Pain in left knee: Secondary | ICD-10-CM | POA: Diagnosis not present

## 2019-04-03 DIAGNOSIS — R6889 Other general symptoms and signs: Secondary | ICD-10-CM | POA: Diagnosis not present

## 2019-04-04 DIAGNOSIS — M6281 Muscle weakness (generalized): Secondary | ICD-10-CM | POA: Diagnosis not present

## 2019-04-04 DIAGNOSIS — M7632 Iliotibial band syndrome, left leg: Secondary | ICD-10-CM | POA: Diagnosis not present

## 2019-04-04 DIAGNOSIS — M25562 Pain in left knee: Secondary | ICD-10-CM | POA: Diagnosis not present

## 2019-04-10 ENCOUNTER — Telehealth: Payer: Self-pay | Admitting: Nurse Practitioner

## 2019-04-10 NOTE — Telephone Encounter (Signed)
Pt following up on refill request.  Pt has appt 7/23, but has only 3 pills left.  She states this is working, and she cannot miss this medication.  DULoxetine (CYMBALTA) 30 MG capsule  [facility] - [place], [place] - [place] [phone] (Phone) [phone] (Fax)

## 2019-04-10 NOTE — Telephone Encounter (Signed)
Sent refill for mood/pain

## 2019-04-10 NOTE — Telephone Encounter (Signed)
Please advise 

## 2019-04-11 DIAGNOSIS — M6281 Muscle weakness (generalized): Secondary | ICD-10-CM | POA: Diagnosis not present

## 2019-04-11 DIAGNOSIS — M25562 Pain in left knee: Secondary | ICD-10-CM | POA: Diagnosis not present

## 2019-04-11 DIAGNOSIS — M7632 Iliotibial band syndrome, left leg: Secondary | ICD-10-CM | POA: Diagnosis not present

## 2019-04-18 DIAGNOSIS — M25562 Pain in left knee: Secondary | ICD-10-CM | POA: Diagnosis not present

## 2019-04-18 DIAGNOSIS — M7632 Iliotibial band syndrome, left leg: Secondary | ICD-10-CM | POA: Diagnosis not present

## 2019-04-18 DIAGNOSIS — M6281 Muscle weakness (generalized): Secondary | ICD-10-CM | POA: Diagnosis not present

## 2019-04-19 DIAGNOSIS — M7632 Iliotibial band syndrome, left leg: Secondary | ICD-10-CM | POA: Diagnosis not present

## 2019-04-19 DIAGNOSIS — M1612 Unilateral primary osteoarthritis, left hip: Secondary | ICD-10-CM | POA: Diagnosis not present

## 2019-04-19 DIAGNOSIS — M545 Low back pain: Secondary | ICD-10-CM | POA: Diagnosis not present

## 2019-04-25 DIAGNOSIS — M7632 Iliotibial band syndrome, left leg: Secondary | ICD-10-CM | POA: Diagnosis not present

## 2019-04-25 DIAGNOSIS — M6281 Muscle weakness (generalized): Secondary | ICD-10-CM | POA: Diagnosis not present

## 2019-04-25 DIAGNOSIS — M25562 Pain in left knee: Secondary | ICD-10-CM | POA: Diagnosis not present

## 2019-05-02 DIAGNOSIS — M25562 Pain in left knee: Secondary | ICD-10-CM | POA: Diagnosis not present

## 2019-05-02 DIAGNOSIS — M6281 Muscle weakness (generalized): Secondary | ICD-10-CM | POA: Diagnosis not present

## 2019-05-02 DIAGNOSIS — M7632 Iliotibial band syndrome, left leg: Secondary | ICD-10-CM | POA: Diagnosis not present

## 2019-05-09 DIAGNOSIS — M6281 Muscle weakness (generalized): Secondary | ICD-10-CM | POA: Diagnosis not present

## 2019-05-09 DIAGNOSIS — M7632 Iliotibial band syndrome, left leg: Secondary | ICD-10-CM | POA: Diagnosis not present

## 2019-05-09 DIAGNOSIS — M25562 Pain in left knee: Secondary | ICD-10-CM | POA: Diagnosis not present

## 2019-05-17 ENCOUNTER — Ambulatory Visit (INDEPENDENT_AMBULATORY_CARE_PROVIDER_SITE_OTHER): Payer: Medicare Other | Admitting: Nurse Practitioner

## 2019-05-17 ENCOUNTER — Encounter: Payer: Self-pay | Admitting: Nurse Practitioner

## 2019-05-17 ENCOUNTER — Other Ambulatory Visit: Payer: Self-pay

## 2019-05-17 VITALS — BP 125/56 | HR 82

## 2019-05-17 DIAGNOSIS — M5136 Other intervertebral disc degeneration, lumbar region: Secondary | ICD-10-CM

## 2019-05-17 DIAGNOSIS — E7800 Pure hypercholesterolemia, unspecified: Secondary | ICD-10-CM

## 2019-05-17 DIAGNOSIS — F330 Major depressive disorder, recurrent, mild: Secondary | ICD-10-CM

## 2019-05-17 DIAGNOSIS — M48062 Spinal stenosis, lumbar region with neurogenic claudication: Secondary | ICD-10-CM

## 2019-05-17 NOTE — Assessment & Plan Note (Signed)
Ongoing discomfort, will continue Meloxicam as ordered by ortho and have recommended trial of Duloxetine to 60 MG daily.  She is going to try this and notify provider of outcomes.  Does not want to take opioid or "stronger" medications.  Followed by ortho at this time and will review plan of care after August visit.

## 2019-05-17 NOTE — Assessment & Plan Note (Signed)
Check lipid panel outpatient.

## 2019-05-17 NOTE — Patient Instructions (Signed)

## 2019-05-17 NOTE — Progress Notes (Signed)
BP (!) 125/56   Pulse 82   LMP  (LMP Unknown)    Subjective:    Patient ID: Breanna Davidson, female    DOB: 05-28-1951, 68 y.o.   MRN: [id number]  HPI: Breanna Davidson is a 68 y.o. female  Chief Complaint  Patient presents with  . Hyperlipidemia  . Depression  . Back Pain    Pain down left hip, knee, leg.     . This visit was completed via Doximity due to the restrictions of the COVID-19 pandemic. All issues as above were discussed and addressed. Physical exam was done as above through visual confirmation on Doximity. If it was felt that the patient should be evaluated in the office, they were directed there. The patient verbally consented to this visit. . Location of the patient: home . Location of the provider: home . Those involved with this call:  . Provider: Marnee Guarneri, DNP . CMA: Gerda Diss, CMA . Front Desk/Registration: Jill Side  . Time spent on call: 15 minutes with patient face to face via video conference. More than 50% of this time was spent in counseling and coordination of care. 10 minutes total spent in review of patient's record and preparation of their chart.  . I verified patient identity using two factors (patient name and date of birth). Patient consents verbally to being seen via telemedicine visit today.    HYPERLIPIDEMIA Does not wish to take statin, continues on red yeast rice and fish oil. Hyperlipidemia status: good compliance Satisfied with current treatment?  yes Supplements: fish oil and red yeast rice Aspirin:  no The 10-year ASCVD risk score [name] DC Jr., et al., 2013) is: 7.3%   Values used to calculate the score:     Age: 8 years     Sex: Female     Is Non-Hispanic African American: No     Diabetic: No     Tobacco smoker: No     Systolic Blood Pressure: 979 mmHg     Is BP treated: No     HDL Cholesterol: 85 mg/dL     Total Cholesterol: 274 mg/dL Chest pain:  no Coronary artery disease:  no Family history CAD:  no  Family history early CAD:  no  DEPRESSION Started on Duloxetine in February, which she reports has improved mood and offered some help with pain.  Reports wishes to stay at current dose. Mood status: stable Satisfied with current treatment?: yes Symptom severity: mild  Duration of current treatment : chronic Side effects: no Medication compliance: good compliance Psychotherapy/counseling: none Depressed mood: at times due to chronic pain Anxious mood: no Anhedonia: no Significant weight loss or gain: no Insomnia: yes hard to stay asleep Fatigue: yes Feelings of worthlessness or guilt: no Impaired concentration/indecisiveness: yes Suicidal ideations: no Hopelessness: no Crying spells: no Depression screen [facility] 2/9 05/17/2019 02/13/2019 12/13/2018 06/23/2018 06/23/2017  Decreased Interest 0 0 2 0 0  Down, Depressed, Hopeless 0 0 2 0 0  PHQ - 2 Score 0 0 4 0 0  Altered sleeping 0 0 1 - -  Tired, decreased energy 0 2 0 - -  Change in appetite 0 0 0 - -  Feeling bad or failure about yourself  0 0 0 - -  Trouble concentrating 0 0 0 - -  Moving slowly or fidgety/restless - 0 0 - -  Suicidal thoughts 0 0 0 - -  PHQ-9 Score 0 2 5 - -  Difficult doing work/chores - -  Somewhat difficult - -    BACK PAIN Back pain due to fall in 2017 and has been present since that time, also has history of fracture of coccyx 47 years ago after giving birth.  Is receiving epidural injections at ortho office.  Has tried Gabapentin, but this made her sleepy.  At baseline [name] is very active and travels frequently with her husband, so this is affecting her ADLs.  Last visit with physical therapy last week and they said "no more because it is not helping".  On MRI 09/01/18 she was noted to have moderately severe central canal stenosis of L4/L5 and mild to moderate narrowing of subarticular recesses reater on left + mild narrowing L3-4. Neurosurgery referral was placed at last visit, 02/13/2019, and she reports  they said they "do not think it is my back, they think it is my hip or knee", saw them 02/15/19.  She is going to see ortho 05/28/2019 (Dr. 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  Had injection to knee last month, 04/19/2019.   Duration: chronic Mechanism of injury: as noted above Location: Left and low back Onset: gradual Severity: 8/10 Quality: dull, aching and throbbing Frequency: intermittent Radiation: L leg above the knee Aggravating factors: movement, walking and prolonged sitting Alleviating factors: Meloxicam and Duloxetine + heat & ice + rest Status: fluctuating Treatments attempted: Meloxicam, Gabapentin, Duloxetine, rest, ice, heat and physical therapy  Relief with NSAIDs?: moderate Nighttime pain:  at times when moving Paresthesias / decreased sensation:  no Bowel / bladder incontinence:  no Fevers:  no Dysuria / urinary frequency:  no  Relevant past medical, surgical, family and social history reviewed and updated as indicated. Interim medical history since our last visit reviewed. Allergies and medications reviewed and updated.  Review of Systems  Constitutional: Negative for activity change, appetite change, diaphoresis, fatigue and fever.  Respiratory: Negative for cough, chest tightness and shortness of breath.   Cardiovascular: Negative for chest pain, palpitations and leg swelling.  Gastrointestinal: Negative for abdominal distention, abdominal pain, constipation, diarrhea, nausea and vomiting.  Musculoskeletal: Positive for arthralgias.  Neurological: Negative for dizziness, syncope, weakness, light-headedness, numbness and headaches.  Psychiatric/Behavioral: Positive for decreased concentration and sleep disturbance. Negative for self-injury and suicidal ideas.    Per HPI unless specifically indicated above     Objective:    BP (!) 125/56   Pulse 82   LMP  (LMP Unknown)   Wt Readings from Last 3 Encounters:  02/13/19 160 lb (72.6 kg)   12/13/18 164 lb (74.4 kg)  08/01/18 162 lb 6 oz (73.7 kg)    Physical Exam Vitals signs and nursing note reviewed.  Constitutional:      General: She is awake. She is not in acute distress.    Appearance: She is well-developed. She is not ill-appearing.  HENT:     Head: Normocephalic.     Right Ear: Hearing normal.     Left Ear: Hearing normal.  Eyes:     General: Lids are normal.        Right eye: No discharge.        Left eye: No discharge.     Conjunctiva/sclera: Conjunctivae normal.  Neck:     Musculoskeletal: Normal range of motion.  Cardiovascular:     Comments: Unable to auscultate due to virtual exam only  Pulmonary:     Effort: Pulmonary effort is normal. No accessory muscle usage or respiratory distress.     Comments: Unable to auscultate  due to virtual exam only  Neurological:     Mental Status: She is alert and oriented to person, place, and time.  Psychiatric:        Attention and Perception: Attention normal.        Mood and Affect: Mood normal.        Behavior: Behavior normal. Behavior is cooperative.        Thought Content: Thought content normal.        Judgment: Judgment normal.     Results for orders placed or performed in visit on 12/13/18  TSH  Result Value Ref Range   TSH 1.740 0.450 - 4.500 uIU/mL      Assessment & Plan:   Problem List Items Addressed This Visit      Musculoskeletal and Integument   DDD (degenerative disc disease), lumbar   Relevant Medications   meloxicam (MOBIC) 7.5 MG tablet   Other Relevant Orders   VITAMIN D 25 Hydroxy (Vit-D Deficiency, Fractures)     Other   Hypercholesteremia    Check lipid panel outpatient.      Relevant Orders   Comprehensive metabolic panel   Lipid Panel w/o Chol/HDL Ratio   Depression - Primary    Chronic, ongoing with chronic pain.  Trial increasing Duloxetine to 60 MG and patient to update provider on outcome.  Return in 4 weeks for follow-up.  Will obtain outpatient labs.       Relevant Orders   CBC with Differential/Platelet   TSH   Spinal stenosis of lumbar region with neurogenic claudication    Ongoing discomfort, will continue Meloxicam as ordered by ortho and have recommended trial of Duloxetine to 60 MG daily.  She is going to try this and notify provider of outcomes.  Does not want to take opioid or "stronger" medications.  Followed by ortho at this time and will review plan of care after August visit.      Relevant Medications   meloxicam (MOBIC) 7.5 MG tablet      I discussed the assessment and treatment plan with the patient. The patient was provided an opportunity to ask questions and all were answered. The patient agreed with the plan and demonstrated an understanding of the instructions.   The patient was advised to call back or seek an in-person evaluation if the symptoms worsen or if the condition fails to improve as anticipated.   I provided 15 minutes of time during this encounter.  Follow up plan: Return in about 4 weeks (around 06/14/2019) for Depression and chronic pain.

## 2019-05-17 NOTE — Assessment & Plan Note (Signed)
Chronic, ongoing with chronic pain.  Trial increasing Duloxetine to 60 MG and patient to update provider on outcome.  Return in 4 weeks for follow-up.  Will obtain outpatient labs.

## 2019-05-23 ENCOUNTER — Other Ambulatory Visit: Payer: Self-pay

## 2019-05-23 ENCOUNTER — Other Ambulatory Visit: Payer: Medicare Other

## 2019-05-23 DIAGNOSIS — E7800 Pure hypercholesterolemia, unspecified: Secondary | ICD-10-CM | POA: Diagnosis not present

## 2019-05-23 DIAGNOSIS — F330 Major depressive disorder, recurrent, mild: Secondary | ICD-10-CM

## 2019-05-23 DIAGNOSIS — M5136 Other intervertebral disc degeneration, lumbar region: Secondary | ICD-10-CM

## 2019-05-24 LAB — CBC WITH DIFFERENTIAL/PLATELET
Basophils Absolute: 0.1 10*3/uL (ref 0.0–0.2)
Basos: 1 %
EOS (ABSOLUTE): 0.4 10*3/uL (ref 0.0–0.4)
Eos: 7 %
Hematocrit: 38.7 % (ref 34.0–46.6)
Hemoglobin: 13.1 g/dL (ref 11.1–15.9)
Immature Grans (Abs): 0 10*3/uL (ref 0.0–0.1)
Immature Granulocytes: 0 %
Lymphocytes Absolute: 1.4 10*3/uL (ref 0.7–3.1)
Lymphs: 26 %
MCH: 30.3 pg (ref 26.6–33.0)
MCHC: 33.9 g/dL (ref 31.5–35.7)
MCV: 90 fL (ref 79–97)
Monocytes Absolute: 0.5 10*3/uL (ref 0.1–0.9)
Monocytes: 9 %
Neutrophils Absolute: 3.1 10*3/uL (ref 1.4–7.0)
Neutrophils: 57 %
Platelets: 337 10*3/uL (ref 150–450)
RBC: 4.32 x10E6/uL (ref 3.77–5.28)
RDW: 14 % (ref 11.7–15.4)
WBC: 5.6 10*3/uL (ref 3.4–10.8)

## 2019-05-24 LAB — COMPREHENSIVE METABOLIC PANEL
ALT: 15 IU/L (ref 0–32)
AST: 12 IU/L (ref 0–40)
Albumin/Globulin Ratio: 2.3 — ABNORMAL HIGH (ref 1.2–2.2)
Albumin: 4.3 g/dL (ref 3.8–4.8)
Alkaline Phosphatase: 57 IU/L (ref 39–117)
BUN/Creatinine Ratio: 29 — ABNORMAL HIGH (ref 12–28)
BUN: 20 mg/dL (ref 8–27)
Bilirubin Total: 0.4 mg/dL (ref 0.0–1.2)
CO2: 21 mmol/L (ref 20–29)
Calcium: 9.3 mg/dL (ref 8.7–10.3)
Chloride: 103 mmol/L (ref 96–106)
Creatinine, Ser: 0.68 mg/dL (ref 0.57–1.00)
GFR calc Af Amer: 104 mL/min/{1.73_m2} (ref >59)
GFR calc non Af Amer: 90 mL/min/{1.73_m2} (ref >59)
Globulin, Total: 1.9 g/dL (ref 1.5–4.5)
Glucose: 102 mg/dL — ABNORMAL HIGH (ref 65–99)
Potassium: 4.3 mmol/L (ref 3.5–5.2)
Sodium: 137 mmol/L (ref 134–144)
Total Protein: 6.2 g/dL (ref 6.0–8.5)

## 2019-05-24 LAB — LIPID PANEL W/O CHOL/HDL RATIO
Cholesterol, Total: 276 mg/dL — ABNORMAL HIGH (ref 100–199)
HDL: 77 mg/dL (ref >39)
LDL Calculated: 183 mg/dL — ABNORMAL HIGH (ref 0–99)
Triglycerides: 79 mg/dL (ref 0–149)
VLDL Cholesterol Cal: 16 mg/dL (ref 5–40)

## 2019-05-24 LAB — VITAMIN D 25 HYDROXY (VIT D DEFICIENCY, FRACTURES): Vit D, 25-Hydroxy: 32.4 ng/mL (ref 30.0–100.0)

## 2019-05-24 LAB — TSH: TSH: 2.13 u[IU]/mL (ref 0.450–4.500)

## 2019-05-28 DIAGNOSIS — M25562 Pain in left knee: Secondary | ICD-10-CM | POA: Diagnosis not present

## 2019-05-29 ENCOUNTER — Telehealth: Payer: Self-pay

## 2019-05-29 NOTE — Telephone Encounter (Signed)
appt scheduled

## 2019-05-29 NOTE — Telephone Encounter (Signed)
She will need surgical clearance visit please.

## 2019-05-29 NOTE — Telephone Encounter (Signed)
Jolene, does this patient need to come in for surgical clearance.  Copied from [place] [phone]. Topic: General - Other >> May 28, 2019  3:50 PM Nils Flack wrote: Reason for CRM: pt is going to have hip replacement surgery on 06/19/19  Dr Percell Miller office is sending over surgical clearance Please send back as soon as possible >> May 28, 2019  4:35 PM Stark Klein wrote: Juluis Rainier to be looking out for form

## 2019-05-31 ENCOUNTER — Other Ambulatory Visit: Payer: Self-pay

## 2019-05-31 ENCOUNTER — Encounter: Payer: Self-pay | Admitting: Family Medicine

## 2019-05-31 ENCOUNTER — Ambulatory Visit (INDEPENDENT_AMBULATORY_CARE_PROVIDER_SITE_OTHER): Payer: Medicare Other | Admitting: Family Medicine

## 2019-05-31 VITALS — BP 107/68 | HR 74 | Temp 98.2°F | Ht 61.3 in | Wt 164.5 lb

## 2019-05-31 DIAGNOSIS — R7309 Other abnormal glucose: Secondary | ICD-10-CM | POA: Diagnosis not present

## 2019-05-31 DIAGNOSIS — Z01818 Encounter for other preprocedural examination: Secondary | ICD-10-CM | POA: Diagnosis not present

## 2019-05-31 DIAGNOSIS — M1612 Unilateral primary osteoarthritis, left hip: Secondary | ICD-10-CM | POA: Diagnosis present

## 2019-05-31 DIAGNOSIS — Z96642 Presence of left artificial hip joint: Secondary | ICD-10-CM | POA: Diagnosis present

## 2019-05-31 NOTE — Progress Notes (Signed)
BP 107/68   Pulse 74   Temp 98.2 F (36.8 C)   Ht 5' 1.3" (1.557 m)   Wt 164 lb 8 oz (74.6 kg)   LMP  (LMP Unknown)   SpO2 98%   BMI 30.78 kg/m    Subjective:    Patient ID: Breanna Davidson, female    DOB: 03-06-1951, 68 y.o.   MRN: [id number]  HPI: Breanna Davidson is a 68 y.o. female  Chief Complaint  Patient presents with  . surgical clearance   Patient presenting today for surgical clearance for orthopedic surgery scheduled 06/19/19. Feeling well and in usual state of health. Had a regular check up last month with basic labs which were all under stable control for surgical purposes. Was already instructed by surgeon to stop all medicines aside from cymbalta 2 weeks prior to surgery, including meloxicam. Denies CP, SOB, HAs, dizziness.   Relevant past medical, surgical, family and social history reviewed and updated as indicated. Interim medical history since our last visit reviewed. Allergies and medications reviewed and updated.  Review of Systems  Per HPI unless specifically indicated above     Objective:    BP 107/68   Pulse 74   Temp 98.2 F (36.8 C)   Ht 5' 1.3" (1.557 m)   Wt 164 lb 8 oz (74.6 kg)   LMP  (LMP Unknown)   SpO2 98%   BMI 30.78 kg/m   Wt Readings from Last 3 Encounters:  05/31/19 164 lb 8 oz (74.6 kg)  02/13/19 160 lb (72.6 kg)  12/13/18 164 lb (74.4 kg)    Physical Exam Vitals signs and nursing note reviewed.  Constitutional:      Appearance: Normal appearance. She is not ill-appearing.  HENT:     Head: Atraumatic.  Eyes:     Extraocular Movements: Extraocular movements intact.     Conjunctiva/sclera: Conjunctivae normal.  Neck:     Musculoskeletal: Normal range of motion and neck supple.  Cardiovascular:     Rate and Rhythm: Normal rate and regular rhythm.     Heart sounds: Normal heart sounds.  Pulmonary:     Effort: Pulmonary effort is normal.     Breath sounds: Normal breath sounds.  Musculoskeletal: Normal range of  motion.  Skin:    General: Skin is warm and dry.  Neurological:     Mental Status: She is alert and oriented to person, place, and time.  Psychiatric:        Mood and Affect: Mood normal.        Thought Content: Thought content normal.        Judgment: Judgment normal.     Results for orders placed or performed in visit on 05/23/19  Lipid Panel w/o Chol/HDL Ratio  Result Value Ref Range   Cholesterol, Total 276 (H) 100 - 199 mg/dL   Triglycerides 79 0 - 149 mg/dL   HDL 77 >39 mg/dL   VLDL Cholesterol Cal 16 5 - 40 mg/dL   LDL Calculated 183 (H) 0 - 99 mg/dL  VITAMIN D 25 Hydroxy (Vit-D Deficiency, Fractures)  Result Value Ref Range   Vit D, 25-Hydroxy 32.4 30.0 - 100.0 ng/mL  TSH  Result Value Ref Range   TSH 2.130 0.450 - 4.500 uIU/mL  Comprehensive metabolic panel  Result Value Ref Range   Glucose 102 (H) 65 - 99 mg/dL   BUN 20 8 - 27 mg/dL   Creatinine, Ser 0.68 0.57 - 1.00 mg/dL   GFR calc  non Af Amer 90 >59 mL/min/1.73   GFR calc Af Amer 104 >59 mL/min/1.73   BUN/Creatinine Ratio 29 (H) 12 - 28   Sodium 137 134 - 144 mmol/L   Potassium 4.3 3.5 - 5.2 mmol/L   Chloride 103 96 - 106 mmol/L   CO2 21 20 - 29 mmol/L   Calcium 9.3 8.7 - 10.3 mg/dL   Total Protein 6.2 6.0 - 8.5 g/dL   Albumin 4.3 3.8 - 4.8 g/dL   Globulin, Total 1.9 1.5 - 4.5 g/dL   Albumin/Globulin Ratio 2.3 (H) 1.2 - 2.2   Bilirubin Total 0.4 0.0 - 1.2 mg/dL   Alkaline Phosphatase 57 39 - 117 IU/L   AST 12 0 - 40 IU/L   ALT 15 0 - 32 IU/L  CBC with Differential/Platelet  Result Value Ref Range   WBC 5.6 3.4 - 10.8 x10E3/uL   RBC 4.32 3.77 - 5.28 x10E6/uL   Hemoglobin 13.1 11.1 - 15.9 g/dL   Hematocrit 38.7 34.0 - 46.6 %   MCV 90 79 - 97 fL   MCH 30.3 26.6 - 33.0 pg   MCHC 33.9 31.5 - 35.7 g/dL   RDW 14.0 11.7 - 15.4 %   Platelets 337 150 - 450 x10E3/uL   Neutrophils 57 Not Estab. %   Lymphs 26 Not Estab. %   Monocytes 9 Not Estab. %   Eos 7 Not Estab. %   Basos 1 Not Estab. %    Neutrophils Absolute 3.1 1.4 - 7.0 x10E3/uL   Lymphocytes Absolute 1.4 0.7 - 3.1 x10E3/uL   Monocytes Absolute 0.5 0.1 - 0.9 x10E3/uL   EOS (ABSOLUTE) 0.4 0.0 - 0.4 x10E3/uL   Basophils Absolute 0.1 0.0 - 0.2 x10E3/uL   Immature Granulocytes 0 Not Estab. %   Immature Grans (Abs) 0.0 0.0 - 0.1 x10E3/uL      Assessment & Plan:   Problem List Items Addressed This Visit    None    Visit Diagnoses    Pre-op testing    -  Primary   Reviewed recent labs which were stable, added hg a1c per surgeon request. EKG NSR with no acute ST or T wave changes.    Relevant Orders   EKG 12-Lead (Completed)   HgB A1c    Optimized for surgery pending normal Hg A1C.    Follow up plan: Return for as scheduled.

## 2019-05-31 NOTE — H&P (Signed)
HIP ARTHROPLASTY ADMISSION H&P  Patient ID: Breanna Davidson MRN: [id number] DOB/AGE: 68/26/1952 68 y.o.  Chief Complaint: left hip pain.  Planned Procedure Date: 06/19/19 Medical Clearance by Marnee Guarneri, NP     HPI: Breanna Davidson is a 68 y.o. female with a history of HLD and depression who presents for evaluation of OA LEFT HIP. The patient has a history of pain and functional disability in the left hip due to arthritis and has failed non-surgical conservative treatments for greater than 12 weeks to include NSAID's and/or analgesics, corticosteriod injections and activity modification.  Onset of symptoms was gradual, starting 2 + years ago with gradually worsening course since that time.  Patient currently rates pain at 8 out of 10 with activity. Patient has night pain, worsening of pain with activity and weight bearing and pain that interferes with activities of daily living.  Patient has evidence of periarticular osteophytes and joint space narrowing by imaging studies.  There is no active infection.  Past Medical History:  Diagnosis Date  . Allergy   . Hyperlipidemia   . Osteopenia    Past Surgical History:  Procedure Laterality Date  . APPENDECTOMY    . CESAREAN SECTION    . DILATION AND CURETTAGE OF UTERUS    . REDUCTION MAMMAPLASTY  1988   Allergies  Allergen Reactions  . Prednisone Other (See Comments)    Altered Mental Status   Prior to Admission medications   Medication Sig Start Date End Date Taking? Authorizing Provider  Calcium-Magnesium-Vitamin D (CALCIUM MAGNESIUM PO) Take by mouth daily.    [provider]  cholecalciferol (VITAMIN D) 1000 UNITS tablet Take 2,000 Units by mouth daily.    [provider]  Coenzyme Q10 (CO Q 10) 100 MG CAPS Take 100 mg by mouth daily.    [provider]  COLLAGEN PO Take by mouth daily. Powder    [provider]  DULoxetine (CYMBALTA) 30 MG capsule TAKE 1 CAPSULE EVERY DAY 04/10/19    Cannady, Jolene T, NP  fluticasone (FLONASE) 50 MCG/ACT nasal spray Place 2 sprays into both nostrils daily.    [provider]  meloxicam (MOBIC) 7.5 MG tablet Take 7.5 mg by mouth daily.    [provider]  Omega-3 Fatty Acids (FISH OIL) 1000 MG CAPS Take by mouth daily.    [provider]  Red Yeast Rice Extract (RED YEAST RICE PO) Take by mouth.    [provider]   Social History   Socioeconomic History  . Marital status: Married    Spouse name: Not on file  . Number of children: Not on file  . Years of education: college   . Highest education level: Not on file  Occupational History  . Not on file  Social Needs  . Financial resource strain: Not hard at all  . Food insecurity    Worry: Never true    Inability: Never true  . Transportation needs    Medical: No    Non-medical: No  Tobacco Use  . Smoking status: Never Smoker  . Smokeless tobacco: Never Used  Substance and Sexual Activity  . Alcohol use: Yes    Comment: 1-2 glasses a night   . Drug use: No  . Sexual activity: Yes  Lifestyle  . Physical activity    Days per week: 0 days    Minutes per session: 0 min  . Stress: Not at all  Relationships  . Social Herbalist on  phone: More than three times a week    Gets together: More than three times a week    Attends religious service: Never    Active member of club or organization: Yes    Attends meetings of clubs or organizations: More than 4 times per year    Relationship status: Married  Other Topics Concern  . Not on file  Social History Narrative  . Not on file   Family History  Problem Relation Age of Onset  . Breast cancer Mother 74       and again at 106  . Lung disease Mother   . Cancer Mother   . Emphysema Mother   . Breast cancer Maternal Grandmother   . CAD Father   . Hyperlipidemia Sister   . Mental illness Daughter        bipolar  . Cystic fibrosis Daughter   . Breast cancer Maternal Aunt      ROS: Currently denies lightheadedness, dizziness, Fever, chills, CP, SOB.   No personal history of DVT, PE, MI, or CVA. No loose teeth or dentures All other systems have been reviewed and were otherwise currently negative with the exception of those mentioned in the HPI and as above.  Objective: Vitals: Ht: 5'2" Wt: 1666 Temp: 97.2 BP: 135/80 Pulse: 66 O2 99% on room air.   Physical Exam: General: Alert, NAD. Trendelenberg Gait  HEENT: EOMI, Good Neck Extension  Pulm: No increased work of breathing.  Clear B/L A/P w/o crackle or wheeze.  CV: RRR, No m/g/r appreciated  GI: soft, NT, ND Neuro: Neuro without gross focal deficit.  Sensation intact distally Skin: No lesions in the area of chief complaint MSK/Surgical Site: Left Hip pain with passive ROM.  Positive Stinchfield.  5/5 strength.  NVI.  Sensation intact distally.  Imaging Review Plain radiographs demonstrate severe degenerative joint disease of the left hip.   Preoperative templating of the joint replacement has been completed, documented, and submitted to the Operating Room personnel in order to optimize intra-operative equipment management.  Assessment: OA LEFT HIP Principal Problem:   Primary osteoarthritis of left hip Active Problems:   Hypercholesteremia   Depression   Plan: Plan for Procedure(s): TOTAL HIP ARTHROPLASTY ANTERIOR APPROACH  The patient history, physical exam, clinical judgement of the provider and imaging are consistent with end stage degenerative joint disease and total joint arthroplasty is deemed medically necessary. The treatment options including medical management, injection therapy, and arthroplasty were discussed at length. The risks and benefits of Procedure(s): TOTAL HIP ARTHROPLASTY ANTERIOR APPROACH were presented and reviewed.  The risks of nonoperative treatment, versus surgical intervention including but not limited to continued pain, aseptic loosening, stiffness,  dislocation/subluxation, infection, bleeding, nerve injury, blood clots, cardiopulmonary complications, morbidity, mortality, among others were discussed. The patient verbalizes understanding and wishes to proceed with the plan.  Patient is being admitted for surgery, pain control, PT, prophylactic antibiotics, VTE prophylaxis, progressive ambulation, ADL's and discharge planning.   Dental prophylaxis discussed and recommended for 2 years postoperatively.   The patient does meet the criteria for TXA which will be used perioperatively.    ASA 81 mg BID will be used postoperatively for DVT prophylaxis in addition to SCDs, and early ambulation.  Plan for Norco, 100 mg Gabapentin for pain.  Robaxin for spasm.  Continue Mobic for inflammation.  Omeprazole for gastric protection.  Docusate for constipation.  The patient is planning to be discharged home with HHPT in care of her husband.   Patient's anticipated  LOS is less than 2 midnights, meeting these requirements: - Lives within 1 hour of care - Has a competent adult at home to recover with post-op recover - NO history of  - Chronic pain requiring opiods  - Diabetes  - Coronary Artery Disease  - Heart failure  - Heart attack  - Stroke  - DVT/VTE  - Cardiac arrhythmia  - Respiratory Failure/COPD  - Renal failure  - Anemia  - Advanced Liver disease   Prudencio Burly III, PA-C 05/31/2019 1:48 PM

## 2019-06-01 LAB — HEMOGLOBIN A1C
Est. average glucose Bld gHb Est-mCnc: 108 mg/dL
Hgb A1c MFr Bld: 5.4 % (ref 4.8–5.6)

## 2019-06-04 DIAGNOSIS — M1612 Unilateral primary osteoarthritis, left hip: Secondary | ICD-10-CM | POA: Diagnosis not present

## 2019-06-11 NOTE — Patient Instructions (Addendum)
YOU NEED TO HAVE A COVID 19 TEST ON__Friday, august 21st_____ @_______ , THIS TEST MUST BE DONE BEFORE SURGERY, COME  [place], [place] , [address]. ONCE YOUR COVID TEST IS COMPLETED, PLEASE BEGIN THE QUARANTINE INSTRUCTIONS AS OUTLINED IN YOUR HANDOUT.                [place]     Your procedure is scheduled on: 06-19-2019   Report to [facility] Main  Entrance    Report to admitting at 7:45 AM   1 VISITOR IS ALLOWED TO WAIT IN WAITING ROOM  ONLY DAY OF YOUR SURGERY. NO VISITORS ARE ALLOWED IN SHORT STAY OR RECOVERY ROOM.   Call this number if you have problems the morning of surgery [phone]    Remember: [place], NO CHEWING GUM CANDY OR MINTS.   NO SOLID FOOD AFTER MIDNIGHT THE NIGHT PRIOR TO SURGERY. NOTHING BY MOUTH EXCEPT CLEAR LIQUIDS UNTIL  7:15 AM . PLEASE FINISH ENSURE DRINK PER SURGEON ORDER  WHICH NEEDS TO BE COMPLETED AT 7:15 AM .   CLEAR LIQUID DIET   Foods Allowed                                                                     Foods Excluded  Coffee and tea, regular and decaf                             liquids that you cannot  Plain Jell-O any favor except red or purple                                           see through such as: Fruit ices (not with fruit pulp)                                     milk, soups, orange juice  Iced Popsicles                                    All solid food Carbonated beverages, regular and diet                                    Cranberry, grape and apple juices Sports drinks like Gatorade Lightly seasoned clear broth or consume(fat free) Sugar, honey syrup  Sample Menu Breakfast                                Lunch                                     Supper Cranberry juice  Beef broth                            Chicken broth Jell-O                                     Grape juice                           Apple  juice Coffee or tea                        Jell-O                                      Popsicle                                                Coffee or tea                        Coffee or tea  _____________________________________________________________________     Take these medicines the morning of surgery with A SIP OF WATER: DULOXETINE (CYMBALTA)                                 You may not have any metal on your body including hair pins and              piercings  Do not wear jewelry, make-up, lotions, powders or perfumes, deodorant             Do not wear nail polish.  Do not shave  48 hours prior to surgery.               Do not bring valuables to the hospital. [place].  Contacts, dentures or bridgework may not be worn into surgery.  Leave suitcase in the car. After surgery it may be brought to your room.      _____________________________________________________________________             [facility] - Preparing for Surgery Before surgery, you can play an important role.  Because skin is not sterile, your skin needs to be as free of germs as possible.  You can reduce the number of germs on your skin by washing with CHG (chlorahexidine gluconate) soap before surgery.  CHG is an antiseptic cleaner which kills germs and bonds with the skin to continue killing germs even after washing. Please DO NOT use if you have an allergy to CHG or antibacterial soaps.  If your skin becomes reddened/irritated stop using the CHG and inform your nurse when you arrive at Short Stay. Do not shave (including legs and underarms) for at least 48 hours prior to the first CHG shower.  You may shave your face/neck. Please follow these instructions carefully:  1.  Shower with CHG Soap the night before surgery and the  morning of Surgery.  2.  If you  choose to wash your hair, wash your hair first as usual with your  normal  shampoo.  3.  After you  shampoo, rinse your hair and body thoroughly to remove the  shampoo.                           4.  Use CHG as you would any other liquid soap.  You can apply chg directly  to the skin and wash                       Gently with a scrungie or clean washcloth.  5.  Apply the CHG Soap to your body ONLY FROM THE NECK DOWN.   Do not use on face/ open                           Wound or open sores. Avoid contact with eyes, ears mouth and genitals (private parts).                       Wash face,  Genitals (private parts) with your normal soap.             6.  Wash thoroughly, paying special attention to the area where your surgery  will be performed.  7.  Thoroughly rinse your body with warm water from the neck down.  8.  DO NOT shower/wash with your normal soap after using and rinsing off  the CHG Soap.                9.  Pat yourself dry with a clean towel.            10.  Wear clean pajamas.            11.  Place clean sheets on your bed the night of your first shower and do not  sleep with pets. Day of Surgery : Do not apply any lotions/deodorants the morning of surgery.  Please wear clean clothes to the hospital/surgery center.  FAILURE TO FOLLOW THESE INSTRUCTIONS MAY RESULT IN THE CANCELLATION OF YOUR SURGERY PATIENT SIGNATURE_________________________________  NURSE SIGNATURE__________________________________  ________________________________________________________________________   Adam Phenix  An incentive spirometer is a tool that can help keep your lungs clear and active. This tool measures how well you are filling your lungs with each breath. Taking long deep breaths may help reverse or decrease the chance of developing breathing (pulmonary) problems (especially infection) following:  A long period of time when you are unable to move or be active. BEFORE THE PROCEDURE   If the spirometer includes an indicator to show your best effort, your nurse or respiratory therapist  will set it to a desired goal.  If possible, sit up straight or lean slightly forward. Try not to slouch.  Hold the incentive spirometer in an upright position. INSTRUCTIONS FOR USE  1. Sit on the edge of your bed if possible, or sit up as far as you can in bed or on a chair. 2. Hold the incentive spirometer in an upright position. 3. Breathe out normally. 4. Place the mouthpiece in your mouth and seal your lips tightly around it. 5. Breathe in slowly and as deeply as possible, raising the piston or the ball toward the top of the column. 6. Hold your breath for 3-5 seconds or for as long as possible. Allow the piston or ball to fall  to the bottom of the column. 7. Remove the mouthpiece from your mouth and breathe out normally. 8. Rest for a few seconds and repeat Steps 1 through 7 at least 10 times every 1-2 hours when you are awake. Take your time and take a few normal breaths between deep breaths. 9. The spirometer may include an indicator to show your best effort. Use the indicator as a goal to work toward during each repetition. 10. After each set of 10 deep breaths, practice coughing to be sure your lungs are clear. If you have an incision (the cut made at the time of surgery), support your incision when coughing by placing a pillow or rolled up towels firmly against it. Once you are able to get out of bed, walk around indoors and cough well. You may stop using the incentive spirometer when instructed by your caregiver.  RISKS AND COMPLICATIONS  Take your time so you do not get dizzy or light-headed.  If you are in pain, you may need to take or ask for pain medication before doing incentive spirometry. It is harder to take a deep breath if you are having pain. AFTER USE  Rest and breathe slowly and easily.  It can be helpful to keep track of a log of your progress. Your caregiver can provide you with a simple table to help with this. If you are using the spirometer at home, follow  these instructions: [place] IF:   You are having difficultly using the spirometer.  You have trouble using the spirometer as often as instructed.  Your pain medication is not giving enough relief while using the spirometer.  You develop fever of 100.5 F (38.1 C) or higher. SEEK IMMEDIATE MEDICAL CARE IF:   You cough up bloody sputum that had not been present before.  You develop fever of 102 F (38.9 C) or greater.  You develop worsening pain at or near the incision site. MAKE SURE YOU:   Understand these instructions.  Will watch your condition.  Will get help right away if you are not doing well or get worse. Document Released: 02/21/2007 Document Revised: 01/03/2012 Document Reviewed: 04/24/2007 [facility] Patient Information 2014 [place], [place].   ________________________________________________________________________

## 2019-06-11 NOTE — Progress Notes (Signed)
MEDICAL CLEARANCE NOTE RACHEL LANE PA 05-31-19 Epic EKG 05-31-19 Epic  HEMAGLOBIN A1C 05-31-19 EPIC

## 2019-06-12 ENCOUNTER — Encounter (HOSPITAL_COMMUNITY): Payer: Self-pay

## 2019-06-12 ENCOUNTER — Encounter (HOSPITAL_COMMUNITY)
Admission: RE | Admit: 2019-06-12 | Discharge: 2019-06-12 | Disposition: A | Discharge status: Home / Self Care | Payer: Medicare Other | Source: Ambulatory Visit | Attending: Orthopedic Surgery | Admitting: Orthopedic Surgery

## 2019-06-12 ENCOUNTER — Other Ambulatory Visit: Payer: Self-pay

## 2019-06-12 DIAGNOSIS — M1612 Unilateral primary osteoarthritis, left hip: Secondary | ICD-10-CM | POA: Diagnosis not present

## 2019-06-12 DIAGNOSIS — Z01812 Encounter for preprocedural laboratory examination: Secondary | ICD-10-CM | POA: Insufficient documentation

## 2019-06-12 DIAGNOSIS — Z20828 Contact with and (suspected) exposure to other viral communicable diseases: Secondary | ICD-10-CM | POA: Insufficient documentation

## 2019-06-12 HISTORY — DX: Ocular hypertension, unspecified eye: H40.059

## 2019-06-12 HISTORY — DX: Chest pain, unspecified: R07.9

## 2019-06-12 HISTORY — DX: Dizziness and giddiness: R42

## 2019-06-12 HISTORY — DX: Spinal stenosis, site unspecified: M48.00

## 2019-06-12 LAB — CBC
HCT: 41.6 % (ref 36.0–46.0)
Hemoglobin: 13.4 g/dL (ref 12.0–15.0)
MCH: 30 pg (ref 26.0–34.0)
MCHC: 32.2 g/dL (ref 30.0–36.0)
MCV: 93.3 fL (ref 80.0–100.0)
Platelets: 312 10*3/uL (ref 150–400)
RBC: 4.46 MIL/uL (ref 3.87–5.11)
RDW: 13.7 % (ref 11.5–15.5)
WBC: 6.1 10*3/uL (ref 4.0–10.5)
nRBC: 0 % (ref 0.0–0.2)

## 2019-06-12 LAB — URINALYSIS, ROUTINE W REFLEX MICROSCOPIC
Bacteria, UA: NONE SEEN
Bilirubin Urine: NEGATIVE
Glucose, UA: NEGATIVE mg/dL
Hgb urine dipstick: NEGATIVE
Ketones, ur: NEGATIVE mg/dL
Nitrite: NEGATIVE
Protein, ur: NEGATIVE mg/dL
Specific Gravity, Urine: 1.003 — ABNORMAL LOW (ref 1.005–1.030)
pH: 7 (ref 5.0–8.0)

## 2019-06-12 LAB — BASIC METABOLIC PANEL
Anion gap: 6 (ref 5–15)
BUN: 18 mg/dL (ref 8–23)
CO2: 26 mmol/L (ref 22–32)
Calcium: 9.3 mg/dL (ref 8.9–10.3)
Chloride: 105 mmol/L (ref 98–111)
Creatinine, Ser: 0.85 mg/dL (ref 0.44–1.00)
GFR calc Af Amer: >60 mL/min (ref >60)
GFR calc non Af Amer: >60 mL/min (ref >60)
Glucose, Bld: 98 mg/dL (ref 70–99)
Potassium: 4.4 mmol/L (ref 3.5–5.1)
Sodium: 137 mmol/L (ref 135–145)

## 2019-06-12 LAB — SURGICAL PCR SCREEN
MRSA, PCR: NEGATIVE
Staphylococcus aureus: NEGATIVE

## 2019-06-15 ENCOUNTER — Other Ambulatory Visit (HOSPITAL_COMMUNITY)
Admission: RE | Admit: 2019-06-15 | Discharge: 2019-06-15 | Disposition: A | Discharge status: Home / Self Care | Payer: Medicare Other | Source: Ambulatory Visit | Attending: Orthopedic Surgery | Admitting: Orthopedic Surgery

## 2019-06-15 DIAGNOSIS — Z01812 Encounter for preprocedural laboratory examination: Secondary | ICD-10-CM | POA: Diagnosis not present

## 2019-06-15 DIAGNOSIS — M1612 Unilateral primary osteoarthritis, left hip: Secondary | ICD-10-CM | POA: Diagnosis not present

## 2019-06-15 LAB — SARS CORONAVIRUS 2 (TAT 6-24 HRS): SARS Coronavirus 2: NEGATIVE

## 2019-06-18 MED ORDER — BUPIVACAINE LIPOSOME 1.3 % IJ SUSP
10.0000 mL | Freq: Once | INTRAMUSCULAR | Status: DC
  Filled 2019-06-18: qty 10

## 2019-06-18 NOTE — Progress Notes (Signed)
Patient made aware of time change. Will report to Short Stay at 0530. Npo solids after mn. Clears until Pre-op ensure at 0430 then total npo.

## 2019-06-18 NOTE — Anesthesia Preprocedure Evaluation (Addendum)
Anesthesia Evaluation  Patient identified by MRN, date of birth, ID band Patient awake    Reviewed: Allergy & Precautions, NPO status , Patient's Chart, lab work & pertinent test results  History of Anesthesia Complications Negative for: history of anesthetic complications  Airway Mallampati: II  TM Distance: >3 FB Neck ROM: Full    Dental  (+) Teeth Intact   Pulmonary neg pulmonary ROS,    Pulmonary exam normal        Cardiovascular negative cardio ROS Normal cardiovascular exam     Neuro/Psych PSYCHIATRIC DISORDERS Depression negative neurological ROS     GI/Hepatic negative GI ROS, Neg liver ROS,   Endo/Other  negative endocrine ROS  Renal/GU negative Renal ROS  negative genitourinary   Musculoskeletal  (+) Arthritis , Osteoarthritis,    Abdominal   Peds  Hematology negative hematology ROS (+)   Anesthesia Other Findings   Reproductive/Obstetrics                            Anesthesia Physical Anesthesia Plan  ASA: II  Anesthesia Plan: Spinal   Post-op Pain Management:    Induction:   PONV Risk Score and Plan: Propofol infusion, Treatment may vary due to age or medical condition, Ondansetron and TIVA  Airway Management Planned: Nasal Cannula and Simple Face Mask  Additional Equipment: None  Intra-op Plan:   Post-operative Plan:   Informed Consent: I have reviewed the patients History and Physical, chart, labs and discussed the procedure including the risks, benefits and alternatives for the proposed anesthesia with the patient or authorized representative who has indicated his/her understanding and acceptance.       Plan Discussed with:   Anesthesia Plan Comments:        Anesthesia Quick Evaluation

## 2019-06-19 ENCOUNTER — Other Ambulatory Visit: Payer: Self-pay

## 2019-06-19 ENCOUNTER — Ambulatory Visit (HOSPITAL_COMMUNITY): Payer: Medicare Other | Admitting: Anesthesiology

## 2019-06-19 ENCOUNTER — Ambulatory Visit (HOSPITAL_COMMUNITY): Payer: Medicare Other

## 2019-06-19 ENCOUNTER — Encounter (HOSPITAL_COMMUNITY): Payer: Self-pay | Admitting: Emergency Medicine

## 2019-06-19 ENCOUNTER — Observation Stay (HOSPITAL_COMMUNITY)
Admission: RE | Admit: 2019-06-19 | Discharge: 2019-06-20 | Disposition: A | Discharge status: Home / Self Care | Payer: Medicare Other | Source: Ambulatory Visit | Attending: Orthopedic Surgery | Admitting: Orthopedic Surgery

## 2019-06-19 ENCOUNTER — Encounter (HOSPITAL_COMMUNITY)
Admission: RE | Disposition: A | Discharge status: Home / Self Care | Payer: Self-pay | Source: Ambulatory Visit | Attending: Orthopedic Surgery

## 2019-06-19 ENCOUNTER — Ambulatory Visit (HOSPITAL_COMMUNITY): Payer: Medicare Other | Admitting: Physician Assistant

## 2019-06-19 DIAGNOSIS — E782 Mixed hyperlipidemia: Secondary | ICD-10-CM | POA: Diagnosis not present

## 2019-06-19 DIAGNOSIS — E7800 Pure hypercholesterolemia, unspecified: Secondary | ICD-10-CM | POA: Insufficient documentation

## 2019-06-19 DIAGNOSIS — M8580 Other specified disorders of bone density and structure, unspecified site: Secondary | ICD-10-CM | POA: Diagnosis not present

## 2019-06-19 DIAGNOSIS — F329 Major depressive disorder, single episode, unspecified: Secondary | ICD-10-CM | POA: Insufficient documentation

## 2019-06-19 DIAGNOSIS — Z79899 Other long term (current) drug therapy: Secondary | ICD-10-CM | POA: Insufficient documentation

## 2019-06-19 DIAGNOSIS — Z888 Allergy status to other drugs, medicaments and biological substances status: Secondary | ICD-10-CM | POA: Insufficient documentation

## 2019-06-19 DIAGNOSIS — Z791 Long term (current) use of non-steroidal anti-inflammatories (NSAID): Secondary | ICD-10-CM | POA: Diagnosis not present

## 2019-06-19 DIAGNOSIS — Z96642 Presence of left artificial hip joint: Secondary | ICD-10-CM | POA: Diagnosis present

## 2019-06-19 DIAGNOSIS — M1610 Unilateral primary osteoarthritis, unspecified hip: Secondary | ICD-10-CM | POA: Diagnosis present

## 2019-06-19 DIAGNOSIS — Z419 Encounter for procedure for purposes other than remedying health state, unspecified: Secondary | ICD-10-CM

## 2019-06-19 DIAGNOSIS — Z471 Aftercare following joint replacement surgery: Secondary | ICD-10-CM | POA: Diagnosis not present

## 2019-06-19 DIAGNOSIS — F32A Depression, unspecified: Secondary | ICD-10-CM | POA: Diagnosis present

## 2019-06-19 DIAGNOSIS — M1612 Unilateral primary osteoarthritis, left hip: Principal | ICD-10-CM | POA: Insufficient documentation

## 2019-06-19 DIAGNOSIS — M48062 Spinal stenosis, lumbar region with neurogenic claudication: Secondary | ICD-10-CM | POA: Diagnosis not present

## 2019-06-19 HISTORY — PX: TOTAL HIP ARTHROPLASTY: SHX124

## 2019-06-19 SURGERY — ARTHROPLASTY, HIP, TOTAL, ANTERIOR APPROACH
Anesthesia: Spinal | Site: Hip | Laterality: Left

## 2019-06-19 MED ORDER — DULOXETINE HCL 30 MG PO CPEP: 30.0000 mg | ORAL_CAPSULE | Freq: Every day | ORAL | Status: DC

## 2019-06-19 MED ORDER — POLYETHYLENE GLYCOL 3350 17 G PO PACK: 17.0000 g | PACK | Freq: Every day | ORAL | Status: DC | PRN

## 2019-06-19 MED ORDER — MIDAZOLAM HCL 5 MG/5ML IJ SOLN
INTRAMUSCULAR | Status: DC | PRN
  Administered 2019-06-19: 2 mg via INTRAVENOUS

## 2019-06-19 MED ORDER — CHLORHEXIDINE GLUCONATE 4 % EX LIQD: 60.0000 mL | Freq: Once | CUTANEOUS | Status: DC

## 2019-06-19 MED ORDER — DOCUSATE SODIUM 100 MG PO CAPS
100.0000 mg | ORAL_CAPSULE | Freq: Two times a day (BID) | ORAL | Status: DC
  Administered 2019-06-19: 100 mg via ORAL
  Filled 2019-06-19: qty 1

## 2019-06-19 MED ORDER — SODIUM CHLORIDE (PF) 0.9 % IJ SOLN
INTRAMUSCULAR | Status: AC
  Filled 2019-06-19: qty 20

## 2019-06-19 MED ORDER — FENTANYL CITRATE (PF) 100 MCG/2ML IJ SOLN
INTRAMUSCULAR | Status: DC | PRN
  Administered 2019-06-19: 50 ug via INTRAVENOUS

## 2019-06-19 MED ORDER — DIPHENHYDRAMINE HCL 12.5 MG/5ML PO ELIX: 12.5000 mg | ORAL_SOLUTION | ORAL | Status: DC | PRN

## 2019-06-19 MED ORDER — ONDANSETRON HCL 4 MG/2ML IJ SOLN: 4.0000 mg | Freq: Four times a day (QID) | INTRAMUSCULAR | Status: DC | PRN

## 2019-06-19 MED ORDER — SODIUM CHLORIDE FLUSH 0.9 % IV SOLN
INTRAVENOUS | Status: DC | PRN
  Administered 2019-06-19: 20 mL via INTRAVENOUS

## 2019-06-19 MED ORDER — KETOROLAC TROMETHAMINE 15 MG/ML IJ SOLN
7.5000 mg | Freq: Four times a day (QID) | INTRAMUSCULAR | Status: AC
  Administered 2019-06-19 – 2019-06-20 (×4): 7.5 mg via INTRAVENOUS
  Filled 2019-06-19 (×4): qty 1

## 2019-06-19 MED ORDER — BUPIVACAINE LIPOSOME 1.3 % IJ SUSP
INTRAMUSCULAR | Status: DC | PRN
  Administered 2019-06-19: 10 mL

## 2019-06-19 MED ORDER — DOCUSATE SODIUM 100 MG PO CAPS: 100.0000 mg | ORAL_CAPSULE | Freq: Two times a day (BID) | ORAL | 0 refills | Status: DC

## 2019-06-19 MED ORDER — PHENOL 1.4 % MT LIQD: 1.0000 | OROMUCOSAL | Status: DC | PRN

## 2019-06-19 MED ORDER — METHOCARBAMOL 500 MG IVPB - SIMPLE MED
INTRAVENOUS | Status: AC
  Administered 2019-06-19: 10:00:00 500 mg via INTRAVENOUS
  Filled 2019-06-19: qty 50

## 2019-06-19 MED ORDER — WATER FOR IRRIGATION, STERILE IR SOLN
Status: DC | PRN
  Administered 2019-06-19: 2000 mL

## 2019-06-19 MED ORDER — CEFAZOLIN SODIUM-DEXTROSE 1-4 GM/50ML-% IV SOLN
1.0000 g | Freq: Four times a day (QID) | INTRAVENOUS | Status: AC
  Administered 2019-06-19 (×2): 1 g via INTRAVENOUS
  Filled 2019-06-19 (×3): qty 50

## 2019-06-19 MED ORDER — HYDROMORPHONE HCL 1 MG/ML IJ SOLN: 0.5000 mg | INTRAMUSCULAR | Status: DC | PRN

## 2019-06-19 MED ORDER — ONDANSETRON HCL 4 MG/2ML IJ SOLN
INTRAMUSCULAR | Status: AC
  Filled 2019-06-19: qty 2

## 2019-06-19 MED ORDER — ONDANSETRON HCL 4 MG PO TABS: 4.0000 mg | ORAL_TABLET | Freq: Three times a day (TID) | ORAL | 0 refills | Status: DC | PRN

## 2019-06-19 MED ORDER — METOCLOPRAMIDE HCL 5 MG PO TABS: 5.0000 mg | ORAL_TABLET | Freq: Three times a day (TID) | ORAL | Status: DC | PRN

## 2019-06-19 MED ORDER — PHENYLEPHRINE HCL (PRESSORS) 10 MG/ML IV SOLN
INTRAVENOUS | Status: AC
  Filled 2019-06-19: qty 1

## 2019-06-19 MED ORDER — SODIUM CHLORIDE 0.9 % IV SOLN
INTRAVENOUS | Status: DC | PRN
  Administered 2019-06-19: 40 ug/min via INTRAVENOUS

## 2019-06-19 MED ORDER — POVIDONE-IODINE 10 % EX SWAB
2.0000 "application " | Freq: Once | CUTANEOUS | Status: AC
  Administered 2019-06-19: 2 via TOPICAL

## 2019-06-19 MED ORDER — 0.9 % SODIUM CHLORIDE (POUR BTL) OPTIME
TOPICAL | Status: DC | PRN
  Administered 2019-06-19: 1000 mL

## 2019-06-19 MED ORDER — ACETAMINOPHEN 325 MG PO TABS: 325.0000 mg | ORAL_TABLET | Freq: Four times a day (QID) | ORAL | Status: DC | PRN

## 2019-06-19 MED ORDER — MIDAZOLAM HCL 2 MG/2ML IJ SOLN
INTRAMUSCULAR | Status: AC
  Filled 2019-06-19: qty 2

## 2019-06-19 MED ORDER — METHOCARBAMOL 500 MG IVPB - SIMPLE MED
500.0000 mg | Freq: Four times a day (QID) | INTRAVENOUS | Status: DC | PRN
  Administered 2019-06-19: 10:00:00 500 mg via INTRAVENOUS
  Filled 2019-06-19: qty 50

## 2019-06-19 MED ORDER — FENTANYL CITRATE (PF) 100 MCG/2ML IJ SOLN: 25.0000 ug | INTRAMUSCULAR | Status: DC | PRN

## 2019-06-19 MED ORDER — ASPIRIN EC 81 MG PO TBEC: 81.0000 mg | DELAYED_RELEASE_TABLET | Freq: Two times a day (BID) | ORAL | 0 refills | Status: DC

## 2019-06-19 MED ORDER — GABAPENTIN 100 MG PO CAPS: 100.0000 mg | ORAL_CAPSULE | Freq: Two times a day (BID) | ORAL | 0 refills | Status: DC | PRN

## 2019-06-19 MED ORDER — HYDROCODONE-ACETAMINOPHEN 5-325 MG PO TABS: 1.0000 | ORAL_TABLET | Freq: Four times a day (QID) | ORAL | 0 refills | Status: DC | PRN

## 2019-06-19 MED ORDER — FENTANYL CITRATE (PF) 100 MCG/2ML IJ SOLN
INTRAMUSCULAR | Status: AC
  Filled 2019-06-19: qty 2

## 2019-06-19 MED ORDER — CEFAZOLIN SODIUM-DEXTROSE 2-4 GM/100ML-% IV SOLN
2.0000 g | INTRAVENOUS | Status: AC
  Administered 2019-06-19: 2 g via INTRAVENOUS
  Filled 2019-06-19: qty 100

## 2019-06-19 MED ORDER — ACETAMINOPHEN 500 MG PO TABS
1000.0000 mg | ORAL_TABLET | Freq: Once | ORAL | Status: AC
  Administered 2019-06-19: 06:00:00 1000 mg via ORAL
  Filled 2019-06-19: qty 2

## 2019-06-19 MED ORDER — METHOCARBAMOL 500 MG PO TABS
500.0000 mg | ORAL_TABLET | Freq: Four times a day (QID) | ORAL | Status: DC | PRN
  Filled 2019-06-19: qty 1

## 2019-06-19 MED ORDER — SORBITOL 70 % SOLN
30.0000 mL | Freq: Every day | Status: DC | PRN
  Filled 2019-06-19: qty 30

## 2019-06-19 MED ORDER — PROPOFOL 500 MG/50ML IV EMUL
INTRAVENOUS | Status: DC | PRN
  Administered 2019-06-19: 50 ug/kg/min via INTRAVENOUS

## 2019-06-19 MED ORDER — ONDANSETRON HCL 4 MG PO TABS: 4.0000 mg | ORAL_TABLET | Freq: Four times a day (QID) | ORAL | Status: DC | PRN

## 2019-06-19 MED ORDER — BUPIVACAINE IN DEXTROSE 0.75-8.25 % IT SOLN
INTRATHECAL | Status: DC | PRN
  Administered 2019-06-19: 1.7 mL via INTRATHECAL

## 2019-06-19 MED ORDER — MAGNESIUM CITRATE PO SOLN: 1.0000 | Freq: Once | ORAL | Status: DC | PRN

## 2019-06-19 MED ORDER — METHOCARBAMOL 500 MG PO TABS: 500.0000 mg | ORAL_TABLET | Freq: Three times a day (TID) | ORAL | 0 refills | Status: DC | PRN

## 2019-06-19 MED ORDER — OXYCODONE HCL 5 MG PO TABS: 5.0000 mg | ORAL_TABLET | Freq: Once | ORAL | Status: DC | PRN

## 2019-06-19 MED ORDER — ONDANSETRON HCL 4 MG/2ML IJ SOLN
INTRAMUSCULAR | Status: DC | PRN
  Administered 2019-06-19: 4 mg via INTRAVENOUS

## 2019-06-19 MED ORDER — DEXAMETHASONE SODIUM PHOSPHATE 10 MG/ML IJ SOLN
10.0000 mg | Freq: Once | INTRAMUSCULAR | Status: AC
  Administered 2019-06-20: 06:00:00 10 mg via INTRAVENOUS
  Filled 2019-06-19: qty 1

## 2019-06-19 MED ORDER — TRANEXAMIC ACID-NACL 1000-0.7 MG/100ML-% IV SOLN
1000.0000 mg | INTRAVENOUS | Status: AC
  Administered 2019-06-19: 1000 mg via INTRAVENOUS
  Filled 2019-06-19: qty 100

## 2019-06-19 MED ORDER — ONDANSETRON HCL 4 MG/2ML IJ SOLN: 4.0000 mg | Freq: Once | INTRAMUSCULAR | Status: DC | PRN

## 2019-06-19 MED ORDER — METOCLOPRAMIDE HCL 5 MG/ML IJ SOLN: 5.0000 mg | Freq: Three times a day (TID) | INTRAMUSCULAR | Status: DC | PRN

## 2019-06-19 MED ORDER — OMEPRAZOLE 20 MG PO CPDR: 20.0000 mg | DELAYED_RELEASE_CAPSULE | Freq: Every day | ORAL | 0 refills | Status: DC

## 2019-06-19 MED ORDER — LACTATED RINGERS IV SOLN
INTRAVENOUS | Status: DC
  Administered 2019-06-19 (×2): via INTRAVENOUS

## 2019-06-19 MED ORDER — PROPOFOL 10 MG/ML IV BOLUS
INTRAVENOUS | Status: DC | PRN
  Administered 2019-06-19: 20 mg via INTRAVENOUS

## 2019-06-19 MED ORDER — PROPOFOL 10 MG/ML IV BOLUS
INTRAVENOUS | Status: AC
  Filled 2019-06-19: qty 60

## 2019-06-19 MED ORDER — HYDROCODONE-ACETAMINOPHEN 5-325 MG PO TABS
1.0000 | ORAL_TABLET | ORAL | Status: DC | PRN
  Administered 2019-06-19 (×2): 1 via ORAL
  Filled 2019-06-19 (×2): qty 1

## 2019-06-19 MED ORDER — OXYCODONE HCL 5 MG/5ML PO SOLN: 5.0000 mg | Freq: Once | ORAL | Status: DC | PRN

## 2019-06-19 MED ORDER — MENTHOL 3 MG MT LOZG: 1.0000 | LOZENGE | OROMUCOSAL | Status: DC | PRN

## 2019-06-19 MED ORDER — ASPIRIN 81 MG PO CHEW
81.0000 mg | CHEWABLE_TABLET | Freq: Two times a day (BID) | ORAL | Status: DC
  Administered 2019-06-19 – 2019-06-20 (×2): 81 mg via ORAL
  Filled 2019-06-19 (×2): qty 1

## 2019-06-19 MED ORDER — LACTATED RINGERS IV SOLN
INTRAVENOUS | Status: DC
  Administered 2019-06-19 (×2): via INTRAVENOUS

## 2019-06-19 SURGICAL SUPPLY — 40 items
APL PRP STRL LF DISP 70% ISPRP (MISCELLANEOUS) ×1
BLADE SAG 18X100X1.27 (BLADE) IMPLANT
BLADE SURG SZ10 CARB STEEL (BLADE) ×6 IMPLANT
CHLORAPREP W/TINT 26 (MISCELLANEOUS) ×3 IMPLANT
CLOSURE STERI-STRIP 1/2X4 (GAUZE/BANDAGES/DRESSINGS) ×1
CLSR STERI-STRIP ANTIMIC 1/2X4 (GAUZE/BANDAGES/DRESSINGS) ×2 IMPLANT
COVER PERINEAL POST (MISCELLANEOUS) ×3 IMPLANT
COVER SURGICAL LIGHT HANDLE (MISCELLANEOUS) ×5 IMPLANT
COVER WAND RF STERILE (DRAPES) IMPLANT
DECANTER SPIKE VIAL GLASS SM (MISCELLANEOUS) ×6 IMPLANT
DRAPE IMP U-DRAPE 54X76 (DRAPES) ×3 IMPLANT
DRAPE STERI IOBAN 125X83 (DRAPES) ×3 IMPLANT
DRAPE U-SHAPE 47X51 STRL (DRAPES) ×6 IMPLANT
DRSG MEPILEX BORDER 4X8 (GAUZE/BANDAGES/DRESSINGS) ×3 IMPLANT
ELECT BLADE TIP CTD 4 INCH (ELECTRODE) ×3 IMPLANT
GLOVE BIO SURGEON STRL SZ7.5 (GLOVE) ×6 IMPLANT
GLOVE BIOGEL PI IND STRL 8 (GLOVE) ×2 IMPLANT
GLOVE BIOGEL PI INDICATOR 8 (GLOVE) ×4
GOWN STRL REUS W/TWL LRG LVL3 (GOWN DISPOSABLE) ×3 IMPLANT
GOWN STRL REUS W/TWL XL LVL3 (GOWN DISPOSABLE) ×3 IMPLANT
HEAD CERAMIC FEMORAL 36MM (Head) ×2 IMPLANT
INSERT POLYETHYLENE 36M-0 (Insert) ×2 IMPLANT
KIT TURNOVER KIT A (KITS) IMPLANT
MANIFOLD NEPTUNE II (INSTRUMENTS) ×3 IMPLANT
NS IRRIG 1000ML POUR BTL (IV SOLUTION) ×3 IMPLANT
PACK ANTERIOR HIP CUSTOM (KITS) ×3 IMPLANT
PROTECTOR NERVE ULNAR (MISCELLANEOUS) ×3 IMPLANT
SCREW HEX LP 6.5X20 (Screw) ×2 IMPLANT
SHELL TRIDENT II CLUST 50 (Shell) ×2 IMPLANT
STEM HIP 4 127DEG (Stem) ×2 IMPLANT
SUT MNCRL AB 4-0 PS2 18 (SUTURE) ×3 IMPLANT
SUT STRATAFIX 0 PDS 27 VIOLET (SUTURE) ×3
SUT VIC AB 0 CT1 36 (SUTURE) ×3 IMPLANT
SUT VIC AB 1 CT1 36 (SUTURE) ×3 IMPLANT
SUT VIC AB 2-0 CT1 27 (SUTURE) ×6
SUT VIC AB 2-0 CT1 TAPERPNT 27 (SUTURE) ×2 IMPLANT
SUTURE STRATFX 0 PDS 27 VIOLET (SUTURE) ×1 IMPLANT
TRAY FOLEY MTR SLVR 14FR STAT (SET/KITS/TRAYS/PACK) ×2 IMPLANT
WATER STERILE IRR 1000ML POUR (IV SOLUTION) ×6 IMPLANT
YANKAUER SUCT BULB TIP 10FT TU (MISCELLANEOUS) ×3 IMPLANT

## 2019-06-19 NOTE — Discharge Instructions (Signed)
You may bear weight as tolerated. °Keep your dressing on and dry until follow up. °Take medicine to prevent blood clots as directed. °Take pain medicine as needed with the goal of transitioning to over the counter medicines.  ° °INSTRUCTIONS AFTER JOINT REPLACEMENT  ° °o Remove items at home which could result in a fall. This includes throw rugs or furniture in walking pathways °o ICE to the affected joint every three hours while awake for 30 minutes at a time, for at least the first 3-5 days, and then as needed for pain and swelling.  Continue to use ice for pain and swelling. You may notice swelling that will progress down to the foot and ankle.  This is normal after surgery.  Elevate your leg when you are not up walking on it.   °o Continue to use the breathing machine you got in the hospital (incentive spirometer) which will help keep your temperature down.  It is common for your temperature to cycle up and down following surgery, especially at night when you are not up moving around and exerting yourself.  The breathing machine keeps your lungs expanded and your temperature down. ° ° °DIET:  As you were doing prior to hospitalization, we recommend a well-balanced diet. ° °DRESSING / WOUND CARE / SHOWERING ° °You may shower 3 days after surgery, but keep the wounds dry during showering.  You may use an occlusive plastic wrap (Press'n Seal for example) with blue painter's tape at edges, NO SOAKING/SUBMERGING IN THE BATHTUB.  If the bandage gets wet, change with a clean dry gauze.  If the incision gets wet, pat the wound dry with a clean towel. ° °ACTIVITY ° °o Increase activity slowly as tolerated, but follow the weight bearing instructions below.   °o No driving for 6 weeks or until further direction given by your physician.  You cannot drive while taking narcotics.  °o No lifting or carrying greater than 10 lbs. until further directed by your surgeon. °o Avoid periods of inactivity such as sitting longer than  an hour when not asleep. This helps prevent blood clots.  °o You may return to work once you are authorized by your doctor.  ° ° ° °WEIGHT BEARING  ° °Weight bearing as tolerated with assist device (walker, cane, etc) as directed, use it as long as suggested by your surgeon or therapist, typically at least 4-6 weeks. ° ° °EXERCISES ° °Results after joint replacement surgery are often greatly improved when you follow the exercise, range of motion and muscle strengthening exercises prescribed by your doctor. Safety measures are also important to protect the joint from further injury. Any time any of these exercises cause you to have increased pain or swelling, decrease what you are doing until you are comfortable again and then slowly increase them. If you have problems or questions, call your caregiver or physical therapist for advice.  ° °Rehabilitation is important following a joint replacement. After just a few days of immobilization, the muscles of the leg can become weakened and shrink (atrophy).  These exercises are designed to build up the tone and strength of the thigh and leg muscles and to improve motion. Often times heat used for twenty to thirty minutes before working out will loosen up your tissues and help with improving the range of motion but do not use heat for the first two weeks following surgery (sometimes heat can increase post-operative swelling).  ° °These exercises can be done on a training (exercise)   exercise) mat, on the floor, on a table or on a bed. Use whatever works the best and is most comfortable for you.    Use music or television while you are exercising so that the exercises are a pleasant break in your day. This will make your life better with the exercises acting as a break in your routine that you can look forward to.   Perform all exercises about fifteen times, three times per day or as directed.  You should exercise both the operative leg and the other leg as well.  Exercises  include:    Quad Sets - Tighten up the muscle on the front of the thigh (Quad) and hold for 5-10 seconds.    Straight Leg Raises - With your knee straight (if you were given a brace, keep it on), lift the leg to 60 degrees, hold for 3 seconds, and slowly lower the leg.  Perform this exercise against resistance later as your leg gets stronger.   Leg Slides: Lying on your back, slowly slide your foot toward your buttocks, bending your knee up off the floor (only go as far as is comfortable). Then slowly slide your foot back down until your leg is flat on the floor again.   Angel Wings: Lying on your back spread your legs to the side as far apart as you can without causing discomfort.   Hamstring Strength:  Lying on your back, push your heel against the floor with your leg straight by tightening up the muscles of your buttocks.  Repeat, but this time bend your knee to a comfortable angle, and push your heel against the floor.  You may put a pillow under the heel to make it more comfortable if necessary.   A rehabilitation program following joint replacement surgery can speed recovery and prevent re-injury in the future due to weakened muscles. Contact your doctor or a physical therapist for more information on knee rehabilitation.    CONSTIPATION  Constipation is defined medically as fewer than three stools per week and severe constipation as less than one stool per week.  Even if you have a regular bowel pattern at home, your normal regimen is likely to be disrupted due to multiple reasons following surgery.  Combination of anesthesia, postoperative narcotics, change in appetite and fluid intake all can affect your bowels.   YOU MUST use at least one of the following options; they are listed in order of increasing strength to get the job done.  They are all available over the counter, and you may need to use some, POSSIBLY even all of these options:    Drink plenty of fluids (prune juice may be  helpful) and high fiber foods Colace 100 mg by mouth twice a day  Senokot for constipation as directed and as needed Dulcolax (bisacodyl), take with full glass of water  Miralax (polyethylene glycol) once or twice a day as needed.  If you have tried all these things and are unable to have a bowel movement in the first 3-4 days after surgery call either your surgeon or your primary doctor.    If you experience loose stools or diarrhea, hold the medications until you stool forms back up.  If your symptoms do not get better within 1 week or if they get worse, check with your doctor.  If you experience "the worst abdominal pain ever" or develop nausea or vomiting, please contact the office immediately for further recommendations for treatment.   ITCHING:  If  experience itching with your medications, try taking only a single pain pill, or even half a pain pill at a time.  You can also use Benadryl over the counter for itching or also to help with sleep.  ° °TED HOSE STOCKINGS:  Use stockings on both legs until for at least 2 weeks or as directed by physician office. They may be removed at night for sleeping. ° °MEDICATIONS:  See your medication summary on the “After Visit Summary” that nursing will review with you.  You may have some home medications which will be placed on hold until you complete the course of blood thinner medication.  It is important for you to complete the blood thinner medication as prescribed. ° °PRECAUTIONS:  If you experience chest pain or shortness of breath - call 911 immediately for transfer to the hospital emergency department.  ° °If you develop a fever greater that 101 F, purulent drainage from wound, increased redness or drainage from wound, foul odor from the wound/dressing, or calf pain - CONTACT YOUR SURGEON.   °                                                °FOLLOW-UP APPOINTMENTS:  If you do not already have a post-op appointment, please call the office for an  appointment to be seen by your surgeon.  Guidelines for how soon to be seen are listed in your “After Visit Summary”, but are typically between 1-4 weeks after surgery. ° °OTHER INSTRUCTIONS:  ° ° ° °MAKE SURE YOU:  °• Understand these instructions.  °• Get help right away if you are not doing well or get worse.  ° ° °Thank you for letting us be a part of your medical care team.  It is a privilege we respect greatly.  We hope these instructions will help you stay on track for a fast and full recovery!  ° ° ° ° °

## 2019-06-19 NOTE — Transfer of Care (Signed)
Immediate Anesthesia Transfer of Care Note  Patient: Breanna Davidson  Procedure(s) Performed: Procedure(s): TOTAL HIP ARTHROPLASTY ANTERIOR APPROACH (Left)  Patient Location: PACU  Anesthesia Type:Spinal  Level of Consciousness:  sedated, patient cooperative and responds to stimulation  Airway & Oxygen Therapy:Patient Spontanous Breathing and Patient connected to face mask oxgen  Post-op Assessment:  Report given to PACU RN and Post -op Vital signs reviewed and stable  Post vital signs:  Reviewed and stable  Last Vitals:  Vitals:   06/19/19 0557 06/19/19 0926  BP: 124/70   Pulse: 75 (!) 57  Resp: 18 15  Temp: 37.2 C (!) 36.3 C  SpO2: 123XX123 [id number]    Complications: No apparent anesthesia complications

## 2019-06-19 NOTE — Anesthesia Procedure Notes (Signed)
Spinal  Patient location during procedure: OR Start time: 06/19/2019 7:23 AM End time: 06/19/2019 7:28 AM Reason for block: at surgeon's request Staffing Resident/CRNA: Anne Fu, CRNA Performed: resident/CRNA  Preanesthetic Checklist Completed: patient identified, site marked, surgical consent, pre-op evaluation, timeout performed, IV checked, risks and benefits discussed and monitors and equipment checked Spinal Block Patient position: sitting Prep: DuraPrep Patient monitoring: heart rate, continuous pulse ox and blood pressure Approach: midline Location: L2-3 Injection technique: single-shot Needle Needle type: Pencan  Needle gauge: 24 G Needle length: 9 cm Assessment Sensory level: T6 Additional Notes  Functioning IV was confirmed and monitors were applied. Expiration date of kit checked and confirmed. Sterile prep and drape, including hand hygiene and sterile gloves were used. The patient was positioned and the spine was prepped. The skin was anesthetized with lidocaine.  Free flow of clear CSF was obtained prior to injecting local anesthetic into the CSF X 1 attempt.  The spinal needle aspirated freely following injection.  The needle was carefully withdrawn. Patient tolerated procedure well, without complications. Loss of motor and sensory on exam post injection.

## 2019-06-19 NOTE — Evaluation (Signed)
Physical Therapy Evaluation Patient Details Name: Breanna Davidson MRN: [id number] DOB: 1951/03/30 Today's Date: 06/19/2019   History of Present Illness  L DA-THA  Clinical Impression  Pt is s/p THA resulting in the deficits listed below (see PT Problem List). Pt ambulated 64' with RW, no loss of balance. Good progress expected.  Pt will benefit from skilled PT to increase their independence and safety with mobility to allow discharge to the venue listed below.      Follow Up Recommendations Follow surgeon's recommendation for DC plan and follow-up therapies    Equipment Recommendations  Rolling walker with 5" wheels;3in1 (PT)    Recommendations for Other Services       Precautions / Restrictions Precautions Precautions: Fall Precaution Comments: pt reports 2-3 falls in past 1 year Restrictions Weight Bearing Restrictions: No Other Position/Activity Restrictions: WBAT      Mobility  Bed Mobility Overal bed mobility: Modified Independent             General bed mobility comments: HOB up  Transfers Overall transfer level: Needs assistance Equipment used: Rolling walker (2 wheeled) Transfers: Sit to/from Stand Sit to Stand: Min guard         General transfer comment: VCs hand placement  Ambulation/Gait Ambulation/Gait assistance: Min guard Gait Distance (Feet): 55 Feet Assistive device: Rolling walker (2 wheeled) Gait Pattern/deviations: Step-to pattern;Step-through pattern;Decreased weight shift to left Gait velocity: decr   General Gait Details: no loss of balance, VCs sequencing  Stairs            Wheelchair Mobility    Modified Rankin (Stroke Patients Only)       Balance Overall balance assessment: Modified Independent                                           Pertinent Vitals/Pain Pain Assessment: 0-10 Pain Score: 3  Pain Location: L hip Pain Descriptors / Indicators: Sore Pain Intervention(s): Limited activity  within patient's tolerance;Monitored during session;Premedicated before session;Ice applied    Home Living Family/patient expects to be discharged to:: Private residence Living Arrangements: Spouse/significant other Available Help at Discharge: Family;Available 24 hours/day   Home Access: Stairs to enter Entrance Stairs-Rails: Can reach both;Left;Right Entrance Stairs-Number of Steps: 3 Home Layout: One level Home Equipment: None      Prior Function Level of Independence: Independent               Hand Dominance        Extremity/Trunk Assessment   Upper Extremity Assessment Upper Extremity Assessment: Overall WFL for tasks assessed    Lower Extremity Assessment Lower Extremity Assessment: LLE deficits/detail LLE Deficits / Details: L hip flexion AAROM ~40*, knee ext at least 3/5 LLE Sensation: WNL LLE Coordination: WNL    Cervical / Trunk Assessment Cervical / Trunk Assessment: Normal  Communication   Communication: No difficulties  Cognition Arousal/Alertness: Awake/alert Behavior During Therapy: WFL for tasks assessed/performed Overall Cognitive Status: Within Functional Limits for tasks assessed                                        General Comments      Exercises Total Joint Exercises Heel Slides: AAROM;Left;10 reps;Supine Long Arc Quad: AROM;Left;5 reps;Seated   Assessment/Plan    PT Assessment Patient needs continued PT services  PT Problem List Decreased strength;Decreased range of motion;Decreased activity tolerance;Decreased mobility;Pain       PT Treatment Interventions DME instruction;Gait training;Stair training;Functional mobility training;Therapeutic exercise;Therapeutic activities;Patient/family education    PT Goals (Current goals can be found in the Care Plan section)  Acute Rehab PT Goals Patient Stated Goal: play golf PT Goal Formulation: With patient/family Time For Goal Achievement: 07/02/19 Potential to  Achieve Goals: Good    Frequency 7X/week   Barriers to discharge        Co-evaluation               AM-PAC PT "6 Clicks" Mobility  Outcome Measure Help needed turning from your back to your side while in a flat bed without using bedrails?: A Little Help needed moving from lying on your back to sitting on the side of a flat bed without using bedrails?: A Little Help needed moving to and from a bed to a chair (including a wheelchair)?: A Little Help needed standing up from a chair using your arms (e.g., wheelchair or bedside chair)?: A Little Help needed to walk in hospital room?: A Little Help needed climbing 3-5 steps with a railing? : A Little 6 Click Score: 18    End of Session Equipment Utilized During Treatment: Gait belt Activity Tolerance: Patient tolerated treatment well Patient left: in chair;with call bell/phone within reach;with family/visitor present Nurse Communication: Mobility status PT Visit Diagnosis: Difficulty in walking, not elsewhere classified (R26.2);Pain Pain - Right/Left: Left Pain - part of body: Hip    Time: 1341-1402 PT Time Calculation (min) (ACUTE ONLY): 21 min   Charges:   PT Evaluation $PT Eval Low Complexity: 1 Low         [name] PT 06/19/2019  Acute Rehabilitation Services Pager [phone] Office [phone]

## 2019-06-19 NOTE — Anesthesia Postprocedure Evaluation (Signed)
Anesthesia Post Note  Patient: Ashe Bengston Shrieves  Procedure(s) Performed: TOTAL HIP ARTHROPLASTY ANTERIOR APPROACH (Left Hip)     Patient location during evaluation: PACU Anesthesia Type: Spinal Level of consciousness: oriented and awake and alert Pain management: pain level controlled Vital Signs Assessment: post-procedure vital signs reviewed and stable Respiratory status: spontaneous breathing, respiratory function stable and nonlabored ventilation Cardiovascular status: blood pressure returned to baseline and stable Postop Assessment: no headache, no backache, no apparent nausea or vomiting and spinal receding Anesthetic complications: no    Last Vitals:  Vitals:   06/19/19 1100 06/19/19 1119  BP: 140/63 124/70  Pulse: (!) 48 (!) 49  Resp: 11 16  Temp: (!) 36.4 C (!) 36.4 C  SpO2: 100% 100%    Last Pain:  Vitals:   06/19/19 1127  TempSrc:   PainSc: 0-No pain                 Lidia Collum

## 2019-06-19 NOTE — Op Note (Signed)
06/19/2019  8:40 AM  PATIENT:  Breanna Davidson   MRN: [id number]  PRE-OPERATIVE DIAGNOSIS:  OA LEFT HIP  POST-OPERATIVE DIAGNOSIS:  OA LEFT HIP  PROCEDURE:  Procedure(s): TOTAL HIP ARTHROPLASTY ANTERIOR APPROACH  PREOPERATIVE INDICATIONS:    Idali Lafever is an 68 y.o. female who has a diagnosis of Primary osteoarthritis of left hip and elected for surgical management after failing conservative treatment.  The risks benefits and alternatives were discussed with the patient including but not limited to the risks of nonoperative treatment, versus surgical intervention including infection, bleeding, nerve injury, periprosthetic fracture, the need for revision surgery, dislocation, leg length discrepancy, blood clots, cardiopulmonary complications, morbidity, mortality, among others, and they were willing to proceed.     OPERATIVE REPORT     SURGEON:   Renette Butters, MD    ASSISTANT:  Roxan Hockey, PA-C, he was present and scrubbed throughout the case, critical for completion in a timely fashion, and for retraction, instrumentation, and closure.     ANESTHESIA:  General    COMPLICATIONS:  None.     COMPONENTS:  [employer]ker acolade fit femur size 4 with a 36 mm -0 head ball and a PSL acetabular shell size 50 with a  polyethylene liner    PROCEDURE IN DETAIL:   The patient was met in the holding area and  identified.  The appropriate hip was identified and marked at the operative site.  The patient was then transported to the OR  and  placed under anesthesia per that record.  At that point, the patient was  placed in the supine position and  secured to the operating room table and all bony prominences padded. He received pre-operative antibiotics    The operative lower extremity was prepped from the iliac crest to the distal leg.  Sterile draping was performed.  Time out was performed prior to incision.      Skin incision was made just 2 cm lateral to the ASIS  extending in  line with the tensor fascia lata. Electrocautery was used to control all bleeders. I dissected down sharply to the fascia of the tensor fascia lata was confirmed that the muscle fibers beneath were running posteriorly. I then incised the fascia over the superficial tensor fascia lata in line with the incision. The fascia was elevated off the anterior aspect of the muscle the muscle was retracted posteriorly and protected throughout the case. I then used electrocautery to incise the tensor fascia lata fascia control and all bleeders. Immediately visible was the fat over top of the anterior neck and capsule.  I removed the anterior fat from the capsule and elevated the rectus muscle off of the anterior capsule. I then removed a large time of capsule. The retractors were then placed over the anterior acetabulum as well as around the superior and inferior neck.  I then made a femoral neck cut. Then used the power corkscrew to remove the femoral head from the acetabulum and thoroughly irrigated the acetabulum. I sized the femoral head.    I then exposed the deep acetabulum, cleared out any tissue including the ligamentum teres.   After adequate visualization, I excised the labrum, and then sequentially reamed.  I then impacted the acetabular implant into place using fluoroscopy for guidance.  Appropriate version and inclination was confirmed clinically matching their bony anatomy, and with fluoroscopy.  I placed a 20 mm screw in the posterior/superio position with an excellent bite.    I then placed the polyethylene  liner in place  I then adducted the leg and released the external rotators from the posterior femur allowing it to be easily delivered up lateral and anterior to the acetabulum for preparation of the femoral canal.    I then prepared the proximal femur using the cookie-cutter and then sequentially reamed and broached.  A trial broach, neck, and head was utilized, and I reduced the hip and used  floroscopy to assess the neck length and femoral implant.  I then impacted the femoral prosthesis into place into the appropriate version. The hip was then reduced and fluoroscopy confirmed appropriate position. Leg lengths were restored.  I then irrigated the hip copiously again with, and repaired the fascia with Vicryl, followed by monocryl for the subcutaneous tissue, Monocryl for the skin, Steri-Strips and sterile gauze. The patient was then awakened and returned to PACU in stable and satisfactory condition. There were no complications.  POST OPERATIVE PLAN: WBAT, DVT px: SCD's/TED, ambulation and chemical dvt px  Edmonia Lynch, MD Orthopedic Surgeon [phone]

## 2019-06-19 NOTE — Interval H&P Note (Signed)
I participated in the care of this patient and agree with the above history, physical and evaluation. I performed a review of the history and a physical exam as detailed   Breah Joa Daniel Anslee Micheletti MD  

## 2019-06-20 ENCOUNTER — Encounter (HOSPITAL_COMMUNITY): Payer: Self-pay | Admitting: Orthopedic Surgery

## 2019-06-20 DIAGNOSIS — Z791 Long term (current) use of non-steroidal anti-inflammatories (NSAID): Secondary | ICD-10-CM | POA: Diagnosis not present

## 2019-06-20 DIAGNOSIS — Z79899 Other long term (current) drug therapy: Secondary | ICD-10-CM | POA: Diagnosis not present

## 2019-06-20 DIAGNOSIS — M8580 Other specified disorders of bone density and structure, unspecified site: Secondary | ICD-10-CM | POA: Diagnosis not present

## 2019-06-20 DIAGNOSIS — Z888 Allergy status to other drugs, medicaments and biological substances status: Secondary | ICD-10-CM | POA: Diagnosis not present

## 2019-06-20 DIAGNOSIS — E7800 Pure hypercholesterolemia, unspecified: Secondary | ICD-10-CM | POA: Diagnosis not present

## 2019-06-20 DIAGNOSIS — E782 Mixed hyperlipidemia: Secondary | ICD-10-CM | POA: Diagnosis not present

## 2019-06-20 DIAGNOSIS — M1612 Unilateral primary osteoarthritis, left hip: Secondary | ICD-10-CM | POA: Diagnosis not present

## 2019-06-20 NOTE — Discharge Summary (Signed)
Discharge Summary  Patient ID: Breanna Davidson MRN: [id number] DOB/AGE: [birth year] 68 y.o.  Admit date: 06/19/2019 Discharge date: 06/20/2019  Admission Diagnoses:  Primary osteoarthritis of left hip  Discharge Diagnoses:  Principal Problem:   Primary osteoarthritis of left hip Active Problems:   Hypercholesteremia   Depression   Primary localized osteoarthritis of hip   Past Medical History:  Diagnosis Date  . Allergy   . Chest pain    2017 ED VISIT FOR CHEST PAIN , R/O AS GASTRIC CAUSE   . Hyperlipidemia   . Increased pressure in the eye    "THEY DID SURGERY ON MY EYES TO REDUCE THE PRESSURE , THEY MADE TWO HOLES IN MY EYES "  . Osteopenia   . Spinal stenosis    LUMBAR   . Vertigo    IMPROVED BUT HAS OCCASIONAL SX    Surgeries: Procedure(s): TOTAL HIP ARTHROPLASTY ANTERIOR APPROACH on 06/19/2019   Consultants (if any):   Discharged Condition: Improved  Hospital Course: Breanna Davidson is an 68 y.o. female who was admitted 06/19/2019 with a diagnosis of Primary osteoarthritis of left hip and went to the operating room on 06/19/2019 and underwent the above named procedures.    She was given perioperative antibiotics:  Anti-infectives (From admission, onward)   Start     Dose/Rate Route Frequency Ordered Stop   06/19/19 1330  ceFAZolin (ANCEF) IVPB 1 g/50 mL premix     1 g 100 mL/hr over 30 Minutes Intravenous Every 6 hours 06/19/19 1124 06/19/19 2017   06/19/19 0600  ceFAZolin (ANCEF) IVPB 2g/100 mL premix     2 g 200 mL/hr over 30 Minutes Intravenous On call to O.R. 06/19/19 0535 06/19/19 0730    .  She was given sequential compression devices, early ambulation, and aspirin for DVT prophylaxis.  She benefited maximally from the hospital stay and there were no complications.    Recent vital signs:  Vitals:   06/20/19 0052 06/20/19 0511  BP: (!) 110/51 125/72  Pulse: 81 76  Resp: 14 14  Temp: 98.3 F (36.8 C) 98.9 F (37.2 C)  SpO2: 96% 97%     Recent laboratory studies:  Lab Results  Component Value Date   HGB 13.4 06/12/2019   HGB 13.1 05/23/2019   HGB 12.0 08/01/2018   Lab Results  Component Value Date   WBC 6.1 06/12/2019   PLT 312 06/12/2019   No results found for: INR Lab Results  Component Value Date   NA 137 06/12/2019   K 4.4 06/12/2019   CL 105 06/12/2019   CO2 26 06/12/2019   BUN 18 06/12/2019   CREATININE 0.85 06/12/2019   GLUCOSE 98 06/12/2019    Discharge Medications:   Allergies as of 06/20/2019      Reactions   Prednisone Other (See Comments)   Altered Mental Status      Medication List    TAKE these medications   aspirin EC 81 MG tablet Take 1 tablet (81 mg total) by mouth 2 (two) times daily. For DVT prophylaxis for 30 days after surgery.   CALCIUM MAGNESIUM PO Take 1 tablet by mouth daily.   CO Q 10 PO Take 30 mg by mouth daily.   docusate sodium 100 MG capsule Commonly known as: Colace Take 1 capsule (100 mg total) by mouth 2 (two) times daily. To prevent constipation while taking pain medication.   DULoxetine 30 MG capsule Commonly known as: CYMBALTA TAKE 1 CAPSULE EVERY DAY   Fish Oil  1200 MG Caps Take 1,200 mg by mouth daily.   gabapentin 100 MG capsule Commonly known as: Neurontin Take 1 capsule (100 mg total) by mouth 2 (two) times daily as needed for up to 10 days. For pain.   HYDROcodone-acetaminophen 5-325 MG tablet Commonly known as: Norco Take 1-2 tablets by mouth every 6 (six) hours as needed for severe pain.   meloxicam 7.5 MG tablet Commonly known as: MOBIC Take 7.5 mg by mouth daily.   methocarbamol 500 MG tablet Commonly known as: Robaxin Take 1 tablet (500 mg total) by mouth every 8 (eight) hours as needed for muscle spasms.   omeprazole 20 MG capsule Commonly known as: PriLOSEC Take 1 capsule (20 mg total) by mouth daily. 30 days for gastroprotection while taking NSAIDs.   ondansetron 4 MG tablet Commonly known as: Zofran Take 1 tablet (4  mg total) by mouth every 8 (eight) hours as needed for nausea or vomiting.   Red Yeast Rice 600 MG Tabs Take 600 mg by mouth daily.   TYLENOL PO Take by mouth.   Vitamin D 50 MCG (2000 UT) Caps Take 2,000 Units by mouth daily.       Diagnostic Studies: Dg C-arm 1-60 Min-no Report  Result Date: 06/19/2019 Fluoroscopy was utilized by the requesting physician.  No radiographic interpretation.   Dg Hip Operative Unilat W Or W/o Pelvis Left  Result Date: 06/19/2019 CLINICAL DATA:  Left anterior hip replacement EXAM: OPERATIVE LEFT HIP (WITH PELVIS IF PERFORMED) 2 VIEWS TECHNIQUE: Fluoroscopic spot image(s) were submitted for interpretation post-operatively. COMPARISON:  03/28/2018. FINDINGS: Total left hip replacement with anatomic alignment. IMPRESSION: Total left hip replacement with anatomic alignment. Electronically Signed   By: Marcello Moores  Register   On: 06/19/2019 09:52    Disposition: Discharge disposition: 01-Home or Self Care       Discharge Instructions    Discharge patient   Complete by: As directed    After A.M. therapy   Discharge disposition: 01-Home or Self Care   Discharge patient date: 06/20/2019      Follow-up Information    Renette Butters, MD.   Specialty: Orthopedic Surgery Contact information: [address] [place] [address] [phone]            Signed: Prudencio Burly III PA-C 06/20/2019, 7:32 AM

## 2019-06-20 NOTE — Progress Notes (Signed)
    Subjective: Patient reports pain as mild, controlled.  Tolerating diet.  Urinating.  No CP, SOB.  Good mobilization with therapy yesterday.  Objective:   VITALS:   Vitals:   06/19/19 1509 06/19/19 2144 06/20/19 0052 06/20/19 0511  BP: (!) 130/59 (!) 111/47 (!) 110/51 125/72  Pulse: 68 81 81 76  Resp: 14 16 14 14   Temp: 98.4 F (36.9 C) 98.3 F (36.8 C) 98.3 F (36.8 C) 98.9 F (37.2 C)  TempSrc: Oral Oral Oral Oral  SpO2: 98% 99% 96% 97%  Weight:      Height:       CBC Latest Ref Rng & Units 06/12/2019 05/23/2019 08/01/2018  WBC 4.0 - 10.5 K/uL 6.1 5.6 6.8  Hemoglobin 12.0 - 15.0 g/dL 13.4 13.1 12.0  Hematocrit 36.0 - 46.0 % 41.6 38.7 36.3  Platelets 150 - 400 K/uL 312 337 294   BMP Latest Ref Rng & Units 06/12/2019 05/23/2019 07/26/2017  Glucose 70 - 99 mg/dL 98 102(H) 78  BUN 8 - 23 mg/dL 18 20 20   Creatinine 0.44 - 1.00 mg/dL 0.85 0.68 0.82  BUN/Creat Ratio 12 - 28 - 29(H) 24  Sodium 135 - 145 mmol/L 137 137 141  Potassium 3.5 - 5.1 mmol/L 4.4 4.3 4.5  Chloride 98 - 111 mmol/L 105 103 104  CO2 22 - 32 mmol/L 26 21 23   Calcium 8.9 - 10.3 mg/dL 9.3 9.3 9.4   Intake/Output      08/25 0701 - 08/26 0700 08/26 0701 - 08/27 0700   P.O. 600    I.V. (mL/kg) 3598.7 (48.7)    IV Piggyback 500    Total Intake(mL/kg) 4698.7 (63.6)    Urine (mL/kg/hr) 2175 (1.2)    Blood 250    Total Output 2425    Net +2273.7            Physical Exam: General: NAD.  Upright in bed.  Calm, conversant. Resp: No increased wob Cardio: regular rate and rhythm ABD soft Neurologically intact MSK LLE: Neurovascularly intact Sensation intact distally Feet warm Dorsiflexion/Plantar flexion intact Incision: dressing C/D/I   Assessment: 1 Day Post-Op  S/P Procedure(s) (LRB): TOTAL HIP ARTHROPLASTY ANTERIOR APPROACH (Left) by Dr. Ernesta Amble. Percell Miller on 06/19/2019  Principal Problem:   Primary osteoarthritis of left hip Active Problems:   Hypercholesteremia   Depression   Primary  localized osteoarthritis of hip   Primary osteoarthritis, status post total hip arthroplasty Doing well postop day 1 Tolerating diet and voiding Pain controlled Mobilizing well  Plan: Up with therapy Incentive Spirometry Apply ice   Weight Bearing: Weight Bearing as Tolerated (WBAT) LLE Dressings: Maintain Mepilex.   VTE prophylaxis: Aspirin 81 mg twice daily x 30 days, SCDs, ambulation Dispo: Home today after a.m. therapy evaluation   Breanna Burly III, PA-C 06/20/2019, 7:29 AM

## 2019-06-20 NOTE — Progress Notes (Signed)
Physical Therapy Treatment Patient Details Name: Breanna Davidson MRN: [id number] DOB: Dec 07, 1950 Today's Date: 06/20/2019    History of Present Illness L DA-THA    PT Comments    Pt ambulated 140' with RW, no loss of balance. Stair training completed. Pt demonstrates good understanding of THA HEP. She is ready to DC home from PT standpoint, PT goals have been met.   Follow Up Recommendations  Follow surgeon's recommendation for DC plan and follow-up therapies     Equipment Recommendations  Rolling walker with 5" wheels;3in1 (PT)    Recommendations for Other Services       Precautions / Restrictions Precautions Precautions: Fall Precaution Comments: pt reports 2-3 falls in past 1 year Restrictions Weight Bearing Restrictions: No Other Position/Activity Restrictions: WBAT    Mobility  Bed Mobility Overal bed mobility: Modified Independent             General bed mobility comments: HOB up  Transfers Overall transfer level: Needs assistance Equipment used: Rolling walker (2 wheeled) Transfers: Sit to/from Stand Sit to Stand: Supervision         General transfer comment: VCs hand placement  Ambulation/Gait Ambulation/Gait assistance: Modified independent (Device/Increase time) Gait Distance (Feet): 140 Feet Assistive device: Rolling walker (2 wheeled) Gait Pattern/deviations: Step-to pattern;Step-through pattern;Decreased weight shift to left Gait velocity: decr   General Gait Details: no loss of balance, VCs sequencing   Stairs Stairs: Yes Stairs assistance: Min guard Stair Management: Two rails;Step to pattern;Forwards Number of Stairs: 6 General stair comments: 3 stairs x 2 trials, VCs sequencing   Wheelchair Mobility    Modified Rankin (Stroke Patients Only)       Balance Overall balance assessment: Modified Independent                                          Cognition Arousal/Alertness: Awake/alert Behavior  During Therapy: WFL for tasks assessed/performed Overall Cognitive Status: Within Functional Limits for tasks assessed                                        Exercises Total Joint Exercises Ankle Circles/Pumps: AROM;Both;10 reps;Supine Quad Sets: AROM;Left;5 reps;Supine Short Arc Quad: AROM;Left;10 reps;Supine Heel Slides: AAROM;Left;10 reps;Supine Hip ABduction/ADduction: AAROM;Left;10 reps;Supine;Standing Long Arc Quad: AROM;Left;5 reps;Seated Knee Flexion: AROM;Left;5 reps;Standing Marching in Standing: AROM;Left;5 reps;Standing Standing Hip Extension: AROM;Left;5 reps;Standing    General Comments        Pertinent Vitals/Pain Pain Score: 3  Pain Location: L hip Pain Descriptors / Indicators: Sore Pain Intervention(s): Limited activity within patient's tolerance;Monitored during session;Premedicated before session;Ice applied    Home Living                      Prior Function            PT Goals (current goals can now be found in the care plan section) Acute Rehab PT Goals Patient Stated Goal: play golf PT Goal Formulation: With patient/family Time For Goal Achievement: 07/02/19 Potential to Achieve Goals: Good    Frequency    7X/week      PT Plan Current plan remains appropriate    Co-evaluation              AM-PAC PT "6 Clicks" Mobility   Outcome Measure  Help needed turning  from your back to your side while in a flat bed without using bedrails?: None Help needed moving from lying on your back to sitting on the side of a flat bed without using bedrails?: None Help needed moving to and from a bed to a chair (including a wheelchair)?: None Help needed standing up from a chair using your arms (e.g., wheelchair or bedside chair)?: None Help needed to walk in hospital room?: None Help needed climbing 3-5 steps with a railing? : A Little 6 Click Score: 23    End of Session Equipment Utilized During Treatment: Gait  belt Activity Tolerance: Patient tolerated treatment well Patient left: in chair;with call bell/phone within reach Nurse Communication: Mobility status PT Visit Diagnosis: Difficulty in walking, not elsewhere classified (R26.2);Pain Pain - Right/Left: Left Pain - part of body: Hip     Time: 2284-0698 PT Time Calculation (min) (ACUTE ONLY): 23 min  Charges:  $Gait Training: 8-22 mins $Therapeutic Exercise: 8-22 mins                     [name] PT 06/20/2019  Acute Rehabilitation Services Pager [phone] Office [phone]

## 2019-06-22 DIAGNOSIS — Z9181 History of falling: Secondary | ICD-10-CM | POA: Diagnosis not present

## 2019-06-22 DIAGNOSIS — M48061 Spinal stenosis, lumbar region without neurogenic claudication: Secondary | ICD-10-CM | POA: Diagnosis not present

## 2019-06-22 DIAGNOSIS — R42 Dizziness and giddiness: Secondary | ICD-10-CM | POA: Diagnosis not present

## 2019-06-22 DIAGNOSIS — Z96642 Presence of left artificial hip joint: Secondary | ICD-10-CM | POA: Diagnosis not present

## 2019-06-22 DIAGNOSIS — M8580 Other specified disorders of bone density and structure, unspecified site: Secondary | ICD-10-CM | POA: Diagnosis not present

## 2019-06-22 DIAGNOSIS — E785 Hyperlipidemia, unspecified: Secondary | ICD-10-CM | POA: Diagnosis not present

## 2019-06-22 DIAGNOSIS — Z471 Aftercare following joint replacement surgery: Secondary | ICD-10-CM | POA: Diagnosis not present

## 2019-06-26 DIAGNOSIS — M8580 Other specified disorders of bone density and structure, unspecified site: Secondary | ICD-10-CM | POA: Diagnosis not present

## 2019-06-26 DIAGNOSIS — R42 Dizziness and giddiness: Secondary | ICD-10-CM | POA: Diagnosis not present

## 2019-06-26 DIAGNOSIS — M48061 Spinal stenosis, lumbar region without neurogenic claudication: Secondary | ICD-10-CM | POA: Diagnosis not present

## 2019-06-26 DIAGNOSIS — Z96642 Presence of left artificial hip joint: Secondary | ICD-10-CM | POA: Diagnosis not present

## 2019-06-26 DIAGNOSIS — Z471 Aftercare following joint replacement surgery: Secondary | ICD-10-CM | POA: Diagnosis not present

## 2019-06-26 DIAGNOSIS — E785 Hyperlipidemia, unspecified: Secondary | ICD-10-CM | POA: Diagnosis not present

## 2019-06-26 DIAGNOSIS — Z9181 History of falling: Secondary | ICD-10-CM | POA: Diagnosis not present

## 2019-06-28 DIAGNOSIS — Z9181 History of falling: Secondary | ICD-10-CM | POA: Diagnosis not present

## 2019-06-28 DIAGNOSIS — R42 Dizziness and giddiness: Secondary | ICD-10-CM | POA: Diagnosis not present

## 2019-06-28 DIAGNOSIS — M8580 Other specified disorders of bone density and structure, unspecified site: Secondary | ICD-10-CM | POA: Diagnosis not present

## 2019-06-28 DIAGNOSIS — Z471 Aftercare following joint replacement surgery: Secondary | ICD-10-CM | POA: Diagnosis not present

## 2019-06-28 DIAGNOSIS — E785 Hyperlipidemia, unspecified: Secondary | ICD-10-CM | POA: Diagnosis not present

## 2019-06-28 DIAGNOSIS — M48061 Spinal stenosis, lumbar region without neurogenic claudication: Secondary | ICD-10-CM | POA: Diagnosis not present

## 2019-06-28 DIAGNOSIS — Z96642 Presence of left artificial hip joint: Secondary | ICD-10-CM | POA: Diagnosis not present

## 2019-06-29 DIAGNOSIS — Z471 Aftercare following joint replacement surgery: Secondary | ICD-10-CM | POA: Diagnosis not present

## 2019-06-29 DIAGNOSIS — E785 Hyperlipidemia, unspecified: Secondary | ICD-10-CM | POA: Diagnosis not present

## 2019-06-29 DIAGNOSIS — Z96642 Presence of left artificial hip joint: Secondary | ICD-10-CM | POA: Diagnosis not present

## 2019-06-29 DIAGNOSIS — Z9181 History of falling: Secondary | ICD-10-CM | POA: Diagnosis not present

## 2019-06-29 DIAGNOSIS — R42 Dizziness and giddiness: Secondary | ICD-10-CM | POA: Diagnosis not present

## 2019-06-29 DIAGNOSIS — M48061 Spinal stenosis, lumbar region without neurogenic claudication: Secondary | ICD-10-CM | POA: Diagnosis not present

## 2019-06-29 DIAGNOSIS — M8580 Other specified disorders of bone density and structure, unspecified site: Secondary | ICD-10-CM | POA: Diagnosis not present

## 2019-06-30 ENCOUNTER — Other Ambulatory Visit: Payer: Self-pay | Admitting: Nurse Practitioner

## 2019-06-30 NOTE — Telephone Encounter (Signed)
Requested medication (s) are due for refill today:  Yes   Requested medication (s) are on the active medication list:  yes  Future visit scheduled:  No  Last Refill: 04/10/19; #30; RF x 3  Note to clinic: pt. was to trial 60 mg. Duloxetine, starting from time of OV 7/23, and was to return in 4 weeks.  Please advise.  Also will need dosage increase noted in script.    Requested Prescriptions  Pending Prescriptions Disp Refills   DULoxetine (CYMBALTA) 30 MG capsule [Pharmacy Med Name: DULOXETINE HCL 30 MG CAP] 30 capsule 3    Sig: TAKE 1 CAPSULE BY MOUTH EVERY DAY     Psychiatry: Antidepressants - SNRI Failed - 06/30/2019  8:49 AM      Failed - Last BP in normal range    BP Readings from Last 1 Encounters:  06/20/19 140/63         Passed - Valid encounter within last 6 months    Recent Outpatient Visits          1 month ago Pre-op testing   [facility], PA-C   1 month ago Mild episode of recurrent major depressive disorder ([place])   [facility], Jolene T, NP   4 months ago Mild episode of recurrent major depressive disorder ([place])   [place], Jolene T, NP   6 months ago Mild episode of recurrent major depressive disorder ([place])   [place], Barbaraann Faster, NP   11 months ago Immunization due   [employer], [place] T, NP             Passed - Completed PHQ-2 or PHQ-9 in the last 360 days.

## 2019-07-03 DIAGNOSIS — R42 Dizziness and giddiness: Secondary | ICD-10-CM | POA: Diagnosis not present

## 2019-07-03 DIAGNOSIS — M48061 Spinal stenosis, lumbar region without neurogenic claudication: Secondary | ICD-10-CM | POA: Diagnosis not present

## 2019-07-03 DIAGNOSIS — Z96642 Presence of left artificial hip joint: Secondary | ICD-10-CM | POA: Diagnosis not present

## 2019-07-03 DIAGNOSIS — E785 Hyperlipidemia, unspecified: Secondary | ICD-10-CM | POA: Diagnosis not present

## 2019-07-03 DIAGNOSIS — Z9181 History of falling: Secondary | ICD-10-CM | POA: Diagnosis not present

## 2019-07-03 DIAGNOSIS — Z471 Aftercare following joint replacement surgery: Secondary | ICD-10-CM | POA: Diagnosis not present

## 2019-07-03 DIAGNOSIS — M8580 Other specified disorders of bone density and structure, unspecified site: Secondary | ICD-10-CM | POA: Diagnosis not present

## 2019-07-04 DIAGNOSIS — M1612 Unilateral primary osteoarthritis, left hip: Secondary | ICD-10-CM | POA: Diagnosis not present

## 2019-07-05 DIAGNOSIS — M48061 Spinal stenosis, lumbar region without neurogenic claudication: Secondary | ICD-10-CM | POA: Diagnosis not present

## 2019-07-05 DIAGNOSIS — R42 Dizziness and giddiness: Secondary | ICD-10-CM | POA: Diagnosis not present

## 2019-07-05 DIAGNOSIS — Z9181 History of falling: Secondary | ICD-10-CM | POA: Diagnosis not present

## 2019-07-05 DIAGNOSIS — E785 Hyperlipidemia, unspecified: Secondary | ICD-10-CM | POA: Diagnosis not present

## 2019-07-05 DIAGNOSIS — M8580 Other specified disorders of bone density and structure, unspecified site: Secondary | ICD-10-CM | POA: Diagnosis not present

## 2019-07-05 DIAGNOSIS — Z471 Aftercare following joint replacement surgery: Secondary | ICD-10-CM | POA: Diagnosis not present

## 2019-07-05 DIAGNOSIS — Z96642 Presence of left artificial hip joint: Secondary | ICD-10-CM | POA: Diagnosis not present

## 2019-07-06 DIAGNOSIS — M48061 Spinal stenosis, lumbar region without neurogenic claudication: Secondary | ICD-10-CM | POA: Diagnosis not present

## 2019-07-06 DIAGNOSIS — R42 Dizziness and giddiness: Secondary | ICD-10-CM | POA: Diagnosis not present

## 2019-07-06 DIAGNOSIS — Z471 Aftercare following joint replacement surgery: Secondary | ICD-10-CM | POA: Diagnosis not present

## 2019-07-06 DIAGNOSIS — E785 Hyperlipidemia, unspecified: Secondary | ICD-10-CM | POA: Diagnosis not present

## 2019-07-06 DIAGNOSIS — M8580 Other specified disorders of bone density and structure, unspecified site: Secondary | ICD-10-CM | POA: Diagnosis not present

## 2019-07-06 DIAGNOSIS — Z9181 History of falling: Secondary | ICD-10-CM | POA: Diagnosis not present

## 2019-07-06 DIAGNOSIS — Z96642 Presence of left artificial hip joint: Secondary | ICD-10-CM | POA: Diagnosis not present

## 2019-07-18 DIAGNOSIS — H40053 Ocular hypertension, bilateral: Secondary | ICD-10-CM | POA: Diagnosis not present

## 2019-07-18 DIAGNOSIS — H40039 Anatomical narrow angle, unspecified eye: Secondary | ICD-10-CM | POA: Diagnosis not present

## 2019-07-26 ENCOUNTER — Other Ambulatory Visit: Payer: Self-pay | Admitting: Nurse Practitioner

## 2019-07-26 DIAGNOSIS — Z1231 Encounter for screening mammogram for malignant neoplasm of breast: Secondary | ICD-10-CM

## 2019-07-31 ENCOUNTER — Other Ambulatory Visit: Payer: Self-pay

## 2019-07-31 ENCOUNTER — Ambulatory Visit (INDEPENDENT_AMBULATORY_CARE_PROVIDER_SITE_OTHER): Payer: Medicare Other | Admitting: Nurse Practitioner

## 2019-07-31 ENCOUNTER — Encounter: Payer: Self-pay | Admitting: Nurse Practitioner

## 2019-07-31 VITALS — BP 137/75 | HR 65 | Temp 98.9°F

## 2019-07-31 DIAGNOSIS — M1612 Unilateral primary osteoarthritis, left hip: Secondary | ICD-10-CM | POA: Diagnosis not present

## 2019-07-31 DIAGNOSIS — Z23 Encounter for immunization: Secondary | ICD-10-CM

## 2019-07-31 NOTE — Progress Notes (Signed)
BP 137/75   Pulse 65   Temp 98.9 F (37.2 C) (Oral)   LMP  (LMP Unknown)   SpO2 97%    Subjective:    Patient ID: Breanna Davidson, female    DOB: 08-19-1951, 68 y.o.   MRN: [id number]  HPI: Breanna Davidson is a 68 y.o. female  Chief Complaint  Patient presents with  . Hip Pain   HIP PAIN:  Had recent total hip arthroplasty to left hip on 06/19/2019 by Dr. Percell Miller.  Reports vast improvement in pain to left leg, no further knee pain.  States only notices pain initially, at getting up an moving to anterior hip, but starts walking and begins to feel better.  Went to physical therapy and has now been discharged.  Is going to return to traveling soon and reports feeling 100% better.  Relevant past medical, surgical, family and social history reviewed and updated as indicated. Interim medical history since our last visit reviewed. Allergies and medications reviewed and updated.  Review of Systems  Constitutional: Negative for activity change, appetite change, diaphoresis, fatigue and fever.  Respiratory: Negative for cough, chest tightness and shortness of breath.   Cardiovascular: Negative for chest pain, palpitations and leg swelling.  Gastrointestinal: Negative for abdominal distention, abdominal pain, constipation, diarrhea, nausea and vomiting.  Musculoskeletal: Negative for arthralgias.  Neurological: Negative for dizziness, syncope, weakness, light-headedness, numbness and headaches.  Psychiatric/Behavioral: Negative.     Per HPI unless specifically indicated above     Objective:    BP 137/75   Pulse 65   Temp 98.9 F (37.2 C) (Oral)   LMP  (LMP Unknown)   SpO2 97%   Wt Readings from Last 3 Encounters:  06/19/19 163 lb (73.9 kg)  06/12/19 163 lb (73.9 kg)  05/31/19 164 lb 8 oz (74.6 kg)    Physical Exam Vitals signs and nursing note reviewed.  Constitutional:      General: She is awake. She is not in acute distress.    Appearance: She is well-developed  and overweight. She is not ill-appearing.  HENT:     Head: Normocephalic.     Right Ear: Hearing normal.     Left Ear: Hearing normal.  Eyes:     General: Lids are normal.        Right eye: No discharge.        Left eye: No discharge.     Conjunctiva/sclera: Conjunctivae normal.     Pupils: Pupils are equal, round, and reactive to light.  Neck:     Musculoskeletal: Normal range of motion and neck supple.     Vascular: No carotid bruit.  Cardiovascular:     Rate and Rhythm: Normal rate and regular rhythm.     Heart sounds: Normal heart sounds. No murmur. No gallop.   Pulmonary:     Effort: Pulmonary effort is normal. No accessory muscle usage or respiratory distress.     Breath sounds: Normal breath sounds.  Abdominal:     General: Bowel sounds are normal.     Palpations: Abdomen is soft.  Musculoskeletal:     Right hip: She exhibits normal range of motion, normal strength, no tenderness, no swelling and no crepitus.     Left hip: She exhibits normal range of motion, normal strength, no tenderness, no swelling and no crepitus.     Right lower leg: No edema.     Left lower leg: No edema.     Comments: Incision line well-approximated to anterior  left hip, healed.  Skin:    General: Skin is warm and dry.  Neurological:     Mental Status: She is alert and oriented to person, place, and time.  Psychiatric:        Attention and Perception: Attention normal.        Mood and Affect: Mood normal.        Behavior: Behavior normal. Behavior is cooperative.        Thought Content: Thought content normal.        Judgment: Judgment normal.     Results for orders placed or performed during the hospital encounter of 06/15/19  SARS CORONAVIRUS 2 Nasal Swab Aptima Multi Swab   Specimen: Aptima Multi Swab; Nasal Swab  Result Value Ref Range   SARS Coronavirus 2 NEGATIVE NEGATIVE      Assessment & Plan:   Problem List Items Addressed This Visit      Musculoskeletal and Integument    Primary osteoarthritis of left hip    Status post left hip arthroplasty and reports 100% improvement in mobility and pain level.  No further pain issues at this time.  Recommend continuing PT exercises at home and will continue to monitor.       Other Visit Diagnoses    Need for influenza vaccination    -  Primary   Relevant Orders   Flu Vaccine QUAD High Dose(Fluad) (Completed)       Follow up plan: Return in about 6 months (around 01/29/2020) for HLD.

## 2019-07-31 NOTE — Patient Instructions (Signed)

## 2019-07-31 NOTE — Assessment & Plan Note (Signed)
Status post left hip arthroplasty and reports 100% improvement in mobility and pain level.  No further pain issues at this time.  Recommend continuing PT exercises at home and will continue to monitor.

## 2019-08-01 DIAGNOSIS — Z96642 Presence of left artificial hip joint: Secondary | ICD-10-CM | POA: Diagnosis not present

## 2019-08-01 DIAGNOSIS — H40053 Ocular hypertension, bilateral: Secondary | ICD-10-CM | POA: Diagnosis not present

## 2019-08-20 ENCOUNTER — Other Ambulatory Visit: Payer: Self-pay

## 2019-08-20 ENCOUNTER — Ambulatory Visit (INDEPENDENT_AMBULATORY_CARE_PROVIDER_SITE_OTHER): Payer: Medicare Other

## 2019-08-20 VITALS — BP 106/60 | HR 72 | Temp 98.5°F | Resp 16 | Ht 61.4 in | Wt 165.3 lb

## 2019-08-20 DIAGNOSIS — Z0000 Encounter for general adult medical examination without abnormal findings: Secondary | ICD-10-CM | POA: Diagnosis not present

## 2019-08-20 NOTE — Patient Instructions (Signed)
[name] , Thank you for taking time to come for your Medicare Wellness Visit. I appreciate your ongoing commitment to your health goals. Please review the following plan we discussed and let me know if I can assist you in the future.   Screening recommendations/referrals: Colonoscopy: due 05/2020 Mammogram: scheduled 08/30/2019 Bone Density: up to date Recommended yearly ophthalmology/optometry visit for glaucoma screening and checkup Recommended yearly dental visit for hygiene and checkup  Vaccinations: Influenza vaccine: up to date Pneumococcal vaccine: up to date Tdap vaccine: due now, please check with your insurance for coverage Shingles vaccine: shingrix eligible     Advanced directives: Please bring a copy of your health care power of attorney and living will to the office at your convenience.  Conditions/risks identified: continue exercising and stretching to help with post-surgery.   Next appointment: follow up in one year for your annual wellness visit    Preventive Care 65 Years and Older, Female Preventive care refers to lifestyle choices and visits with your health care provider that can promote health and wellness. What does preventive care include?  A yearly physical exam. This is also called an annual well check.  Dental exams once or twice a year.  Routine eye exams. Ask your health care provider how often you should have your eyes checked.  Personal lifestyle choices, including:  Daily care of your teeth and gums.  Regular physical activity.  Eating a healthy diet.  Avoiding tobacco and drug use.  Limiting alcohol use.  Practicing safe sex.  Taking low-dose aspirin every day.  Taking vitamin and mineral supplements as recommended by your health care provider. What happens during an annual well check? The services and screenings done by your health care provider during your annual well check will depend on your age, overall health, lifestyle risk  factors, and family history of disease. Counseling  Your health care provider may ask you questions about your:  Alcohol use.  Tobacco use.  Drug use.  Emotional well-being.  Home and relationship well-being.  Sexual activity.  Eating habits.  History of falls.  Memory and ability to understand (cognition).  Work and work Statistician.  Reproductive health. Screening  You may have the following tests or measurements:  Height, weight, and BMI.  Blood pressure.  Lipid and cholesterol levels. These may be checked every 5 years, or more frequently if you are over 92 years old.  Skin check.  Lung cancer screening. You may have this screening every year starting at age 68 if you have a 30-pack-year history of smoking and currently smoke or have quit within the past 15 years.  Fecal occult blood test (FOBT) of the stool. You may have this test every year starting at age 34.  Flexible sigmoidoscopy or colonoscopy. You may have a sigmoidoscopy every 5 years or a colonoscopy every 10 years starting at age 57.  Hepatitis C blood test.  Hepatitis B blood test.  Sexually transmitted disease (STD) testing.  Diabetes screening. This is done by checking your blood sugar (glucose) after you have not eaten for a while (fasting). You may have this done every 1-3 years.  Bone density scan. This is done to screen for osteoporosis. You may have this done starting at age 39.  Mammogram. This may be done every 1-2 years. Talk to your health care provider about how often you should have regular mammograms. Talk with your health care provider about your test results, treatment options, and if necessary, the need for more tests.  Vaccines  Your health care provider may recommend certain vaccines, such as:  Influenza vaccine. This is recommended every year.  Tetanus, diphtheria, and acellular pertussis (Tdap, Td) vaccine. You may need a Td booster every 10 years.  Zoster vaccine. You  may need this after age 57.  Pneumococcal 13-valent conjugate (PCV13) vaccine. One dose is recommended after age 95.  Pneumococcal polysaccharide (PPSV23) vaccine. One dose is recommended after age 60. Talk to your health care provider about which screenings and vaccines you need and how often you need them. This information is not intended to replace advice given to you by your health care provider. Make sure you discuss any questions you have with your health care provider. Document Released: 11/07/2015 Document Revised: 06/30/2016 Document Reviewed: 08/12/2015 Elsevier Interactive Patient Education  2017 [place] Prevention in the Home Falls can cause injuries. They can happen to people of all ages. There are many things you can do to make your home safe and to help prevent falls. What can I do on the outside of my home?  Regularly fix the edges of walkways and driveways and fix any cracks.  Remove anything that might make you trip as you walk through a door, such as a raised step or threshold.  Trim any bushes or trees on the path to your home.  Use bright outdoor lighting.  Clear any walking paths of anything that might make someone trip, such as rocks or tools.  Regularly check to see if handrails are loose or broken. Make sure that both sides of any steps have handrails.  Any raised decks and porches should have guardrails on the edges.  Have any leaves, snow, or ice cleared regularly.  Use sand or salt on walking paths during winter.  Clean up any spills in your garage right away. This includes oil or grease spills. What can I do in the bathroom?  Use night lights.  Install grab bars by the toilet and in the tub and shower. Do not use towel bars as grab bars.  Use non-skid mats or decals in the tub or shower.  If you need to sit down in the shower, use a plastic, non-slip stool.  Keep the floor dry. Clean up any water that spills on the floor as soon as  it happens.  Remove soap buildup in the tub or shower regularly.  Attach bath mats securely with double-sided non-slip rug tape.  Do not have throw rugs and other things on the floor that can make you trip. What can I do in the bedroom?  Use night lights.  Make sure that you have a light by your bed that is easy to reach.  Do not use any sheets or blankets that are too big for your bed. They should not hang down onto the floor.  Have a firm chair that has side arms. You can use this for support while you get dressed.  Do not have throw rugs and other things on the floor that can make you trip. What can I do in the kitchen?  Clean up any spills right away.  Avoid walking on wet floors.  Keep items that you use a lot in easy-to-reach places.  If you need to reach something above you, use a strong step stool that has a grab bar.  Keep electrical cords out of the way.  Do not use floor polish or wax that makes floors slippery. If you must use wax, use non-skid floor wax.  Do not have throw rugs and other things on the floor that can make you trip. What can I do with my stairs?  Do not leave any items on the stairs.  Make sure that there are handrails on both sides of the stairs and use them. Fix handrails that are broken or loose. Make sure that handrails are as long as the stairways.  Check any carpeting to make sure that it is firmly attached to the stairs. Fix any carpet that is loose or worn.  Avoid having throw rugs at the top or bottom of the stairs. If you do have throw rugs, attach them to the floor with carpet tape.  Make sure that you have a light switch at the top of the stairs and the bottom of the stairs. If you do not have them, ask someone to add them for you. What else can I do to help prevent falls?  Wear shoes that:  Do not have high heels.  Have rubber bottoms.  Are comfortable and fit you well.  Are closed at the toe. Do not wear sandals.  If  you use a stepladder:  Make sure that it is fully opened. Do not climb a closed stepladder.  Make sure that both sides of the stepladder are locked into place.  Ask someone to hold it for you, if possible.  Clearly mark and make sure that you can see:  Any grab bars or handrails.  First and last steps.  Where the edge of each step is.  Use tools that help you move around (mobility aids) if they are needed. These include:  Canes.  Walkers.  Scooters.  Crutches.  Turn on the lights when you go into a dark area. Replace any light bulbs as soon as they burn out.  Set up your furniture so you have a clear path. Avoid moving your furniture around.  If any of your floors are uneven, fix them.  If there are any pets around you, be aware of where they are.  Review your medicines with your doctor. Some medicines can make you feel dizzy. This can increase your chance of falling. Ask your doctor what other things that you can do to help prevent falls. This information is not intended to replace advice given to you by your health care provider. Make sure you discuss any questions you have with your health care provider. Document Released: 08/07/2009 Document Revised: 03/18/2016 Document Reviewed: 11/15/2014 Elsevier Interactive Patient Education  2017 [employer].

## 2019-08-20 NOTE — Progress Notes (Signed)
Subjective:   Breanna Davidson is a 68 y.o. female who presents for Medicare Annual (Subsequent) preventive examination.  Review of Systems:   Cardiac Risk Factors include: advanced age (>56men, >38 women);dyslipidemia;hypertension;obesity (BMI >30kg/m2)     Objective:     Vitals: BP 106/60 (BP Location: Left Arm, Patient Position: Sitting, Cuff Size: Normal)   Pulse 72   Temp 98.5 F (36.9 C) (Temporal)   Resp 16   Ht 5' 1.4" (1.56 m)   Wt 165 lb 4.8 oz (75 kg)   LMP  (LMP Unknown)   BMI 30.83 kg/m   Body mass index is 30.83 kg/m.  Advanced Directives 08/20/2019 06/19/2019 06/12/2019 06/23/2018 06/23/2017 07/23/2016  Does Patient Have a Medical Advance Directive? Yes Yes Yes Yes Yes Yes  Type of Advance Directive Living will;Healthcare Power of Attorney Living will Living will [place];Living will [place];Living will [place];Living will  Does patient want to make changes to medical advance directive? - No - Patient declined No - Patient declined - - -  Copy of Healthcare Power of Attorney in Chart? - - - No - copy requested No - copy requested -    Tobacco Social History   Tobacco Use  Smoking Status Never Smoker  Smokeless Tobacco Never Used     Counseling given: Not Answered   Clinical Intake:  Pre-visit preparation completed: Yes  Pain : 0-10 Pain Score: 3  Pain Type: Acute pain Pain Location: Head Pain Orientation: Anterior Pain Descriptors / Indicators: Aching Pain Onset: Today Pain Frequency: Occasional     Nutritional Status: BMI > 30  Obese Diabetes: No     Interpreter Needed?: No  Information entered by :: Lavra Imler,LPN  Past Medical History:  Diagnosis Date  . Allergy   . Chest pain    2017 ED VISIT FOR CHEST PAIN , R/O AS GASTRIC CAUSE   . Hyperlipidemia   . Increased pressure in the eye    "THEY DID SURGERY ON MY EYES TO REDUCE THE PRESSURE , THEY MADE TWO HOLES IN MY  EYES "  . Osteopenia   . Spinal stenosis    LUMBAR   . Vertigo    IMPROVED BUT HAS OCCASIONAL SX   Past Surgical History:  Procedure Laterality Date  . APPENDECTOMY    . CESAREAN SECTION    . DILATION AND CURETTAGE OF UTERUS    . EYE SURGERY     "THEY DID SURGERY ON MY EYES TO REDUCE THE PRESSURE , THEY MADE TWO HOLES IN MY EYES "  . REDUCTION MAMMAPLASTY  1988  . 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 Breast cancer Mother 36       and again at 50  . Lung disease Mother   . Cancer Mother   . Emphysema Mother   . Breast cancer Maternal Grandmother   . CAD Father   . Hyperlipidemia Sister   . Mental illness Daughter        bipolar  . Cystic fibrosis Daughter   . Breast cancer Maternal Aunt    Social History   Socioeconomic History  . Marital status: Married    Spouse name: Not on file  . Number of children: Not on file  . Years of education: college   .  Highest education level: Not on file  Occupational History  . Occupation: retired   Scientific laboratory technician  . Financial resource strain: Not hard at all  . Food insecurity    Worry: Never true    Inability: Never true  . Transportation needs    Medical: No    Non-medical: No  Tobacco Use  . Smoking status: Never Smoker  . Smokeless tobacco: Never Used  Substance and Sexual Activity  . Alcohol use: Not Currently    Alcohol/week: 1.0 standard drinks    Types: 1 Shots of liquor per week  . Drug use: No  . Sexual activity: Yes  Lifestyle  . Physical activity    Days per week: 3 days    Minutes per session: 20 min  . Stress: Not at all  Relationships  . Social connections    Talks on phone: More than three times a week    Gets together: More than three times a week    Attends religious service: Never    Active member of  club or organization: Yes    Attends meetings of clubs or organizations: More than 4 times per year    Relationship status: Married  Other Topics Concern  . Not on file  Social History Narrative  . Not on file    Outpatient Encounter Medications as of 08/20/2019  Medication Sig  . Acetaminophen (TYLENOL PO) Take by mouth.  . Calcium-Magnesium-Vitamin D (CALCIUM MAGNESIUM PO) Take 1 tablet by mouth daily.   . Cholecalciferol (VITAMIN D) 50 MCG (2000 UT) CAPS Take 2,000 Units by mouth daily.   . Coenzyme Q10 (CO Q 10 PO) Take 30 mg by mouth daily.   . COMBIGAN 0.2-0.5 % ophthalmic solution Apply 1 drop to eye 2 (two) times daily.  . DULoxetine (CYMBALTA) 30 MG capsule Take 1 capsule (30 mg total) by mouth daily.  . Emollient (COLLAGEN EX) Apply topically.  . Omega-3 Fatty Acids (FISH OIL) 1200 MG CAPS Take 1,200 mg by mouth daily.   . Red Yeast Rice 600 MG TABS Take 600 mg by mouth daily.   . [DISCONTINUED] aspirin EC 81 MG tablet Take 1 tablet (81 mg total) by mouth 2 (two) times daily. For DVT prophylaxis for 30 days after surgery. (Patient not taking: Reported on 07/31/2019)  . [DISCONTINUED] docusate sodium (COLACE) 100 MG capsule Take 1 capsule (100 mg total) by mouth 2 (two) times daily. To prevent constipation while taking pain medication. (Patient not taking: Reported on 07/31/2019)  . [DISCONTINUED] gabapentin (NEURONTIN) 100 MG capsule Take 1 capsule (100 mg total) by mouth 2 (two) times daily as needed for up to 10 days. For pain.  . [DISCONTINUED] HYDROcodone-acetaminophen (NORCO) 5-325 MG tablet Take 1-2 tablets by mouth every 6 (six) hours as needed for severe pain. (Patient not taking: Reported on 07/31/2019)  . [DISCONTINUED] meloxicam (MOBIC) 7.5 MG tablet Take 7.5 mg by mouth daily.  . [DISCONTINUED] methocarbamol (ROBAXIN) 500 MG tablet Take 1 tablet (500 mg total) by mouth every 8 (eight) hours as needed for muscle spasms. (Patient not taking: Reported on 07/31/2019)  .  [DISCONTINUED] omeprazole (PRILOSEC) 20 MG capsule Take 1 capsule (20 mg total) by mouth daily. 30 days for gastroprotection while taking NSAIDs. (Patient not taking: Reported on 07/31/2019)  . [DISCONTINUED] ondansetron (ZOFRAN) 4 MG tablet Take 1 tablet (4 mg total) by mouth every 8 (eight) hours as needed for nausea or vomiting. (Patient not taking: Reported on 07/31/2019)   No facility-administered encounter medications on  file as of 08/20/2019.     Activities of Daily Living In your present state of health, do you have any difficulty performing the following activities: 08/20/2019 06/19/2019  Hearing? N N  Comment no hearing aids -  Vision? Y N  Comment contacts and glasses, goes to [facility] -  Difficulty concentrating or making decisions? N N  Walking or climbing stairs? N N  Dressing or bathing? N N  Doing errands, shopping? N N  Preparing Food and eating ? N -  Using the Toilet? N -  In the past six months, have you accidently leaked urine? N -  Do you have problems with loss of bowel control? N -  Managing your Medications? N -  Managing your Finances? N -  Housekeeping or managing your Housekeeping? N -  Some recent data might be hidden    Patient Care Team: Venita Lick, NP as PCP - General (Nurse Practitioner)    Assessment:   This is a routine wellness examination for [place].  Exercise Activities and Dietary recommendations Current Exercise Habits: Home exercise routine, Type of exercise: walking, Time (Minutes): 30, Frequency (Times/Week): 3, Weekly Exercise (Minutes/Week): 90, Intensity: Mild, Exercise limited by: orthopedic condition(s)  Goals    . DIET - INCREASE WATER INTAKE     Recommend drinking at least 6-8 glasses of water a day        Fall Risk: Fall Risk  08/20/2019 02/13/2019 06/23/2018 06/23/2017 07/23/2016  Falls in the past year? 1 0 Yes No No  Number falls in past yr: 1 0 1 - -  Injury with Fall? 0 0 No - -    FALL RISK  PREVENTION PERTAINING TO THE HOME:  Any stairs in or around the home? Yes  steps going inside  If so, are there any without handrails? No   Home free of loose throw rugs in walkways, pet beds, electrical cords, etc? Yes  Adequate lighting in your home to reduce risk of falls? Yes   ASSISTIVE DEVICES UTILIZED TO PREVENT FALLS:  Life alert? No  Use of a cane, walker or w/c? No  Grab bars in the bathroom? No  Shower chair or bench in shower? No  Elevated toilet seat or a handicapped toilet? No   DME ORDERS:  DME order needed?  No   TIMED UP AND GO:  Was the test performed? Yes .  Length of time to ambulate 10 feet: 8 sec.   GAIT:  Appearance of gait: Gait steady and fast without the use of an assistive device.  Education: Fall risk prevention has been discussed.  Intervention(s) required? Yes   DME/home health order needed?  Yes    Depression Screen PHQ 2/9 Scores 08/20/2019 05/17/2019 02/13/2019 12/13/2018  PHQ - 2 Score 0 0 0 4  PHQ- 9 Score - 0 2 5     Cognitive Function     6CIT Screen 08/20/2019 06/23/2018 06/23/2017  What Year? 0 points 0 points 0 points  What month? 0 points 0 points 0 points  What time? 0 points 0 points 0 points  Count back from 20 0 points 0 points 0 points  Months in reverse 0 points 0 points 0 points  Repeat phrase 0 points 0 points 0 points  Total Score 0 0 0    Immunization History  Administered Date(s) Administered  . Fluad Quad(high Dose 65+) 07/31/2019  . Influenza, High Dose Seasonal PF 07/23/2016, 07/26/2017, 08/01/2018  . Pneumococcal Conjugate-13 07/23/2016  .  Pneumococcal Polysaccharide-23 07/26/2017  . Td 11/13/2004  . Zoster 09/12/2015    Qualifies for Shingles Vaccine? Yes  Zostavax completed 2016. Due for Shingrix. Education has been provided regarding the importance of this vaccine. Pt has been advised to call insurance company to determine out of pocket expense. Advised may also receive vaccine at local pharmacy or  Health Dept. Verbalized acceptance and understanding.  Tdap: Discussed need for TD/TDAP vaccine, patient verbalized understanding that this is not covered as a preventative with there insurance and to call the office if she develops any new skin injuries, ie: cuts, scrapes, bug bites, or open wounds.  Flu Vaccine: up to date   Pneumococcal Vaccine: up to date   Screening Tests Health Maintenance  Topic Date Due  . TETANUS/TDAP  07/30/2020 (Originally 11/13/2014)  . COLONOSCOPY  06/04/2020  . MAMMOGRAM  07/10/2020  . INFLUENZA VACCINE  Completed  . DEXA SCAN  Completed  . Hepatitis C Screening  Completed  . PNA vac Low Risk Adult  Completed    Cancer Screenings:  Colorectal Screening: Completed 05/29/2010. Repeat every 10 years  Mammogram: scheduled 08/30/2019  Bone Density: Completed 2017.   Lung Cancer Screening: (Low Dose CT Chest recommended if Age 65-80 years, 30 pack-year currently smoking OR have quit w/in 15years.) does not qualify.    Additional Screening:  Hepatitis C Screening: does qualify; Completed 2017  Vision Screening: Recommended annual ophthalmology exams for early detection of glaucoma and other disorders of the eye. Is the patient up to date with their annual eye exam?  Yes  Who is the provider or what is the name of the office in which the pt attends annual eye exams? [facility]    Dental Screening: Recommended annual dental exams for proper oral hygiene  Community Resource Referral:  CRR required this visit?  No       Plan:  I have personally reviewed and addressed the Medicare Annual Wellness questionnaire and have noted the following in the patient's chart:  A. Medical and social history B. Use of alcohol, tobacco or illicit drugs  C. Current medications and supplements D. Functional ability and status E.  Nutritional status F.  Physical activity G. Advance directives H. List of other physicians I.  Hospitalizations, surgeries, and  ER visits in previous 12 months J.  [place] such as hearing and vision if needed, cognitive and depression L. Referrals and appointments   In addition, I have reviewed and discussed with patient certain preventive protocols, quality metrics, and best practice recommendations. A written personalized care plan for preventive services as well as general preventive health recommendations were provided to patient.  Signed,    Bevelyn Ngo, LPN  [id number] Nurse Health Advisor   Nurse Notes: none

## 2019-08-30 ENCOUNTER — Ambulatory Visit
Admission: RE | Admit: 2019-08-30 | Discharge: 2019-08-30 | Disposition: A | Discharge status: Home / Self Care | Payer: Medicare Other | Source: Ambulatory Visit | Attending: Nurse Practitioner | Admitting: Nurse Practitioner

## 2019-08-30 DIAGNOSIS — Z1231 Encounter for screening mammogram for malignant neoplasm of breast: Secondary | ICD-10-CM | POA: Insufficient documentation

## 2019-10-17 DIAGNOSIS — M545 Low back pain: Secondary | ICD-10-CM | POA: Diagnosis not present

## 2019-10-31 DIAGNOSIS — M545 Low back pain: Secondary | ICD-10-CM | POA: Diagnosis not present

## 2019-11-02 DIAGNOSIS — M5116 Intervertebral disc disorders with radiculopathy, lumbar region: Secondary | ICD-10-CM | POA: Diagnosis not present

## 2019-11-02 DIAGNOSIS — M5416 Radiculopathy, lumbar region: Secondary | ICD-10-CM | POA: Diagnosis not present

## 2019-11-02 DIAGNOSIS — M545 Low back pain: Secondary | ICD-10-CM | POA: Diagnosis not present

## 2019-11-05 DIAGNOSIS — M5416 Radiculopathy, lumbar region: Secondary | ICD-10-CM | POA: Diagnosis not present

## 2019-11-05 DIAGNOSIS — M5116 Intervertebral disc disorders with radiculopathy, lumbar region: Secondary | ICD-10-CM | POA: Diagnosis not present

## 2019-11-05 DIAGNOSIS — M545 Low back pain: Secondary | ICD-10-CM | POA: Diagnosis not present

## 2019-11-12 DIAGNOSIS — M5116 Intervertebral disc disorders with radiculopathy, lumbar region: Secondary | ICD-10-CM | POA: Diagnosis not present

## 2019-11-12 DIAGNOSIS — M5416 Radiculopathy, lumbar region: Secondary | ICD-10-CM | POA: Diagnosis not present

## 2019-11-12 DIAGNOSIS — M545 Low back pain: Secondary | ICD-10-CM | POA: Diagnosis not present

## 2019-11-14 ENCOUNTER — Ambulatory Visit: Payer: Medicare Other | Attending: Internal Medicine

## 2019-11-14 DIAGNOSIS — Z23 Encounter for immunization: Secondary | ICD-10-CM

## 2019-11-14 NOTE — Progress Notes (Signed)
   Covid-19 Vaccination Clinic  Name:  Breanna Davidson    MRN: [id number] DOB: November 28, 1950  11/14/2019  [name] was observed post Covid-19 immunization for 15 minutes without incidence. She was provided with Vaccine Information Sheet and instruction to access the V-Safe system.   [name] was instructed to call 911 with any severe reactions post vaccine: Marland Kitchen Difficulty breathing  . Swelling of your face and throat  . A fast heartbeat  . A bad rash all over your body  . Dizziness and weakness    Immunizations Administered    Name Date Dose VIS Date Route   Pfizer COVID-19 Vaccine 11/14/2019  8:31 AM 0.3 mL 10/05/2019 Intramuscular   Manufacturer: [place]   Lot: [id number]   [place]: [id number]

## 2019-11-19 DIAGNOSIS — R030 Elevated blood-pressure reading, without diagnosis of hypertension: Secondary | ICD-10-CM | POA: Diagnosis not present

## 2019-11-19 DIAGNOSIS — M5116 Intervertebral disc disorders with radiculopathy, lumbar region: Secondary | ICD-10-CM | POA: Diagnosis not present

## 2019-11-19 DIAGNOSIS — M48061 Spinal stenosis, lumbar region without neurogenic claudication: Secondary | ICD-10-CM | POA: Diagnosis not present

## 2019-11-19 DIAGNOSIS — M5416 Radiculopathy, lumbar region: Secondary | ICD-10-CM | POA: Diagnosis not present

## 2019-11-19 DIAGNOSIS — M545 Low back pain: Secondary | ICD-10-CM | POA: Diagnosis not present

## 2019-11-26 HISTORY — PX: BACK SURGERY: SHX140

## 2019-11-28 DIAGNOSIS — M48061 Spinal stenosis, lumbar region without neurogenic claudication: Secondary | ICD-10-CM | POA: Diagnosis not present

## 2019-11-28 DIAGNOSIS — M5126 Other intervertebral disc displacement, lumbar region: Secondary | ICD-10-CM | POA: Diagnosis not present

## 2019-11-29 DIAGNOSIS — M4316 Spondylolisthesis, lumbar region: Secondary | ICD-10-CM | POA: Diagnosis not present

## 2019-11-29 DIAGNOSIS — M48061 Spinal stenosis, lumbar region without neurogenic claudication: Secondary | ICD-10-CM | POA: Diagnosis not present

## 2019-11-29 DIAGNOSIS — M5126 Other intervertebral disc displacement, lumbar region: Secondary | ICD-10-CM | POA: Diagnosis not present

## 2019-11-30 ENCOUNTER — Other Ambulatory Visit: Payer: Self-pay | Admitting: Neurosurgery

## 2019-12-02 ENCOUNTER — Ambulatory Visit: Payer: Medicare Other | Attending: Internal Medicine

## 2019-12-02 DIAGNOSIS — Z23 Encounter for immunization: Secondary | ICD-10-CM | POA: Insufficient documentation

## 2019-12-02 NOTE — Progress Notes (Signed)
   Covid-19 Vaccination Clinic  Name:  Breanna Davidson    MRN: [id number] DOB: 1951/07/16  12/02/2019  [name] was observed post Covid-19 immunization for 15 minutes without incidence. She was provided with Vaccine Information Sheet and instruction to access the V-Safe system.   [name] was instructed to call 911 with any severe reactions post vaccine: Marland Kitchen Difficulty breathing  . Swelling of your face and throat  . A fast heartbeat  . A bad rash all over your body  . Dizziness and weakness    Immunizations Administered    Name Date Dose VIS Date Route   Pfizer COVID-19 Vaccine 12/02/2019  8:15 AM 0.3 mL 10/05/2019 Intramuscular   Manufacturer: [place]   Lot: [id number]   [place]: [id number]

## 2019-12-07 NOTE — Progress Notes (Signed)
[place], [place] - [place] [address] Phone: [phone] Fax: [phone]  [place], [place] - [place] [address] Phone: [phone] Fax: [phone]      Your procedure is scheduled on Wednesday Dec 12, 2019.  Report to [facility] Main Entrance "A" at 07:00 A.M., and check in at the Admitting office.  Call this number if you have problems the morning of surgery:  [phone]  Call [phone] if you have any questions prior to your surgery date Monday-Friday 8am-4pm    Remember:  Do not eat or drink after midnight the night before your surgery   Take these medicines the morning of surgery with A SIP OF WATER: DULoxetine (CYMBALTA) Eye drops  7 days prior to surgery STOP taking any Aspirin (unless otherwise instructed by your surgeon), Aleve, Naproxen, Ibuprofen, Motrin, Advil, Goody's, BC's, all herbal medications, fish oil, and all vitamins.    The Morning of Surgery  Do not wear jewelry, make-up or nail polish.  Do not wear lotions, powders, perfumes, or deodorant  Do not shave 48 hours prior to surgery.    Do not bring valuables to the hospital.  [facility] is not responsible for any belongings or valuables.  If you are a smoker, DO NOT Smoke 24 hours prior to surgery  If you wear a CPAP at night please bring your mask the morning of surgery   Remember that you must have someone to transport you home after your surgery, and remain with you for 24 hours if you are discharged the same day.   Please bring cases for contacts, glasses, hearing aids, dentures or bridgework because it cannot be worn into surgery.    Leave your suitcase in the car.  After surgery it may be brought to your room.  For patients admitted to the hospital, discharge time will be determined by your treatment team.  Patients discharged the day of surgery will not be allowed to  drive home.    Special instructions:   [place]- Preparing For Surgery  Before surgery, you can play an important role. Because skin is not sterile, your skin needs to be as free of germs as possible. You can reduce the number of germs on your skin by washing with CHG (chlorahexidine gluconate) Soap before surgery.  CHG is an antiseptic cleaner which kills germs and bonds with the skin to continue killing germs even after washing.    Oral Hygiene is also important to reduce your risk of infection.  Remember - BRUSH YOUR TEETH THE MORNING OF SURGERY WITH YOUR REGULAR TOOTHPASTE  Please do not use if you have an allergy to CHG or antibacterial soaps. If your skin becomes reddened/irritated stop using the CHG.  Do not shave (including legs and underarms) for at least 48 hours prior to first CHG shower. It is OK to shave your face.  Please follow these instructions carefully.   1. Shower the NIGHT BEFORE SURGERY and the MORNING OF SURGERY with CHG Soap.   2. If you chose to wash your hair, wash your hair first as usual with your normal shampoo.  3. After you shampoo, rinse your hair and body thoroughly to remove the shampoo.  4. Use CHG as you would any other liquid soap. You can apply CHG directly to the skin and wash gently with a scrungie or a clean washcloth.   5. Apply the CHG  Soap to your body ONLY FROM THE NECK DOWN.  Do not use on open wounds or open sores. Avoid contact with your eyes, ears, mouth and genitals (private parts). Wash Face and genitals (private parts)  with your normal soap.   6. Wash thoroughly, paying special attention to the area where your surgery will be performed.  7. Thoroughly rinse your body with warm water from the neck down.  8. DO NOT shower/wash with your normal soap after using and rinsing off the CHG Soap.  9. Pat yourself dry with a CLEAN TOWEL.  10. Wear CLEAN PAJAMAS to bed the night before surgery, wear comfortable clothes the morning of  surgery  11. Place CLEAN SHEETS on your bed the night of your first shower and DO NOT SLEEP WITH PETS.    Day of Surgery:  Please shower the morning of surgery with the CHG soap Do not apply any deodorants/lotions. Please wear clean clothes to the hospital/surgery center.   Remember to brush your teeth WITH YOUR REGULAR TOOTHPASTE.   Please read over the following fact sheets that you were given.

## 2019-12-10 ENCOUNTER — Other Ambulatory Visit: Payer: Self-pay

## 2019-12-10 ENCOUNTER — Other Ambulatory Visit (HOSPITAL_COMMUNITY)
Admission: RE | Admit: 2019-12-10 | Discharge: 2019-12-10 | Disposition: A | Discharge status: Home / Self Care | Payer: Medicare Other | Source: Ambulatory Visit | Attending: Neurosurgery | Admitting: Neurosurgery

## 2019-12-10 ENCOUNTER — Encounter (HOSPITAL_COMMUNITY): Payer: Self-pay

## 2019-12-10 DIAGNOSIS — Z818 Family history of other mental and behavioral disorders: Secondary | ICD-10-CM | POA: Diagnosis not present

## 2019-12-10 DIAGNOSIS — M1990 Unspecified osteoarthritis, unspecified site: Secondary | ICD-10-CM | POA: Diagnosis not present

## 2019-12-10 DIAGNOSIS — E785 Hyperlipidemia, unspecified: Secondary | ICD-10-CM | POA: Diagnosis not present

## 2019-12-10 DIAGNOSIS — M4316 Spondylolisthesis, lumbar region: Secondary | ICD-10-CM | POA: Diagnosis not present

## 2019-12-10 DIAGNOSIS — Z836 Family history of other diseases of the respiratory system: Secondary | ICD-10-CM | POA: Diagnosis not present

## 2019-12-10 DIAGNOSIS — Z01812 Encounter for preprocedural laboratory examination: Secondary | ICD-10-CM | POA: Insufficient documentation

## 2019-12-10 DIAGNOSIS — Z803 Family history of malignant neoplasm of breast: Secondary | ICD-10-CM | POA: Diagnosis not present

## 2019-12-10 DIAGNOSIS — R519 Headache, unspecified: Secondary | ICD-10-CM | POA: Diagnosis not present

## 2019-12-10 DIAGNOSIS — R42 Dizziness and giddiness: Secondary | ICD-10-CM | POA: Diagnosis not present

## 2019-12-10 DIAGNOSIS — Z79899 Other long term (current) drug therapy: Secondary | ICD-10-CM | POA: Diagnosis not present

## 2019-12-10 DIAGNOSIS — M48062 Spinal stenosis, lumbar region with neurogenic claudication: Secondary | ICD-10-CM | POA: Diagnosis not present

## 2019-12-10 DIAGNOSIS — Z825 Family history of asthma and other chronic lower respiratory diseases: Secondary | ICD-10-CM | POA: Diagnosis not present

## 2019-12-10 DIAGNOSIS — M8580 Other specified disorders of bone density and structure, unspecified site: Secondary | ICD-10-CM | POA: Diagnosis not present

## 2019-12-10 DIAGNOSIS — Z20822 Contact with and (suspected) exposure to covid-19: Secondary | ICD-10-CM | POA: Diagnosis not present

## 2019-12-10 DIAGNOSIS — Z888 Allergy status to other drugs, medicaments and biological substances status: Secondary | ICD-10-CM | POA: Diagnosis not present

## 2019-12-10 DIAGNOSIS — F329 Major depressive disorder, single episode, unspecified: Secondary | ICD-10-CM | POA: Diagnosis not present

## 2019-12-10 DIAGNOSIS — Z96642 Presence of left artificial hip joint: Secondary | ICD-10-CM | POA: Diagnosis not present

## 2019-12-10 DIAGNOSIS — M5126 Other intervertebral disc displacement, lumbar region: Secondary | ICD-10-CM | POA: Diagnosis not present

## 2019-12-10 HISTORY — DX: Headache, unspecified: R51.9

## 2019-12-10 HISTORY — DX: Depression, unspecified: F32.A

## 2019-12-10 LAB — ABO/RH: ABO/RH(D): A POS

## 2019-12-10 LAB — CBC
HCT: 40.8 % (ref 36.0–46.0)
Hemoglobin: 12.8 g/dL (ref 12.0–15.0)
MCH: 29.5 pg (ref 26.0–34.0)
MCHC: 31.4 g/dL (ref 30.0–36.0)
MCV: 94 fL (ref 80.0–100.0)
Platelets: 296 10*3/uL (ref 150–400)
RBC: 4.34 MIL/uL (ref 3.87–5.11)
RDW: 14.3 % (ref 11.5–15.5)
WBC: 6.5 10*3/uL (ref 4.0–10.5)
nRBC: 0 % (ref 0.0–0.2)

## 2019-12-10 LAB — TYPE AND SCREEN
ABO/RH(D): A POS
Antibody Screen: NEGATIVE

## 2019-12-10 LAB — BASIC METABOLIC PANEL
Anion gap: 10 (ref 5–15)
BUN: 21 mg/dL (ref 8–23)
CO2: 24 mmol/L (ref 22–32)
Calcium: 9.2 mg/dL (ref 8.9–10.3)
Chloride: 108 mmol/L (ref 98–111)
Creatinine, Ser: 0.79 mg/dL (ref 0.44–1.00)
GFR calc Af Amer: >60 mL/min (ref >60)
GFR calc non Af Amer: >60 mL/min (ref >60)
Glucose, Bld: 71 mg/dL (ref 70–99)
Potassium: 4.1 mmol/L (ref 3.5–5.1)
Sodium: 142 mmol/L (ref 135–145)

## 2019-12-10 LAB — SARS CORONAVIRUS 2 (TAT 6-24 HRS): SARS Coronavirus 2: NEGATIVE

## 2019-12-10 LAB — SURGICAL PCR SCREEN
MRSA, PCR: NEGATIVE
Staphylococcus aureus: NEGATIVE

## 2019-12-10 NOTE — Progress Notes (Signed)
PCP - Marnee Guarneri, NP Cardiologist - denies  PPM/ICD - denies  Chest x-ray - N/A EKG - N/A Stress Test - denies ECHO - denies Cardiac Cath - denies  Sleep Study - denies CPAP - N/A  Blood Thinner Instructions: N/A Aspirin Instructions: N/A  ERAS Protcol - No  COVID TEST- Scheduled for today after PAT appointment 12/10/2019. Patient verbalized understanding of self quarantine instructions, appointment time and place.  Anesthesia review: No  Patient denies shortness of breath, fever, cough and chest pain at PAT appointment  All instructions explained to the patient, with a verbal understanding of the material. Patient agrees to go over the instructions while at home for a better understanding. Patient also instructed to self quarantine after being tested for COVID-19. The opportunity to ask questions was provided.

## 2019-12-11 NOTE — Anesthesia Preprocedure Evaluation (Addendum)
Anesthesia Evaluation  Patient identified by MRN, date of birth, ID band Patient awake    Reviewed: Allergy & Precautions, NPO status , Patient's Chart, lab work & pertinent test results  Airway Mallampati: II  TM Distance: >3 FB Neck ROM: Full    Dental  (+) Dental Advisory Given   Pulmonary neg pulmonary ROS,    breath sounds clear to auscultation       Cardiovascular negative cardio ROS   Rhythm:Regular Rate:Normal     Neuro/Psych  Headaches,  Neuromuscular disease    GI/Hepatic negative GI ROS, Neg liver ROS,   Endo/Other  negative endocrine ROS  Renal/GU negative Renal ROS     Musculoskeletal  (+) Arthritis ,   Abdominal   Peds  Hematology negative hematology ROS (+)   Anesthesia Other Findings   Reproductive/Obstetrics                            Lab Results  Component Value Date   WBC 6.5 12/10/2019   HGB 12.8 12/10/2019   HCT 40.8 12/10/2019   MCV 94.0 12/10/2019   PLT 296 12/10/2019   Lab Results  Component Value Date   CREATININE 0.79 12/10/2019   BUN 21 12/10/2019   NA 142 12/10/2019   K 4.1 12/10/2019   CL 108 12/10/2019   CO2 24 12/10/2019  ' Anesthesia Physical Anesthesia Plan  ASA: II  Anesthesia Plan: General   Post-op Pain Management:    Induction: Intravenous  PONV Risk Score and Plan: 3 and Dexamethasone, Ondansetron, Treatment may vary due to age or medical condition and Midazolam  Airway Management Planned: Oral ETT  Additional Equipment:   Intra-op Plan:   Post-operative Plan: Extubation in OR  Informed Consent: I have reviewed the patients History and Physical, chart, labs and discussed the procedure including the risks, benefits and alternatives for the proposed anesthesia with the patient or authorized representative who has indicated his/her understanding and acceptance.     Dental advisory given  Plan Discussed with:  CRNA  Anesthesia Plan Comments:        Anesthesia Quick Evaluation

## 2019-12-12 ENCOUNTER — Encounter (HOSPITAL_COMMUNITY): Payer: Self-pay | Admitting: Neurosurgery

## 2019-12-12 ENCOUNTER — Ambulatory Visit (HOSPITAL_COMMUNITY): Payer: Medicare Other | Admitting: Vascular Surgery

## 2019-12-12 ENCOUNTER — Ambulatory Visit (HOSPITAL_COMMUNITY): Payer: Medicare Other

## 2019-12-12 ENCOUNTER — Other Ambulatory Visit: Payer: Self-pay

## 2019-12-12 ENCOUNTER — Encounter (HOSPITAL_COMMUNITY)
Admission: AD | Disposition: A | Discharge status: Home / Self Care | Payer: Self-pay | Source: Ambulatory Visit | Attending: Neurosurgery

## 2019-12-12 ENCOUNTER — Observation Stay (HOSPITAL_COMMUNITY)
Admission: AD | Admit: 2019-12-12 | Discharge: 2019-12-13 | Disposition: A | Discharge status: Home / Self Care | Payer: Medicare Other | Source: Ambulatory Visit | Attending: Neurosurgery | Admitting: Neurosurgery

## 2019-12-12 ENCOUNTER — Ambulatory Visit (HOSPITAL_COMMUNITY): Payer: Medicare Other | Admitting: Certified Registered"

## 2019-12-12 DIAGNOSIS — M48061 Spinal stenosis, lumbar region without neurogenic claudication: Secondary | ICD-10-CM | POA: Diagnosis not present

## 2019-12-12 DIAGNOSIS — M8580 Other specified disorders of bone density and structure, unspecified site: Secondary | ICD-10-CM | POA: Insufficient documentation

## 2019-12-12 DIAGNOSIS — R519 Headache, unspecified: Secondary | ICD-10-CM | POA: Diagnosis not present

## 2019-12-12 DIAGNOSIS — M48062 Spinal stenosis, lumbar region with neurogenic claudication: Secondary | ICD-10-CM | POA: Insufficient documentation

## 2019-12-12 DIAGNOSIS — E785 Hyperlipidemia, unspecified: Secondary | ICD-10-CM | POA: Insufficient documentation

## 2019-12-12 DIAGNOSIS — F329 Major depressive disorder, single episode, unspecified: Secondary | ICD-10-CM | POA: Insufficient documentation

## 2019-12-12 DIAGNOSIS — M5126 Other intervertebral disc displacement, lumbar region: Secondary | ICD-10-CM | POA: Diagnosis not present

## 2019-12-12 DIAGNOSIS — Z79899 Other long term (current) drug therapy: Secondary | ICD-10-CM | POA: Diagnosis not present

## 2019-12-12 DIAGNOSIS — M4326 Fusion of spine, lumbar region: Secondary | ICD-10-CM | POA: Diagnosis not present

## 2019-12-12 DIAGNOSIS — Z825 Family history of asthma and other chronic lower respiratory diseases: Secondary | ICD-10-CM | POA: Diagnosis not present

## 2019-12-12 DIAGNOSIS — M4316 Spondylolisthesis, lumbar region: Secondary | ICD-10-CM | POA: Diagnosis not present

## 2019-12-12 DIAGNOSIS — Z836 Family history of other diseases of the respiratory system: Secondary | ICD-10-CM | POA: Diagnosis not present

## 2019-12-12 DIAGNOSIS — R42 Dizziness and giddiness: Secondary | ICD-10-CM | POA: Diagnosis not present

## 2019-12-12 DIAGNOSIS — Z818 Family history of other mental and behavioral disorders: Secondary | ICD-10-CM | POA: Insufficient documentation

## 2019-12-12 DIAGNOSIS — Z888 Allergy status to other drugs, medicaments and biological substances status: Secondary | ICD-10-CM | POA: Insufficient documentation

## 2019-12-12 DIAGNOSIS — Z803 Family history of malignant neoplasm of breast: Secondary | ICD-10-CM | POA: Insufficient documentation

## 2019-12-12 DIAGNOSIS — Z96642 Presence of left artificial hip joint: Secondary | ICD-10-CM | POA: Diagnosis not present

## 2019-12-12 DIAGNOSIS — M1990 Unspecified osteoarthritis, unspecified site: Secondary | ICD-10-CM | POA: Diagnosis not present

## 2019-12-12 DIAGNOSIS — Z419 Encounter for procedure for purposes other than remedying health state, unspecified: Secondary | ICD-10-CM

## 2019-12-12 DIAGNOSIS — M5416 Radiculopathy, lumbar region: Secondary | ICD-10-CM | POA: Diagnosis not present

## 2019-12-12 DIAGNOSIS — E7800 Pure hypercholesterolemia, unspecified: Secondary | ICD-10-CM | POA: Diagnosis not present

## 2019-12-12 SURGERY — POSTERIOR LUMBAR FUSION 1 LEVEL
Anesthesia: General

## 2019-12-12 MED ORDER — ONDANSETRON HCL 4 MG/2ML IJ SOLN: 4.0000 mg | Freq: Once | INTRAMUSCULAR | Status: DC | PRN

## 2019-12-12 MED ORDER — ROCURONIUM BROMIDE 10 MG/ML (PF) SYRINGE
PREFILLED_SYRINGE | INTRAVENOUS | Status: AC
  Filled 2019-12-12: qty 10

## 2019-12-12 MED ORDER — ZOLPIDEM TARTRATE 5 MG PO TABS: 5.0000 mg | ORAL_TABLET | Freq: Every evening | ORAL | Status: DC | PRN

## 2019-12-12 MED ORDER — CELECOXIB 200 MG PO CAPS
200.0000 mg | ORAL_CAPSULE | Freq: Two times a day (BID) | ORAL | Status: DC
  Administered 2019-12-12 – 2019-12-13 (×2): 200 mg via ORAL
  Filled 2019-12-12 (×2): qty 1

## 2019-12-12 MED ORDER — ACETAMINOPHEN 500 MG PO TABS
ORAL_TABLET | ORAL | Status: AC
  Administered 2019-12-12: 1000 mg via ORAL
  Filled 2019-12-12: qty 2

## 2019-12-12 MED ORDER — BISACODYL 5 MG PO TBEC: 5.0000 mg | DELAYED_RELEASE_TABLET | Freq: Every day | ORAL | Status: DC | PRN

## 2019-12-12 MED ORDER — BUPIVACAINE HCL (PF) 0.5 % IJ SOLN
INTRAMUSCULAR | Status: AC
  Filled 2019-12-12: qty 30

## 2019-12-12 MED ORDER — FENTANYL CITRATE (PF) 100 MCG/2ML IJ SOLN
INTRAMUSCULAR | Status: AC
  Administered 2019-12-12: 50 ug via INTRAVENOUS
  Filled 2019-12-12: qty 2

## 2019-12-12 MED ORDER — SENNOSIDES-DOCUSATE SODIUM 8.6-50 MG PO TABS: 1.0000 | ORAL_TABLET | Freq: Every evening | ORAL | Status: DC | PRN

## 2019-12-12 MED ORDER — MENTHOL 3 MG MT LOZG: 1.0000 | LOZENGE | OROMUCOSAL | Status: DC | PRN

## 2019-12-12 MED ORDER — DOCUSATE SODIUM 100 MG PO CAPS
100.0000 mg | ORAL_CAPSULE | Freq: Two times a day (BID) | ORAL | Status: DC
  Administered 2019-12-12 – 2019-12-13 (×2): 100 mg via ORAL
  Filled 2019-12-12 (×2): qty 1

## 2019-12-12 MED ORDER — OXYCODONE HCL 5 MG PO TABS
10.0000 mg | ORAL_TABLET | ORAL | Status: DC | PRN
  Administered 2019-12-13: 10 mg via ORAL
  Filled 2019-12-12: qty 2

## 2019-12-12 MED ORDER — ONDANSETRON HCL 4 MG/2ML IJ SOLN
INTRAMUSCULAR | Status: DC | PRN
  Administered 2019-12-12: 4 mg via INTRAVENOUS

## 2019-12-12 MED ORDER — PROPOFOL 10 MG/ML IV BOLUS
INTRAVENOUS | Status: DC | PRN
  Administered 2019-12-12: 30 mg via INTRAVENOUS
  Administered 2019-12-12: 120 mg via INTRAVENOUS

## 2019-12-12 MED ORDER — BRIMONIDINE TARTRATE-TIMOLOL 0.2-0.5 % OP SOLN
1.0000 [drp] | Freq: Two times a day (BID) | OPHTHALMIC | Status: DC
  Filled 2019-12-12: qty 5

## 2019-12-12 MED ORDER — LIDOCAINE 2% (20 MG/ML) 5 ML SYRINGE
INTRAMUSCULAR | Status: AC
  Filled 2019-12-12: qty 5

## 2019-12-12 MED ORDER — THROMBIN 20000 UNITS EX SOLR
CUTANEOUS | Status: DC | PRN
  Administered 2019-12-12: 20 mL via TOPICAL

## 2019-12-12 MED ORDER — DEXAMETHASONE SODIUM PHOSPHATE 10 MG/ML IJ SOLN
INTRAMUSCULAR | Status: DC | PRN
  Administered 2019-12-12: 8 mg via INTRAVENOUS

## 2019-12-12 MED ORDER — GABAPENTIN 300 MG PO CAPS
300.0000 mg | ORAL_CAPSULE | Freq: Three times a day (TID) | ORAL | Status: DC
  Administered 2019-12-12 – 2019-12-13 (×3): 300 mg via ORAL
  Filled 2019-12-12 (×3): qty 1

## 2019-12-12 MED ORDER — CHLORHEXIDINE GLUCONATE CLOTH 2 % EX PADS: 6.0000 | MEDICATED_PAD | Freq: Once | CUTANEOUS | Status: DC

## 2019-12-12 MED ORDER — LIDOCAINE-EPINEPHRINE 0.5 %-1:200000 IJ SOLN
INTRAMUSCULAR | Status: AC
  Filled 2019-12-12: qty 1

## 2019-12-12 MED ORDER — ONDANSETRON HCL 4 MG PO TABS: 4.0000 mg | ORAL_TABLET | Freq: Four times a day (QID) | ORAL | Status: DC | PRN

## 2019-12-12 MED ORDER — BRIMONIDINE TARTRATE 0.2 % OP SOLN
1.0000 [drp] | Freq: Two times a day (BID) | OPHTHALMIC | Status: DC
  Administered 2019-12-12 – 2019-12-13 (×2): 1 [drp] via OPHTHALMIC
  Filled 2019-12-12: qty 5

## 2019-12-12 MED ORDER — FENTANYL CITRATE (PF) 100 MCG/2ML IJ SOLN
25.0000 ug | INTRAMUSCULAR | Status: DC | PRN
  Administered 2019-12-12: 50 ug via INTRAVENOUS

## 2019-12-12 MED ORDER — DEXAMETHASONE SODIUM PHOSPHATE 10 MG/ML IJ SOLN
INTRAMUSCULAR | Status: AC
  Filled 2019-12-12: qty 1

## 2019-12-12 MED ORDER — SUGAMMADEX SODIUM 200 MG/2ML IV SOLN
INTRAVENOUS | Status: DC | PRN
  Administered 2019-12-12: 200 mg via INTRAVENOUS

## 2019-12-12 MED ORDER — MAGNESIUM CITRATE PO SOLN: 1.0000 | Freq: Once | ORAL | Status: DC | PRN

## 2019-12-12 MED ORDER — ROCURONIUM BROMIDE 10 MG/ML (PF) SYRINGE
PREFILLED_SYRINGE | INTRAVENOUS | Status: AC
  Filled 2019-12-12: qty 30

## 2019-12-12 MED ORDER — PHENOL 1.4 % MT LIQD: 1.0000 | OROMUCOSAL | Status: DC | PRN

## 2019-12-12 MED ORDER — MAGNESIUM CHLORIDE 64 MG PO TBEC
1.0000 | DELAYED_RELEASE_TABLET | Freq: Every day | ORAL | Status: DC
  Administered 2019-12-13: 64 mg via ORAL
  Filled 2019-12-12 (×2): qty 1

## 2019-12-12 MED ORDER — LIDOCAINE 2% (20 MG/ML) 5 ML SYRINGE
INTRAMUSCULAR | Status: DC | PRN
  Administered 2019-12-12: 80 mg via INTRAVENOUS

## 2019-12-12 MED ORDER — THROMBIN 20000 UNITS EX SOLR
CUTANEOUS | Status: AC
  Filled 2019-12-12: qty 20000

## 2019-12-12 MED ORDER — DULOXETINE HCL 30 MG PO CPEP
30.0000 mg | ORAL_CAPSULE | Freq: Every day | ORAL | Status: DC
  Administered 2019-12-13: 30 mg via ORAL
  Filled 2019-12-12: qty 1

## 2019-12-12 MED ORDER — FENTANYL CITRATE (PF) 250 MCG/5ML IJ SOLN
INTRAMUSCULAR | Status: AC
  Filled 2019-12-12: qty 5

## 2019-12-12 MED ORDER — PROPOFOL 10 MG/ML IV BOLUS
INTRAVENOUS | Status: AC
  Filled 2019-12-12: qty 20

## 2019-12-12 MED ORDER — OXYCODONE HCL 5 MG PO TABS
ORAL_TABLET | ORAL | Status: AC
  Filled 2019-12-12: qty 1

## 2019-12-12 MED ORDER — ONDANSETRON HCL 4 MG/2ML IJ SOLN: 4.0000 mg | Freq: Four times a day (QID) | INTRAMUSCULAR | Status: DC | PRN

## 2019-12-12 MED ORDER — DIAZEPAM 5 MG PO TABS
ORAL_TABLET | ORAL | Status: AC
  Administered 2019-12-12: 5 mg via ORAL
  Filled 2019-12-12: qty 1

## 2019-12-12 MED ORDER — SODIUM CHLORIDE 0.9% FLUSH: 3.0000 mL | Freq: Two times a day (BID) | INTRAVENOUS | Status: DC

## 2019-12-12 MED ORDER — 0.9 % SODIUM CHLORIDE (POUR BTL) OPTIME
TOPICAL | Status: DC | PRN
  Administered 2019-12-12: 1000 mL

## 2019-12-12 MED ORDER — CEFAZOLIN SODIUM-DEXTROSE 2-4 GM/100ML-% IV SOLN
2.0000 g | INTRAVENOUS | Status: AC
  Administered 2019-12-12: 2 g via INTRAVENOUS
  Filled 2019-12-12: qty 100

## 2019-12-12 MED ORDER — FENTANYL CITRATE (PF) 250 MCG/5ML IJ SOLN
INTRAMUSCULAR | Status: DC | PRN
  Administered 2019-12-12: 50 ug via INTRAVENOUS
  Administered 2019-12-12: 25 ug via INTRAVENOUS
  Administered 2019-12-12 (×2): 50 ug via INTRAVENOUS
  Administered 2019-12-12: 25 ug via INTRAVENOUS
  Administered 2019-12-12: 50 ug via INTRAVENOUS

## 2019-12-12 MED ORDER — OXYCODONE HCL 5 MG PO TABS
5.0000 mg | ORAL_TABLET | ORAL | Status: DC | PRN
  Filled 2019-12-12: qty 1

## 2019-12-12 MED ORDER — ALBUMIN HUMAN 5 % IV SOLN: INTRAVENOUS | Status: DC | PRN

## 2019-12-12 MED ORDER — POTASSIUM CHLORIDE IN NACL 20-0.9 MEQ/L-% IV SOLN
INTRAVENOUS | Status: DC
  Filled 2019-12-12: qty 1000

## 2019-12-12 MED ORDER — ROCURONIUM BROMIDE 10 MG/ML (PF) SYRINGE
PREFILLED_SYRINGE | INTRAVENOUS | Status: DC | PRN
  Administered 2019-12-12: 20 mg via INTRAVENOUS
  Administered 2019-12-12: 30 mg via INTRAVENOUS
  Administered 2019-12-12 (×4): 20 mg via INTRAVENOUS

## 2019-12-12 MED ORDER — SODIUM CHLORIDE 0.9% FLUSH: 3.0000 mL | INTRAVENOUS | Status: DC | PRN

## 2019-12-12 MED ORDER — OXYCODONE HCL ER 10 MG PO T12A
10.0000 mg | EXTENDED_RELEASE_TABLET | Freq: Two times a day (BID) | ORAL | Status: DC
  Administered 2019-12-12 – 2019-12-13 (×2): 10 mg via ORAL
  Filled 2019-12-12 (×2): qty 1

## 2019-12-12 MED ORDER — BUPIVACAINE HCL (PF) 0.5 % IJ SOLN
INTRAMUSCULAR | Status: DC | PRN
  Administered 2019-12-12: 30 mL

## 2019-12-12 MED ORDER — LACTATED RINGERS IV SOLN: INTRAVENOUS | Status: DC | PRN

## 2019-12-12 MED ORDER — CALCIUM CARBONATE ANTACID 500 MG PO CHEW
1.0000 | CHEWABLE_TABLET | Freq: Every day | ORAL | Status: DC
  Administered 2019-12-12: 200 mg via ORAL
  Filled 2019-12-12 (×2): qty 1

## 2019-12-12 MED ORDER — SODIUM CHLORIDE 0.9 % IV SOLN: 250.0000 mL | INTRAVENOUS | Status: DC

## 2019-12-12 MED ORDER — TIMOLOL MALEATE 0.5 % OP SOLN
1.0000 [drp] | Freq: Two times a day (BID) | OPHTHALMIC | Status: DC
  Administered 2019-12-12 – 2019-12-13 (×2): 1 [drp] via OPHTHALMIC
  Filled 2019-12-12: qty 5

## 2019-12-12 MED ORDER — ACETAMINOPHEN 325 MG PO TABS: 650.0000 mg | ORAL_TABLET | ORAL | Status: DC | PRN

## 2019-12-12 MED ORDER — CALCIUM-MAGNESIUM 200-100 MG PO TABS: ORAL_TABLET | Freq: Every day | ORAL | Status: DC

## 2019-12-12 MED ORDER — ONDANSETRON HCL 4 MG/2ML IJ SOLN
INTRAMUSCULAR | Status: AC
  Filled 2019-12-12: qty 2

## 2019-12-12 MED ORDER — PHENYLEPHRINE HCL-NACL 10-0.9 MG/250ML-% IV SOLN
INTRAVENOUS | Status: DC | PRN
  Administered 2019-12-12: 20 ug/min via INTRAVENOUS

## 2019-12-12 MED ORDER — ACETAMINOPHEN 650 MG RE SUPP: 650.0000 mg | RECTAL | Status: DC | PRN

## 2019-12-12 MED ORDER — MORPHINE SULFATE (PF) 2 MG/ML IV SOLN: 1.0000 mg | INTRAVENOUS | Status: DC | PRN

## 2019-12-12 MED ORDER — DIAZEPAM 5 MG PO TABS
5.0000 mg | ORAL_TABLET | Freq: Four times a day (QID) | ORAL | Status: DC | PRN
  Administered 2019-12-13: 5 mg via ORAL
  Filled 2019-12-12: qty 1

## 2019-12-12 MED ORDER — VITAMIN D 25 MCG (1000 UNIT) PO TABS
2000.0000 [IU] | ORAL_TABLET | Freq: Every day | ORAL | Status: DC
  Administered 2019-12-12 – 2019-12-13 (×2): 2000 [IU] via ORAL
  Filled 2019-12-12 (×4): qty 2

## 2019-12-12 MED ORDER — LIDOCAINE-EPINEPHRINE 0.5 %-1:200000 IJ SOLN
INTRAMUSCULAR | Status: DC | PRN
  Administered 2019-12-12: 10 mL

## 2019-12-12 SURGICAL SUPPLY — 75 items
ADH SKN CLS APL DERMABOND .7 (GAUZE/BANDAGES/DRESSINGS) ×1
APL SKNCLS STERI-STRIP NONHPOA (GAUZE/BANDAGES/DRESSINGS)
BENZOIN TINCTURE PRP APPL 2/3 (GAUZE/BANDAGES/DRESSINGS) IMPLANT
BLADE CLIPPER SURG (BLADE) IMPLANT
BLOCKER XIA CT (Orthopedic Implant) ×8 IMPLANT
BUR MATCHSTICK NEURO 3.0 LAGG (BURR) ×3 IMPLANT
BUR PRECISION FLUTE 5.0 (BURR) ×3 IMPLANT
BUR ROUND FLUTED 5 RND (BURR) ×1 IMPLANT
BUR ROUND FLUTED 5MM RND (BURR) ×1
CANISTER SUCT 3000ML PPV (MISCELLANEOUS) ×3 IMPLANT
CARTRIDGE OIL MAESTRO DRILL (MISCELLANEOUS) ×1 IMPLANT
CLOSURE WOUND 1/2 X4 (GAUZE/BANDAGES/DRESSINGS)
CNTNR URN SCR LID CUP LEK RST (MISCELLANEOUS) ×1 IMPLANT
CONT SPEC 4OZ STRL OR WHT (MISCELLANEOUS) ×3
COVER BACK TABLE 60X90IN (DRAPES) ×3 IMPLANT
COVER WAND RF STERILE (DRAPES) ×3 IMPLANT
DECANTER SPIKE VIAL GLASS SM (MISCELLANEOUS) ×3 IMPLANT
DERMABOND ADVANCED (GAUZE/BANDAGES/DRESSINGS) ×2
DERMABOND ADVANCED .7 DNX12 (GAUZE/BANDAGES/DRESSINGS) ×1 IMPLANT
DIFFUSER DRILL AIR PNEUMATIC (MISCELLANEOUS) ×3 IMPLANT
DRAPE C-ARM 42X72 X-RAY (DRAPES) ×4 IMPLANT
DRAPE C-ARMOR (DRAPES) ×2 IMPLANT
DRAPE LAPAROTOMY 100X72X124 (DRAPES) ×3 IMPLANT
DRAPE SURG 17X23 STRL (DRAPES) ×3 IMPLANT
DRSG OPSITE POSTOP 4X8 (GAUZE/BANDAGES/DRESSINGS) ×2 IMPLANT
DURAPREP 26ML APPLICATOR (WOUND CARE) ×3 IMPLANT
ELECT REM PT RETURN 9FT ADLT (ELECTROSURGICAL) ×3
ELECTRODE REM PT RTRN 9FT ADLT (ELECTROSURGICAL) ×1 IMPLANT
GAUZE 4X4 16PLY RFD (DISPOSABLE) IMPLANT
GAUZE SPONGE 4X4 12PLY STRL (GAUZE/BANDAGES/DRESSINGS) IMPLANT
GLOVE ECLIPSE 6.5 STRL STRAW (GLOVE) ×6 IMPLANT
GLOVE EXAM NITRILE XL STR (GLOVE) IMPLANT
GLOVE SS BIOGEL STRL SZ 6.5 (GLOVE) IMPLANT
GLOVE SS BIOGEL STRL SZ 7 (GLOVE) IMPLANT
GLOVE SUPERSENSE BIOGEL SZ 6.5 (GLOVE) ×4
GLOVE SUPERSENSE BIOGEL SZ 7 (GLOVE) ×8
GLOVE SURG SS PI 6.5 STRL IVOR (GLOVE) ×2 IMPLANT
GLOVE SURG SS PI 7.5 STRL IVOR (GLOVE) ×6 IMPLANT
GOWN STRL REUS W/ TWL LRG LVL3 (GOWN DISPOSABLE) ×2 IMPLANT
GOWN STRL REUS W/ TWL XL LVL3 (GOWN DISPOSABLE) IMPLANT
GOWN STRL REUS W/TWL 2XL LVL3 (GOWN DISPOSABLE) IMPLANT
GOWN STRL REUS W/TWL LRG LVL3 (GOWN DISPOSABLE) ×6
GOWN STRL REUS W/TWL XL LVL3 (GOWN DISPOSABLE) ×6
KIT BASIN OR (CUSTOM PROCEDURE TRAY) ×3 IMPLANT
KIT POSITION SURG JACKSON T1 (MISCELLANEOUS) ×3 IMPLANT
KIT TURNOVER KIT B (KITS) ×3 IMPLANT
MILL MEDIUM DISP (BLADE) ×2 IMPLANT
NDL HYPO 25X1 1.5 SAFETY (NEEDLE) ×1 IMPLANT
NDL SPNL 18GX3.5 QUINCKE PK (NEEDLE) IMPLANT
NEEDLE HYPO 25X1 1.5 SAFETY (NEEDLE) ×3 IMPLANT
NEEDLE SPNL 18GX3.5 QUINCKE PK (NEEDLE) IMPLANT
NS IRRIG 1000ML POUR BTL (IV SOLUTION) ×3 IMPLANT
OIL CARTRIDGE MAESTRO DRILL (MISCELLANEOUS) ×3
PACK LAMINECTOMY NEURO (CUSTOM PROCEDURE TRAY) ×3 IMPLANT
PAD ARMBOARD 7.5X6 YLW CONV (MISCELLANEOUS) ×2 IMPLANT
ROD SPINAL XIA 4.5X30 (Screw) ×4 IMPLANT
SCREW PA XIA CT 5.5X30 (Screw) ×2 IMPLANT
SCREW PA XIA CT 5.5X35 (Screw) IMPLANT
SCREW PA XIA CT 6.5X30 (Screw) ×2 IMPLANT
SCREW PA XIA CT 6.5X35 (Screw) ×2 IMPLANT
SCREW PA XIA CT 6.5X40 (Screw) ×2 IMPLANT
SPACER RISE 10X22 9-15MM (Spacer) ×4 IMPLANT
SPONGE LAP 4X18 RFD (DISPOSABLE) IMPLANT
SPONGE SURGIFOAM ABS GEL 100 (HEMOSTASIS) ×3 IMPLANT
STRIP CLOSURE SKIN 1/2X4 (GAUZE/BANDAGES/DRESSINGS) IMPLANT
SUT PROLENE 6 0 BV (SUTURE) IMPLANT
SUT VIC AB 0 CT1 18XCR BRD8 (SUTURE) ×1 IMPLANT
SUT VIC AB 0 CT1 8-18 (SUTURE) ×6
SUT VIC AB 2-0 CT1 18 (SUTURE) ×3 IMPLANT
SUT VIC AB 3-0 SH 8-18 (SUTURE) ×5 IMPLANT
TAP CANN XIA CT 5.5X35 (Orthopedic Implant) ×4 IMPLANT
TOWEL GREEN STERILE (TOWEL DISPOSABLE) ×3 IMPLANT
TOWEL GREEN STERILE FF (TOWEL DISPOSABLE) ×3 IMPLANT
TRAY FOLEY MTR SLVR 16FR STAT (SET/KITS/TRAYS/PACK) ×3 IMPLANT
WATER STERILE IRR 1000ML POUR (IV SOLUTION) ×3 IMPLANT

## 2019-12-12 NOTE — Transfer of Care (Signed)
Immediate Anesthesia Transfer of Care Note  Patient: Breanna Davidson  Procedure(s) Performed: Lumbar four-five Posterior lumbar interbody fusion (N/A )  Patient Location: PACU  Anesthesia Type:General  Level of Consciousness: awake  Airway & Oxygen Therapy: Patient Spontanous Breathing and Patient connected to nasal cannula oxygen  Post-op Assessment: Report given to RN and Post -op Vital signs reviewed and stable  Post vital signs: Reviewed and stable  Last Vitals:  Vitals Value Taken Time  BP 123/53 12/12/19 1420  Temp    Pulse 87 12/12/19 1422  Resp 13 12/12/19 1422  SpO2 98 % 12/12/19 1422  Vitals shown include unvalidated device data.  Last Pain: There were no vitals filed for this visit.       Complications: No apparent anesthesia complications

## 2019-12-12 NOTE — Care Management Obs Status (Signed)
[place] NOTIFICATION   Patient Details  Name: Breanna Davidson MRN: [id number] Date of Birth: 07-22-1951   Medicare Observation Status Notification Given:  Yes    Faven Watterson, LCSW 12/12/2019, 7:02 PM

## 2019-12-12 NOTE — Anesthesia Procedure Notes (Signed)
Procedure Name: Intubation Performed by: Milford Cage, CRNA Pre-anesthesia Checklist: Patient identified, Emergency Drugs available, Suction available and Patient being monitored Patient Re-evaluated:Patient Re-evaluated prior to induction Oxygen Delivery Method: Circle System Utilized Preoxygenation: Pre-oxygenation with 100% oxygen Induction Type: IV induction Ventilation: Mask ventilation without difficulty Laryngoscope Size: Miller and 2 Grade View: Grade I Tube type: Oral Tube size: 7.0 mm Number of attempts: 2 Airway Equipment and Method: Stylet and Oral airway Placement Confirmation: ETT inserted through vocal cords under direct vision,  positive ETCO2 and breath sounds checked- equal and bilateral Secured at: 21 cm Tube secured with: Tape Dental Injury: Teeth and Oropharynx as per pre-operative assessment  Comments: G2 with MAC3. G1 view with Mil2

## 2019-12-12 NOTE — Op Note (Signed)
12/12/2019  3:04 PM  PATIENT:  Breanna Davidson  69 y.o. 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 POST-OPERATIVE DIAGNOSIS:  Same PROCEDURE:  Procedure(s): Lumbar four-five Posterior lumbar interbody fusion with Rise interbody cages(globus), autograft morsels Lumbar laminectomy beyond the needed exposure for a plif, L4, L5 Posterolateral arthrodesis L4/5 with autograft morsels Non segmental pedicle screw instrumentation Stryker Pollyann Samples screws   SURGEON:  Surgeon(s): Ashok Pall, MD Eustace Moore, MD  ASSISTANTS:Jones, Shanon Brow  ANESTHESIA:   general  EBL:  Total I/O In: 2500 [I.V.:2000; IV Piggyback:500] Out: [id number] [Urine:105; Blood:850]  BLOOD ADMINISTERED:none  CELL SAVER GIVEN:none  COUNT:per nursing  DRAINS: none   SPECIMEN:  No Specimen  DICTATION: [name] is a 69 y.o. female whom was taken to the operating room intubated, and placed under a general anesthetic without difficulty. A foley catheter was placed under sterile conditions. [name] was positioned prone on a Jackson stable with all pressure points properly padded.  Her lumbar region was prepped and draped in a sterile manner. I infiltrated 10cc's 1/2%lidocaine/1:2000,000 strength epinephrine into the planned incision. I opened the skin with a 10 blade and took the incision down to the thoracolumbar fascia. I exposed the lamina of L3,4,and L5 in a subperiosteal fashion bilaterally. I confirmed my location with an intraoperative xray.  I placed self retaining retractors and started the decompression.  I decompressed the spinal canal at L4/5 via a complete laminectomy of L4 including inferior facetectomies of L4, and partial superior facetectomies of L5. I removed thickened ligamentum flavum and decompressed the thecal sac and L4, and L5 roots. I used the drill, and Kerrison punches to remove the bone, and the punches on the  ligament. Both L4 roots were decompressed in the foramina and lateral recesses.  PLIF's were performed at L4/5 in the same fashion. I opened the disc space with a 15 blade then used a variety of instruments to remove the disc and prepare the space for the arthrodesis. I used curettes, rongeurs, punches, shavers for the disc space, and rasps in the discetomy. I measured the disc space and placed  Rise cages(Globus) into the disc space(s). The cages and the disc spaces were packed with autograft morsels.  I decorticated the lateral bone at L4 and 5. I then placed autograft morsels on the decorticated surfaces to complete the posterolateral arthrodesis.  I placed pedicle screws at L4 and L5, using fluoroscopic guidance. I drilled a pilot hole, then cannulated the pedicle with a tap at each site.  I assessed each site for pedicle violations. No cutouts were appreciated. Screws Pollyann Samples, Stryker) were then placed at each site without difficulty. We attached rods and locking caps with the appropriate tools. The locking caps were secured with torque limited screwdrivers. Final films were performed and the final construct appeared to be in good position.  We closed the wound in a layered fashion. We approximated the thoracolumbar fascia, subcutaneous, and subcuticular planes with vicryl sutures. I used Dermabond and an occlusive bandage for a sterile dressing.     PLAN OF CARE: Admit to inpatient   PATIENT DISPOSITION:  PACU - hemodynamically stable.   Delay start of Pharmacological VTE agent (>24hrs) due to surgical blood loss or risk of bleeding:  yes

## 2019-12-12 NOTE — H&P (Signed)
Breanna Davidson is an 69 y.o. female.   Chief Complaint: Low Back pain HPI: spondylolisthesis lumbAr with stenosis and neurogenic claudication  Past Medical History:  Diagnosis Date  . Allergy   . Chest pain    2017 ED VISIT FOR CHEST PAIN , R/O AS GASTRIC CAUSE   . Depression    per NP notes, on cymbalta  . Headache    per patient very rare sometimes when not well rested  . Hyperlipidemia   . Increased pressure in the eye    "THEY DID SURGERY ON MY EYES TO REDUCE THE PRESSURE , THEY MADE TWO HOLES IN MY EYES "  . Osteopenia   . Spinal stenosis    LUMBAR   . Vertigo    IMPROVED BUT HAS OCCASIONAL SX    Past Surgical History:  Procedure Laterality Date  . APPENDECTOMY    . CESAREAN SECTION    . DILATION AND CURETTAGE OF UTERUS    . EYE SURGERY     "THEY DID SURGERY ON MY EYES TO REDUCE THE PRESSURE , THEY MADE TWO HOLES IN MY EYES "  . REDUCTION MAMMAPLASTY  1988  . 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 Breast cancer Mother 75       and again at 71  . Lung disease Mother   . Cancer Mother   . Emphysema Mother   . Breast cancer Maternal Grandmother   . CAD Father   . Hyperlipidemia Sister   . Mental illness Daughter        bipolar  . Cystic fibrosis Daughter   . Breast cancer Maternal Aunt    Social History:  reports that she has never smoked. She has never used smokeless tobacco. She reports previous alcohol use of about 1.0 standard drinks of alcohol per week. She reports that she does not use drugs.  Allergies:  Allergies  Allergen Reactions  . Prednisone Other (See Comments)    Altered Mental Status    Medications Prior to Admission  Medication Sig Dispense Refill  . CALCIUM-MAGNESIUM PO Take 1 tablet by mouth daily.    . Cholecalciferol (VITAMIN D) 50  MCG (2000 UT) CAPS Take 2,000 Units by mouth daily.     . Coenzyme Q10-Red Yeast Rice (CO Q-10 PLUS RED YEAST RICE PO) Take 1 capsule by mouth daily.    . COMBIGAN 0.2-0.5 % ophthalmic solution Place 1 drop into both eyes 2 (two) times daily.     . DULoxetine (CYMBALTA) 30 MG capsule Take 1 capsule (30 mg total) by mouth daily. 90 capsule 3  . Emollient (COLLAGEN EX) Take 1 Scoop by mouth daily. Mix with coffee    . Omega-3 Fatty Acids (FISH OIL) 1000 MG CAPS Take 1,000 mg by mouth daily.      Results for orders placed or performed during the hospital encounter of 12/10/19 (from the past 48 hour(s))  ABO/Rh     Status: None   Collection Time: 12/10/19  9:46 AM  Result Value Ref Range   ABO/RH(D)      A POS Performed at [facility] [address], [place], [place] [address]   SARS CORONAVIRUS 2 (TAT 6-24 HRS) Nasopharyngeal Nasopharyngeal Swab     Status: None   Collection Time: 12/10/19 10:17 AM  Specimen: Nasopharyngeal Swab  Result Value Ref Range   SARS Coronavirus 2 NEGATIVE NEGATIVE    Comment: (NOTE) SARS-CoV-2 target nucleic acids are NOT DETECTED. The SARS-CoV-2 RNA is generally detectable in upper and lower respiratory specimens during the acute phase of infection. Negative results do not preclude SARS-CoV-2 infection, do not rule out co-infections with other pathogens, and should not be used as the sole basis for treatment or other patient management decisions. Negative results must be combined with clinical observations, patient history, and epidemiological information. The expected result is Negative. Fact Sheet for Patients: SugarRoll.be Fact Sheet for Healthcare Providers: https://www.woods-mathews.com/ This test is not yet approved or cleared by the [place] FDA and  has been authorized for detection and/or diagnosis of SARS-CoV-2 by FDA under an Emergency Use Authorization (EUA). This EUA will remain  in  effect (meaning this test can be used) for the duration of the COVID-19 declaration under Section 56 4(b)(1) of the Act, 21 U.S.C. section 360bbb-3(b)(1), unless the authorization is terminated or revoked sooner. Performed at [facility], [place] [address], [place], [place] [address]    No results found.  Review of Systems  Blood pressure 110/62, pulse 64, temperature 98.1 F (36.7 C), resp. rate 18, height 5\' 2"  (1.575 m), weight 74.8 kg, SpO2 100 %. Physical Exam  Constitutional: She is oriented to person, place, and time. She appears well-developed and well-nourished.  HENT:  Head: Normocephalic and atraumatic.  Eyes: Pupils are equal, round, and reactive to light. EOM are normal.  Cardiovascular: Normal rate and regular rhythm.  Respiratory: Effort normal and breath sounds normal.  GI: Soft. Bowel sounds are normal.  Musculoskeletal:     Cervical back: Normal range of motion and neck supple.  Neurological: She is alert and oriented to person, place, and time. She has normal reflexes. No cranial nerve deficit. Coordination normal.  Skin: Skin is warm and dry.     Assessment/Plan Spondylolisthesis lumbAr with stenosis. OR for decompression and Arthrodesis  Ashok Pall, MD 12/12/2019, 9:28 AM

## 2019-12-12 NOTE — Care Management CC44 (Signed)
Condition Code 44 Documentation Completed  Patient Details  Name: Breanna Davidson MRN: [id number] Date of Birth: August 08, 1951   Condition Code 44 given:  Yes Patient signature on Condition Code 44 notice:  Yes Documentation of 2 MD's agreement:  Yes Code 44 added to claim:  Yes    Canio Winokur, LCSW 12/12/2019, 7:02 PM

## 2019-12-12 NOTE — Evaluation (Addendum)
Physical Therapy Evaluation Patient Details Name: Breanna Davidson MRN: [id number] DOB: Dec 10, 1950 Today's Date: 12/12/2019   History of Present Illness  Pt is a 69 y/o female s/p L4-5 PLIF. PMH includes vertigo and L THA.   Clinical Impression  Patient is s/p above surgery resulting in the deficits listed below (see PT Problem List). Pt guarded during gait, however, tolerated well. Required min to min guard A for mobility. Educated about back precautions and generalized walking program. Patient will benefit from skilled PT to increase their independence and safety with mobility (while adhering to their precautions) to allow discharge to the venue listed below.      Follow Up Recommendations No PT follow up    Equipment Recommendations  None recommended by PT    Recommendations for Other Services       Precautions / Restrictions Precautions Precautions: Back Precaution Booklet Issued: Yes (comment) Precaution Comments: Reviewed back precautions with pt.  Required Braces or Orthoses: Spinal Brace Spinal Brace: Lumbar corset;Applied in sitting position Restrictions Weight Bearing Restrictions: No      Mobility  Bed Mobility Overal bed mobility: Needs Assistance Bed Mobility: Rolling;Sidelying to Sit;Sit to Sidelying Rolling: Min assist Sidelying to sit: Min guard     Sit to sidelying: Min assist General bed mobility comments: Min A to assist with rolling to side. Also required min A for LE assist for return to sidelying. Educated about use of log roll technique to maintain precautions.   Transfers Overall transfer level: Needs assistance Equipment used: None Transfers: Sit to/from Stand Sit to Stand: Min assist         General transfer comment: Min A for lift assist and steadying.   Ambulation/Gait Ambulation/Gait assistance: Min guard Gait Distance (Feet): 200 Feet Assistive device: IV Pole Gait Pattern/deviations: Step-through pattern;Decreased stride  length Gait velocity: Decreased   General Gait Details: Guarded gait secondary to pain. Pt holding to IV pole for support. Educated about walking program to perform at home.   Stairs            Wheelchair Mobility    Modified Rankin (Stroke Patients Only)       Balance Overall balance assessment: Needs assistance Sitting-balance support: No upper extremity supported;Feet supported Sitting balance-Leahy Scale: Fair     Standing balance support: No upper extremity supported;During functional activity Standing balance-Leahy Scale: Fair                               Pertinent Vitals/Pain Pain Assessment: 0-10 Pain Score: 8  Pain Location: back Pain Descriptors / Indicators: Aching;Operative site guarding Pain Intervention(s): Limited activity within patient's tolerance;Monitored during session;Repositioned    Home Living Family/patient expects to be discharged to:: Private residence Living Arrangements: Spouse/significant other Available Help at Discharge: Family;Available 24 hours/day Type of Home: House Home Access: Stairs to enter Entrance Stairs-Rails: Can reach both;Left;Right Entrance Stairs-Number of Steps: 3 Home Layout: One level Home Equipment: Walker - 2 wheels;Bedside commode      Prior Function Level of Independence: Independent               Hand Dominance        Extremity/Trunk Assessment   Upper Extremity Assessment Upper Extremity Assessment: Defer to OT evaluation    Lower Extremity Assessment Lower Extremity Assessment: Generalized weakness    Cervical / Trunk Assessment Cervical / Trunk Assessment: Other exceptions Cervical / Trunk Exceptions: s/p PLIF   Communication   Communication:  No difficulties  Cognition Arousal/Alertness: Awake/alert Behavior During Therapy: WFL for tasks assessed/performed Overall Cognitive Status: Within Functional Limits for tasks assessed                                         General Comments General comments (skin integrity, edema, etc.): Pt's husband present during session    Exercises     Assessment/Plan    PT Assessment Patient needs continued PT services  PT Problem List Decreased strength;Decreased activity tolerance;Decreased mobility;Decreased knowledge of precautions;Pain       PT Treatment Interventions Gait training;Stair training;Functional mobility training;Therapeutic activities;Therapeutic exercise;Balance training;Patient/family education    PT Goals (Current goals can be found in the Care Plan section)  Acute Rehab PT Goals Patient Stated Goal: to go home PT Goal Formulation: With patient Time For Goal Achievement: 12/26/19 Potential to Achieve Goals: Good    Frequency Min 5X/week   Barriers to discharge        Co-evaluation               AM-PAC PT "6 Clicks" Mobility  Outcome Measure Help needed turning from your back to your side while in a flat bed without using bedrails?: A Little Help needed moving from lying on your back to sitting on the side of a flat bed without using bedrails?: A Little Help needed moving to and from a bed to a chair (including a wheelchair)?: A Little Help needed standing up from a chair using your arms (e.g., wheelchair or bedside chair)?: A Little Help needed to walk in hospital room?: A Little Help needed climbing 3-5 steps with a railing? : A Little 6 Click Score: 18    End of Session Equipment Utilized During Treatment: Gait belt;Back brace Activity Tolerance: Patient tolerated treatment well Patient left: in bed;with call bell/phone within reach Nurse Communication: Mobility status PT Visit Diagnosis: Muscle weakness (generalized) (M62.81);Pain Pain - part of body: (back)    Time: [id number] PT Time Calculation (min) (ACUTE ONLY): 16 min   Charges:   PT Evaluation $PT Eval Low Complexity: 1 Low          Lou Miner, DPT  Acute Rehabilitation Services   Pager: [phone] Office: [phone]   Rudean Hitt 12/12/2019, 6:22 PM

## 2019-12-13 ENCOUNTER — Encounter (HOSPITAL_COMMUNITY): Payer: Self-pay | Admitting: Neurosurgery

## 2019-12-13 DIAGNOSIS — R519 Headache, unspecified: Secondary | ICD-10-CM | POA: Diagnosis not present

## 2019-12-13 DIAGNOSIS — Z836 Family history of other diseases of the respiratory system: Secondary | ICD-10-CM | POA: Diagnosis not present

## 2019-12-13 DIAGNOSIS — M8580 Other specified disorders of bone density and structure, unspecified site: Secondary | ICD-10-CM | POA: Diagnosis not present

## 2019-12-13 DIAGNOSIS — M48062 Spinal stenosis, lumbar region with neurogenic claudication: Secondary | ICD-10-CM | POA: Diagnosis not present

## 2019-12-13 DIAGNOSIS — Z825 Family history of asthma and other chronic lower respiratory diseases: Secondary | ICD-10-CM | POA: Diagnosis not present

## 2019-12-13 DIAGNOSIS — Z803 Family history of malignant neoplasm of breast: Secondary | ICD-10-CM | POA: Diagnosis not present

## 2019-12-13 DIAGNOSIS — M1990 Unspecified osteoarthritis, unspecified site: Secondary | ICD-10-CM | POA: Diagnosis not present

## 2019-12-13 DIAGNOSIS — Z79899 Other long term (current) drug therapy: Secondary | ICD-10-CM | POA: Diagnosis not present

## 2019-12-13 DIAGNOSIS — R42 Dizziness and giddiness: Secondary | ICD-10-CM | POA: Diagnosis not present

## 2019-12-13 DIAGNOSIS — Z96642 Presence of left artificial hip joint: Secondary | ICD-10-CM | POA: Diagnosis not present

## 2019-12-13 DIAGNOSIS — E785 Hyperlipidemia, unspecified: Secondary | ICD-10-CM | POA: Diagnosis not present

## 2019-12-13 DIAGNOSIS — Z888 Allergy status to other drugs, medicaments and biological substances status: Secondary | ICD-10-CM | POA: Diagnosis not present

## 2019-12-13 DIAGNOSIS — M5126 Other intervertebral disc displacement, lumbar region: Secondary | ICD-10-CM | POA: Diagnosis not present

## 2019-12-13 DIAGNOSIS — M4316 Spondylolisthesis, lumbar region: Secondary | ICD-10-CM | POA: Diagnosis not present

## 2019-12-13 MED ORDER — OXYCODONE HCL 5 MG PO TABS: 5.0000 mg | ORAL_TABLET | Freq: Four times a day (QID) | ORAL | 0 refills | Status: AC | PRN

## 2019-12-13 MED ORDER — TIZANIDINE HCL 4 MG PO TABS: 4.0000 mg | ORAL_TABLET | Freq: Four times a day (QID) | ORAL | 0 refills | Status: DC | PRN

## 2019-12-13 NOTE — Progress Notes (Signed)
Patient is discharged from room 3C04 at this time. Alert and in stable condition. IV site d/c'd and instructions read to patient with understanding verbalized. Left unit via wheelchair with all belongings at side. 

## 2019-12-13 NOTE — Anesthesia Postprocedure Evaluation (Signed)
Anesthesia Post Note  Patient: Breanna Davidson  Procedure(s) Performed: Lumbar four-five Posterior lumbar interbody fusion (N/A )     Patient location during evaluation: PACU Anesthesia Type: General Level of consciousness: awake and alert Pain management: pain level controlled Vital Signs Assessment: post-procedure vital signs reviewed and stable Respiratory status: spontaneous breathing, nonlabored ventilation, respiratory function stable and patient connected to nasal cannula oxygen Cardiovascular status: blood pressure returned to baseline and stable Postop Assessment: no apparent nausea or vomiting Anesthetic complications: no    Last Vitals:  Vitals:   12/13/19 0733 12/13/19 1150  BP: (!) 115/49 (!) 105/50  Pulse: 91 88  Resp: 18 18  Temp: 37.6 C 37.6 C  SpO2: 98% 97%    Last Pain:  Vitals:   12/13/19 1150  TempSrc: Oral  PainSc:                  Tiajuana Amass

## 2019-12-13 NOTE — Progress Notes (Signed)
Physical Therapy Treatment Patient Details Name: Breanna Davidson MRN: [id number] DOB: 1951-04-21 Today's Date: 12/13/2019    History of Present Illness Pt is a 69 y/o female s/p L4-5 PLIF. PMH includes vertigo and L THA.     PT Comments    Pt progressing well with post-op mobility. She was able to demonstrate transfers and ambulation with gross modified independence and no AD. Education reviewed on precautions, brace application/wearing schedule, appropriate activity progression, and car transfer. Will continue to follow.      Follow Up Recommendations  No PT follow up     Equipment Recommendations  None recommended by PT    Recommendations for Other Services       Precautions / Restrictions Precautions Precautions: Back Precaution Booklet Issued: Yes (comment) Precaution Comments: Reviewed back precautions with pt.  Required Braces or Orthoses: Spinal Brace Spinal Brace: Lumbar corset;Applied in sitting position Restrictions Weight Bearing Restrictions: No    Mobility  Bed Mobility Overal bed mobility: Needs Assistance Bed Mobility: Rolling;Sidelying to Sit Rolling: Modified independent (Device/Increase time) Sidelying to sit: Modified independent (Device/Increase time)     Sit to sidelying: Supervision General bed mobility comments: Pt was able to manage bed mobility well without cues for technique. Min use of railing but HOB flat and feel she could have managed without the rail if needed.   Transfers Overall transfer level: Modified independent Equipment used: None Transfers: Sit to/from Stand Sit to Stand: Supervision         General transfer comment: Pt was able to power up to full standing without assistance. No unsteadiness or LOB noted.   Ambulation/Gait Ambulation/Gait assistance: Modified independent (Device/Increase time) Gait Distance (Feet): 300 Feet Assistive device: None Gait Pattern/deviations: Step-through pattern;Decreased stride  length Gait velocity: Decreased Gait velocity interpretation: <1.8 ft/sec, indicate of risk for recurrent falls General Gait Details: Decreased gait speed but overall appeared steady and safe without an AD/UE support. Good posture noted.    Stairs Stairs: Yes Stairs assistance: Supervision Stair Management: One rail Right;Alternating pattern;Forwards Number of Stairs: 5 General stair comments: VC's for sequencing and general safety. No assist required.    Wheelchair Mobility    Modified Rankin (Stroke Patients Only)       Balance Overall balance assessment: Needs assistance Sitting-balance support: No upper extremity supported;Feet supported Sitting balance-Leahy Scale: Good     Standing balance support: During functional activity;No upper extremity supported Standing balance-Leahy Scale: Fair                              Cognition Arousal/Alertness: Awake/alert Behavior During Therapy: WFL for tasks assessed/performed Overall Cognitive Status: Within Functional Limits for tasks assessed                                        Exercises      General Comments        Pertinent Vitals/Pain Pain Assessment: Faces Faces Pain Scale: Hurts little more Pain Location: back at end of session after gait training Pain Descriptors / Indicators: Aching;Operative site guarding Pain Intervention(s): Limited activity within patient's tolerance;Monitored during session;Repositioned    Home Living Family/patient expects to be discharged to:: Private residence Living Arrangements: Spouse/significant other Available Help at Discharge: Family;Available 24 hours/day Type of Home: House Home Access: Stairs to enter Entrance Stairs-Rails: Can reach both;Left;Right Home Layout: One level Home Equipment:  Walker - 2 wheels;Bedside commode;Shower seat      Prior Function Level of Independence: Independent          PT Goals (current goals can now be  found in the care plan section) Acute Rehab PT Goals Patient Stated Goal: to go home PT Goal Formulation: With patient Time For Goal Achievement: 12/26/19 Potential to Achieve Goals: Good Progress towards PT goals: Progressing toward goals    Frequency    Min 5X/week      PT Plan Current plan remains appropriate    Co-evaluation              AM-PAC PT "6 Clicks" Mobility   Outcome Measure  Help needed turning from your back to your side while in a flat bed without using bedrails?: A Little Help needed moving from lying on your back to sitting on the side of a flat bed without using bedrails?: A Little Help needed moving to and from a bed to a chair (including a wheelchair)?: A Little Help needed standing up from a chair using your arms (e.g., wheelchair or bedside chair)?: A Little Help needed to walk in hospital room?: A Little Help needed climbing 3-5 steps with a railing? : A Little 6 Click Score: 18    End of Session Equipment Utilized During Treatment: Gait belt;Back brace Activity Tolerance: Patient tolerated treatment well Patient left: in bed;with call bell/phone within reach Nurse Communication: Mobility status PT Visit Diagnosis: Muscle weakness (generalized) (M62.81);Pain Pain - part of body: (back)     Time: [id number] PT Time Calculation (min) (ACUTE ONLY): 18 min  Charges:  $Gait Training: 8-22 mins                     Rolinda Roan, PT, DPT Acute Rehabilitation Services Pager: [phone] Office: [phone]    Thelma Comp 12/13/2019, 10:09 AM

## 2019-12-13 NOTE — Discharge Instructions (Signed)
Wound Care Leave incision open to air. You may shower. Do not scrub directly on incision.  Do not put any creams, lotions, or ointments on incision. Activity Walk each and every day, increasing distance each day. No lifting greater than 5 lbs.  Avoid bending, arching, and twisting. No driving for 2 weeks; may ride as a passenger locally. If provided with back brace, wear when out of bed.  It is not necessary to wear in bed. Diet Resume your normal diet.  Return to Work  [facility] [place] After Refer to this sheet in the next few weeks. These instructions provide you with information on caring for yourself after your procedure. Your caregiver may also give you more specific instructions. Your treatment has been planned according to current medical practices, but problems sometimes occur. Call your caregiver if you have any problems or questions after your procedure. HOME CARE INSTRUCTIONS   Take whatever pain medicine has been prescribed by your caregiver. Do not take over-the-counter pain medicine unless directed otherwise by your caregiver.   Do not drive if you are taking narcotic pain medicines.   Change your bandage (dressing) if necessary or as directed by your caregiver.   You may shower. The wound may get wet, simply pat the area dry. It will take ~2 weeks for the glue to peel off.  If you have been prescribed medicine to prevent your blood from clotting, follow the directions carefully.   Check the area around your incision often. Look for redness and swelling. Also, look for anything leaking from your wound. You can use a mirror or have a family member inspect your incision if it is in a place where it is difficult for you to see.   Ask your caregiver what activities you should avoid and for how long.   Walk as much as possible.   Do not lift anything heavier than 5 lbs until your caregiver says it is safe.   Do not twist or bend for a few weeks. Try not to pull on  things. Avoid sitting for long periods of time. Change positions at least every hour.   Will be discussed at you follow up appointment. Call Your Doctor If Any of These Occur Redness, drainage, or swelling at the wound.  Temperature greater than 101 degrees. Severe pain not relieved by pain medication. Incision starts to come apart. Follow Up Appt Call today for appointment in 2-4 weeks [id number]) or for problems.  If you have any hardware placed in your spine, you will need an x-ray before your appointment.

## 2019-12-13 NOTE — Discharge Summary (Signed)
Physician Discharge Summary  Patient ID: Breanna Davidson MRN: [id number] DOB/AGE: 11/07/1950 69 y.o.  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 Post op she has ambulated, voided, and tolerating a regular diet. Her wound is clean, dry, and without signs of infection   Treatments: surgery: Lumbar four-five Posterior lumbar interbody fusion with Rise interbody cages(globus), autograft morsels Lumbar laminectomy beyond the needed exposure for a plif, L4, L5 Posterolateral arthrodesis L4/5 with autograft morsels Non segmental pedicle screw instrumentation Stryker Xia screws     Discharge Exam: Blood pressure (!) 105/50, pulse 88, temperature 99.7 F (37.6 C), temperature source Oral, resp. rate 18, height 5\' 2"  (1.575 m), weight 74.8 kg, SpO2 97 %. General appearance: alert, cooperative, appears stated age and no distress Neurologic: Alert and oriented X 3, normal strength and tone. Normal symmetric reflexes. Normal coordination and gait  Disposition: Discharge disposition: 01-Home or Self Care      Spondylolisthesis, Lumbar region  Allergies as of 12/13/2019      Reactions   Prednisone Other (See Comments)   Altered Mental Status      Medication List    TAKE these medications   CALCIUM-MAGNESIUM PO Take 1 tablet by mouth daily.   CO Q-10 PLUS RED YEAST RICE PO Take 1 capsule by mouth daily.   COLLAGEN EX Take 1 Scoop by mouth daily. Mix with coffee   Combigan 0.2-0.5 % ophthalmic solution Generic drug: brimonidine-timolol Place 1 drop into both eyes 2 (two) times daily.    DULoxetine 30 MG capsule Commonly known as: CYMBALTA Take 1 capsule (30 mg total) by mouth daily.   Fish Oil 1000 MG Caps Take 1,000 mg by mouth daily.   oxyCODONE 5 MG immediate release tablet Commonly known as: Oxy IR/ROXICODONE Take 1 tablet (5 mg total) by mouth every 6 (six) hours as needed for up to 7 days for moderate pain ((score 4 to 6)).   tiZANidine 4 MG tablet Commonly known as: ZANAFLEX Take 1 tablet (4 mg total) by mouth every 6 (six) hours as needed for muscle spasms.   Vitamin D 50 MCG (2000 UT) Caps Take 2,000 Units by mouth daily.        SignedAshok Pall 12/13/2019, 12:22 PM

## 2019-12-13 NOTE — Evaluation (Addendum)
Occupational Therapy Evaluation and Discharge Patient Details Name: Breanna Davidson MRN: [id number] DOB: [birth year] Today's Date: 12/13/2019    History of Present Illness Pt is a 69 y/o female s/p L4-5 PLIF. PMH includes vertigo and L THA.    Clinical Impression   PTA patient independent. Admitted for above and limited by problem list below, including back pain and precautions. Patient educated on precautions, brace mgmt and wear schedule, ADL compensatory techniques, recommendations, safety and DME.  Patient completing ADLs with supervision, able to manage brace with supervision, in room mobility and transfers with supervision.  Good adherence to precautions, min cueing during bed mobility only.  Will have support of spouse at home as needed.  No further OT needs identified. OT will sign off.     Follow Up Recommendations  No OT follow up    Equipment Recommendations  None recommended by OT    Recommendations for Other Services       Precautions / Restrictions Precautions Precautions: Back Precaution Booklet Issued: Yes (comment) Precaution Comments: Reviewed back precautions with pt.  Required Braces or Orthoses: Spinal Brace Spinal Brace: Lumbar corset;Applied in sitting position Restrictions Weight Bearing Restrictions: No      Mobility Bed Mobility Overal bed mobility: Needs Assistance Bed Mobility: Rolling;Sidelying to Sit;Sit to Sidelying Rolling: Supervision Sidelying to sit: Supervision     Sit to sidelying: Supervision General bed mobility comments: supervision for adherence to back precautions and to ensure log rolling technique  Transfers Overall transfer level: Needs assistance Equipment used: None Transfers: Sit to/from Stand Sit to Stand: Supervision         General transfer comment: supervision for safety, good awareness to safety and posture     Balance Overall balance assessment: Needs assistance Sitting-balance support: No upper extremity  supported;Feet supported Sitting balance-Leahy Scale: Good     Standing balance support: During functional activity;No upper extremity supported Standing balance-Leahy Scale: Fair                             ADL either performed or assessed with clinical judgement   ADL Overall ADL's : Needs assistance/impaired     Grooming: Modified independent;Standing   Upper Body Bathing: Supervision/ safety;Sitting   Lower Body Bathing: Supervison/ safety;Sit to/from stand;Cueing for back precautions;Cueing for compensatory techniques   Upper Body Dressing : Supervision/safety;Sitting   Lower Body Dressing: Supervision/safety;Cueing for compensatory techniques;Sit to/from stand;Cueing for back precautions Lower Body Dressing Details (indicate cue type and reason): able to complete figure 4 technique with supervision Toilet Transfer: Supervision/safety;Ambulation Toilet Transfer Details (indicate cue type and reason): simulated in room     Tub/ Shower Transfer: Walk-in shower;Supervision/safety;Ambulation;Shower Scientist, research (medical) Details (indicate cue type and reason): for safety, supervision  Functional mobility during ADLs: Supervision/safety General ADL Comments: supervision for safety, educated on back precautions, ADL compensatory techniques and safety      Vision         Perception     Praxis      Pertinent Vitals/Pain Pain Assessment: Faces Faces Pain Scale: Hurts little more Pain Location: back Pain Descriptors / Indicators: Aching;Operative site guarding Pain Intervention(s): Monitored during session;Repositioned     Hand Dominance     Extremity/Trunk Assessment Upper Extremity Assessment Upper Extremity Assessment: Overall WFL for tasks assessed   Lower Extremity Assessment Lower Extremity Assessment: Defer to PT evaluation   Cervical / Trunk Assessment Cervical / Trunk Assessment: Other exceptions Cervical / Trunk Exceptions: s/p PLIF  Communication Communication Communication: No difficulties   Cognition Arousal/Alertness: Awake/alert Behavior During Therapy: WFL for tasks assessed/performed Overall Cognitive Status: Within Functional Limits for tasks assessed                                     General Comments       Exercises     Shoulder Instructions      Home Living Family/patient expects to be discharged to:: Private residence Living Arrangements: Spouse/significant other Available Help at Discharge: Family;Available 24 hours/day Type of Home: House Home Access: Stairs to enter [employer] of Steps: 3 Entrance Stairs-Rails: Can reach both;Left;Right Home Layout: One level     Bathroom Shower/Tub: Occupational psychologist: Handicapped height     Home Equipment: Environmental consultant - 2 wheels;Bedside commode;Shower seat          Prior Functioning/Environment Level of Independence: Independent                 OT Problem List: Decreased activity tolerance;Decreased knowledge of use of DME or AE;Decreased knowledge of precautions;Pain      OT Treatment/Interventions:      OT Goals(Current goals can be found in the care plan section) Acute Rehab OT Goals Patient Stated Goal: to go home OT Goal Formulation: With patient  OT Frequency:     Barriers to D/C:            Co-evaluation              AM-PAC OT "6 Clicks" Daily Activity     Outcome Measure Help from another person eating meals?: None Help from another person taking care of personal grooming?: None Help from another person toileting, which includes using toliet, bedpan, or urinal?: None Help from another person bathing (including washing, rinsing, drying)?: None Help from another person to put on and taking off regular upper body clothing?: None Help from another person to put on and taking off regular lower body clothing?: None 6 Click Score: 24   End of Session Equipment Utilized During  Treatment: Back brace Nurse Communication: Mobility status  Activity Tolerance: Patient tolerated treatment well Patient left: in bed;with call bell/phone within reach  OT Visit Diagnosis: Pain;Other abnormalities of gait and mobility (R26.89) Pain - part of body: (back)                Time: [id number] OT Time Calculation (min): 15 min Charges:  OT General Charges $OT Visit: 1 Visit OT Evaluation $OT Eval Low Complexity: 1 Low  Jolaine Artist, OT Acute Rehabilitation Services Pager [phone] Office [phone]   Delight Stare 12/13/2019, 8:44 AM

## 2019-12-14 ENCOUNTER — Encounter: Payer: Self-pay | Admitting: *Deleted

## 2020-01-03 DIAGNOSIS — M5126 Other intervertebral disc displacement, lumbar region: Secondary | ICD-10-CM | POA: Diagnosis not present

## 2020-01-24 DIAGNOSIS — H40053 Ocular hypertension, bilateral: Secondary | ICD-10-CM | POA: Diagnosis not present

## 2020-01-29 ENCOUNTER — Telehealth (INDEPENDENT_AMBULATORY_CARE_PROVIDER_SITE_OTHER): Payer: Medicare Other | Admitting: Nurse Practitioner

## 2020-01-29 ENCOUNTER — Encounter: Payer: Self-pay | Admitting: Nurse Practitioner

## 2020-01-29 ENCOUNTER — Telehealth: Payer: Self-pay | Admitting: Nurse Practitioner

## 2020-01-29 VITALS — BP 120/79 | HR 83 | Wt 164.0 lb

## 2020-01-29 DIAGNOSIS — Z981 Arthrodesis status: Secondary | ICD-10-CM

## 2020-01-29 DIAGNOSIS — F330 Major depressive disorder, recurrent, mild: Secondary | ICD-10-CM | POA: Diagnosis not present

## 2020-01-29 DIAGNOSIS — E7800 Pure hypercholesterolemia, unspecified: Secondary | ICD-10-CM | POA: Diagnosis not present

## 2020-01-29 NOTE — Assessment & Plan Note (Signed)
Chronic, improved with surgical interventions for chronic pain.  Continue Duloxetine 60 MG and adjust as needed, may be able to reduce in future now that pain is improved.  Return in 6 months for annual physical.

## 2020-01-29 NOTE — Assessment & Plan Note (Signed)
Improved pain post-surgery with improved mobility.  Continue collaboration with neurosurgery as needed.

## 2020-01-29 NOTE — Progress Notes (Signed)
BP 120/79   Pulse 83   Wt 164 lb (74.4 kg)   LMP  (LMP Unknown)   BMI 30.00 kg/m    Subjective:    Patient ID: Breanna Davidson, female    DOB: January 11, 1951, 69 y.o.   MRN: [id number]  HPI: Breanna Davidson is a 69 y.o. female  Chief Complaint  Patient presents with  . Hyperlipidemia  . Depression    . This visit was completed via telephone due to the restrictions of the COVID-19 pandemic. All issues as above were discussed and addressed but no physical exam was performed. If it was felt that the patient should be evaluated in the office, they were directed there. The patient verbally consented to this visit. Patient was unable to complete an audio/visual visit due to Technical difficulties,Lack of internet. Due to the catastrophic nature of the COVID-19 pandemic, this visit was done through audio contact only.  She was unable to connect to [place]. . Location of the patient: home . Location of the provider: home . Those involved with this call:  . Provider: Marnee Guarneri, DNP . CMA: Yvonna Alanis, CMA . Front Desk/Registration: Don Perking  . Time spent on call: 15 minutes on the phone discussing health concerns. 10 minutes total spent in review of patient's record and preparation of their chart.  . I verified patient identity using two factors (patient name and date of birth). Patient consents verbally to being seen via telemedicine visit today.    HYPERLIPIDEMIA Does not wish to take statin, continues on red yeast rice and fish oil. Hyperlipidemia status: good compliance Satisfied with current treatment?  yes Supplements: fish oil and red yeast rice Aspirin:  no The 10-year ASCVD risk score [name] DC Jr., et al., 2013) is: 7%   Values used to calculate the score:     Age: 59 years     Sex: Female     Is Non-Hispanic African American: No     Diabetic: No     Tobacco smoker: No     Systolic Blood Pressure: 123456 mmHg     Is BP treated: No     HDL  Cholesterol: 77 mg/dL     Total Cholesterol: 276 mg/dL  DEPRESSION Continues on Cymbalta, which she reports has improved mood and offered some help with pain. Reports wishes to stay at current dose.  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  She reports feeling much better, much less pain, able to walk without difficulty.  Surgeries performed by Dr. Christella Noa. Mood status: stable Satisfied with current treatment?: yes Symptom severity: mild  Duration of current treatment : chronic Side effects: no Medication compliance: good compliance Psychotherapy/counseling: none Depressed mood: none Anxious mood: no Anhedonia: no Significant weight loss or gain: no Insomnia: yes hard to stay asleep at times Fatigue: yes Feelings of worthlessness or guilt: no Impaired concentration/indecisiveness: none Suicidal ideations: no Hopelessness: no Crying spells: no Depression screen [facility] 2/9 01/29/2020 08/20/2019 05/17/2019 02/13/2019 12/13/2018  Decreased Interest 0 0 0 0 2  Down, Depressed, Hopeless 0 0 0 0 2  PHQ - 2 Score 0 0 0 0 4  Altered sleeping 1 - 0 0 1  Tired, decreased energy 0 - 0 2 0  Change in appetite 0 - 0 0 0  Feeling bad or failure about yourself  0 - 0 0 0  Trouble concentrating 0 - 0 0 0  Moving slowly or  fidgety/restless 0 - - 0 0  Suicidal thoughts 0 - 0 0 0  PHQ-9 Score 1 - 0 2 5  Difficult doing work/chores Not difficult at all - - - Somewhat difficult    Relevant past medical, surgical, family and social history reviewed and updated as indicated. Interim medical history since our last visit reviewed. Allergies and medications reviewed and updated.  Review of Systems  Constitutional: Negative for activity change, appetite change, diaphoresis, fatigue and fever.  Respiratory: Negative for cough, chest tightness and shortness of breath.   Cardiovascular: Negative for chest pain, palpitations and  leg swelling.  Gastrointestinal: Negative.   Musculoskeletal: Negative for arthralgias.  Neurological: Negative.   Psychiatric/Behavioral: Negative.     Per HPI unless specifically indicated above     Objective:    BP 120/79   Pulse 83   Wt 164 lb (74.4 kg)   LMP  (LMP Unknown)   BMI 30.00 kg/m   Wt Readings from Last 3 Encounters:  01/29/20 164 lb (74.4 kg)  12/12/19 165 lb (74.8 kg)  12/10/19 169 lb 12.8 oz (77 kg)    Physical Exam   Unable to perform due to telephone visit only  Results for orders placed or performed during the hospital encounter of 12/10/19  SARS CORONAVIRUS 2 (TAT 6-24 HRS) Nasopharyngeal Nasopharyngeal Swab   Specimen: Nasopharyngeal Swab  Result Value Ref Range   SARS Coronavirus 2 NEGATIVE NEGATIVE  ABO/Rh  Result Value Ref Range   ABO/RH(D)      A POS Performed at [facility], [place] [address], [place], [place] [address]       Assessment & Plan:   Problem List Items Addressed This Visit      Other   Hypercholesteremia    Ongoing, refuses statin.  Will plan on recheck lipid panel in October at annual physical.  Continue red yeast rice and diet focus.      Depression - Primary    Chronic, improved with surgical interventions for chronic pain.  Continue Duloxetine 60 MG and adjust as needed, may be able to reduce in future now that pain is improved.  Return in 6 months for annual physical.      History of lumbar fusion    Improved pain post-surgery with improved mobility.  Continue collaboration with neurosurgery as needed.         I discussed the assessment and treatment plan with the patient. The patient was provided an opportunity to ask questions and all were answered. The patient agreed with the plan and demonstrated an understanding of the instructions.   The patient was advised to call back or seek an in-person evaluation if the symptoms worsen or if the condition fails to improve as anticipated.   I provided 15+  minutes of time during this encounter.  Follow up plan: Return in about 6 months (around 07/30/2020) for Annual physical.

## 2020-01-29 NOTE — Patient Instructions (Signed)
Preventing High Cholesterol Cholesterol is a white, waxy substance similar to fat that the human body needs to help build cells. The liver makes all the cholesterol that a person's body needs. Having high cholesterol (hypercholesterolemia) increases a person's risk for heart disease and stroke. Extra (excess) cholesterol comes from the food the person eats. High cholesterol can often be prevented with diet and lifestyle changes. If you already have high cholesterol, you can control it with diet and lifestyle changes and with medicine. How can high cholesterol affect me? If you have high cholesterol, deposits (plaques) may build up on the walls of your arteries. The arteries are the blood vessels that carry blood away from your heart. Plaques make the arteries narrower and stiffer. This can limit or block blood flow and cause blood clots to form. Blood clots:  Are tiny balls of cells that form in your blood.  Can move to the heart or brain, causing a heart attack or stroke. Plaques in arteries greatly increase your risk for heart attack and stroke.Making diet and lifestyle changes can reduce your risk for these conditions that may threaten your life. What can increase my risk? This condition is more likely to develop in people who:  Eat foods that are high in saturated fat or cholesterol. Saturated fat is mostly found in: ? Foods that contain animal fat, such as red meat and some dairy products. ? Certain fatty foods made from plants, such as tropical oils.  Are overweight.  Are not getting enough exercise.  Have a family history of high cholesterol. What actions can I take to prevent this? Nutrition   Eat less saturated fat.  Avoid trans fats (partially hydrogenated oils). These are often found in margarine and in some baked goods, fried foods, and snacks bought in packages.  Avoid precooked or cured meat, such as sausages or meat loaves.  Avoid foods and drinks that have added  sugars.  Eat more fruits, vegetables, and whole grains.  Choose healthy sources of protein, such as fish, poultry, lean cuts of red meat, beans, peas, lentils, and nuts.  Choose healthy sources of fat, such as: ? Nuts. ? Vegetable oils, especially olive oil. ? Fish that have healthy fats (omega-3 fatty acids), such as mackerel or salmon. The items listed above may not be a complete list of recommended foods and beverages. Contact a dietitian for more information. Lifestyle  Lose weight if you are overweight. Losing 5-10 lb (2.3-4.5 kg) can help prevent or control high cholesterol. It can also lower your risk for diabetes and high blood pressure. Ask your health care provider to help you with a diet and exercise plan to lose weight safely.  Do not use any products that contain nicotine or tobacco, such as cigarettes, e-cigarettes, and chewing tobacco. If you need help quitting, ask your health care provider.  Limit your alcohol intake. ? Do not drink alcohol if:  Your health care provider tells you not to drink.  You are pregnant, may be pregnant, or are planning to become pregnant. ? If you drink alcohol:  Limit how much you use to:  0-1 drink a day for women.  0-2 drinks a day for men.  Be aware of how much alcohol is in your drink. In the [place] one drink equals one 12 oz bottle of beer (355 mL), one 5 oz glass of wine (148 mL), or one 1 oz glass of hard liquor (44 mL). Activity   Get enough exercise. Each week, do at   least 150 minutes of exercise that takes a medium level of effort (moderate-intensity exercise). ? This is exercise that:  Makes your heart beat faster and makes you breathe harder than usual.  Allows you to still be able to talk. ? You could exercise in short sessions several times a day or longer sessions a few times a week. For example, on 5 days each week, you could walk fast or ride your bike 3 times a day for 10 minutes each time.  Do exercises as told  by your health care provider. Medicines  In addition to diet and lifestyle changes, your health care provider may recommend medicines to help lower cholesterol. This may be a medicine to lower the amount of cholesterol your liver makes. You may need medicine if: ? Diet and lifestyle changes do not lower your cholesterol enough. ? You have high cholesterol and other risk factors for heart disease or stroke.  Take over-the-counter and prescription medicines only as told by your health care provider. General information  Manage your risk factors for high cholesterol. Talk with your health care provider about all your risk factors and how to lower your risk.  Manage other conditions that you have, such as diabetes or high blood pressure (hypertension).  Have blood tests to check your cholesterol levels at regular points in time as told by your health care provider.  Keep all follow-up visits as told by your health care provider. This is important. Where to find more information  American Heart Association: www.heart.org  National Heart, Lung, and Blood Institute: www.nhlbi.nih.gov Summary  High cholesterol increases your risk for heart disease and stroke. By keeping your cholesterol level low, you can reduce your risk for these conditions.  High cholesterol can often be prevented with diet and lifestyle changes.  Work with your health care provider to manage your risk factors, and have your blood tested regularly. This information is not intended to replace advice given to you by your health care provider. Make sure you discuss any questions you have with your health care provider. Document Revised: 02/02/2019 Document Reviewed: 06/19/2016 Elsevier Patient Education  2020 Elsevier Inc.  

## 2020-01-29 NOTE — Assessment & Plan Note (Signed)
Ongoing, refuses statin.  Will plan on recheck lipid panel in October at annual physical.  Continue red yeast rice and diet focus.

## 2020-01-29 NOTE — Telephone Encounter (Signed)
lvm to schedule physical in 6 months,sent letter.

## 2020-01-29 NOTE — Telephone Encounter (Signed)
-----   Message from Venita Lick, NP sent at 01/29/2020  9:44 AM EDT ----- 6 months for annual physical

## 2020-01-30 NOTE — Telephone Encounter (Signed)
Has apt on 07/30/2020

## 2020-04-05 ENCOUNTER — Other Ambulatory Visit: Payer: Self-pay | Admitting: Nurse Practitioner

## 2020-04-05 NOTE — Telephone Encounter (Signed)
Requested Prescriptions  Pending Prescriptions Disp Refills  . DULoxetine (CYMBALTA) 30 MG capsule [Pharmacy Med Name: DULOXETINE HCL 30 MG CAP] 90 capsule 3    Sig: TAKE 1 CAPSULE EVERY DAY     Psychiatry: Antidepressants - SNRI Passed - 04/05/2020  1:24 PM      Passed - Completed PHQ-2 or PHQ-9 in the last 360 days.      Passed - Last BP in normal range    BP Readings from Last 1 Encounters:  01/29/20 120/79         Passed - Valid encounter within last 6 months    Recent Outpatient Visits          2 months ago Mild episode of recurrent major depressive disorder ([place])   [place] Cannady, Barbaraann Faster, NP   8 months ago Need for influenza vaccination   [facility] Marnee Guarneri T, NP   10 months ago Pre-op testing   [place], [place]   10 months ago Mild episode of recurrent major depressive disorder ([place])   [place] Cannady, Jolene T, NP   1 year ago Mild episode of recurrent major depressive disorder ([place])   [place], Barbaraann Faster, NP      Future Appointments            In 3 months Cannady, Barbaraann Faster, NP [employer]

## 2020-05-12 DIAGNOSIS — H1013 Acute atopic conjunctivitis, bilateral: Secondary | ICD-10-CM | POA: Diagnosis not present

## 2020-06-27 DIAGNOSIS — H1013 Acute atopic conjunctivitis, bilateral: Secondary | ICD-10-CM | POA: Diagnosis not present

## 2020-07-03 DIAGNOSIS — M4316 Spondylolisthesis, lumbar region: Secondary | ICD-10-CM | POA: Diagnosis not present

## 2020-07-04 DIAGNOSIS — H1013 Acute atopic conjunctivitis, bilateral: Secondary | ICD-10-CM | POA: Diagnosis not present

## 2020-07-16 DIAGNOSIS — M1612 Unilateral primary osteoarthritis, left hip: Secondary | ICD-10-CM | POA: Diagnosis not present

## 2020-07-23 DIAGNOSIS — H40039 Anatomical narrow angle, unspecified eye: Secondary | ICD-10-CM | POA: Diagnosis not present

## 2020-07-27 ENCOUNTER — Encounter: Payer: Self-pay | Admitting: Nurse Practitioner

## 2020-07-27 DIAGNOSIS — I700 Atherosclerosis of aorta: Secondary | ICD-10-CM | POA: Insufficient documentation

## 2020-07-30 ENCOUNTER — Ambulatory Visit (INDEPENDENT_AMBULATORY_CARE_PROVIDER_SITE_OTHER): Payer: Medicare Other | Admitting: Nurse Practitioner

## 2020-07-30 ENCOUNTER — Other Ambulatory Visit: Payer: Self-pay

## 2020-07-30 ENCOUNTER — Encounter: Payer: Self-pay | Admitting: Nurse Practitioner

## 2020-07-30 VITALS — BP 103/66 | HR 57 | Temp 98.6°F | Ht 62.0 in | Wt 169.2 lb

## 2020-07-30 DIAGNOSIS — I700 Atherosclerosis of aorta: Secondary | ICD-10-CM | POA: Diagnosis not present

## 2020-07-30 DIAGNOSIS — F330 Major depressive disorder, recurrent, mild: Secondary | ICD-10-CM | POA: Diagnosis not present

## 2020-07-30 DIAGNOSIS — E7800 Pure hypercholesterolemia, unspecified: Secondary | ICD-10-CM | POA: Diagnosis not present

## 2020-07-30 DIAGNOSIS — Z1211 Encounter for screening for malignant neoplasm of colon: Secondary | ICD-10-CM

## 2020-07-30 DIAGNOSIS — E663 Overweight: Secondary | ICD-10-CM | POA: Diagnosis not present

## 2020-07-30 MED ORDER — CITALOPRAM HYDROBROMIDE 10 MG PO TABS: 10.0000 mg | ORAL_TABLET | Freq: Every day | ORAL | 4 refills | Status: DC

## 2020-07-30 NOTE — Progress Notes (Signed)
BP 103/66 (BP Location: Left Arm, Patient Position: Sitting, Cuff Size: Normal)   Pulse (!) 57   Temp 98.6 F (37 C) (Oral)   Ht 5\' 2"  (1.575 m)   Wt 169 lb 3.2 oz (76.7 kg)   LMP  (LMP Unknown)   SpO2 98%   BMI 30.95 kg/m    Subjective:    Patient ID: Breanna [place], female    DOB: [birth year], 69 y.o.   MRN: [id number]  HPI: Breanna [place] is a 69 y.o. female  Chief Complaint  Patient presents with  . Depression    patient reports reaction to medication, reports sweating a lot.  . Hyperlipidemia   HYPERLIPIDEMIA Does not wish to take statin, continues on red yeast rice and fish oil. Hyperlipidemia status:good compliance Satisfied with current treatment?yes Supplements:fish oil and red yeast rice Aspirin:no The 10-year ASCVD risk score [name] DC Jr., et al., 2013) is: 5.8%   Values used to calculate the score:     Age: 89 years     Sex: Female     Is Non-Hispanic African American: No     Diabetic: No     Tobacco smoker: No     Systolic Blood Pressure: 622 mmHg     Is BP treated: No     HDL Cholesterol: 77 mg/dL     Total Cholesterol: 276 mg/dL   DEPRESSION Continues on Cymbalta, which she reports has improved mood, but reports is causing sweats. Would like to switch to alternate regimen.  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  She reports feeling much better, much less pain, able to walk without difficulty.  Surgeries performed by Dr. Christella Noa. Mood status:stable Satisfied with current treatment?:yes Symptom severity:mild Duration of current treatment :chronic Side effects:sweating Medication compliance:good compliance Psychotherapy/counseling:none Depressed mood:none Anxious mood:no Anhedonia:no Significant weight loss or gain:no Insomnia:yeshard to stay asleep at times Fatigue:yes Feelings of worthlessness or guilt:no Impaired  concentration/indecisiveness:none Suicidal ideations:no Hopelessness:no Crying spells:no Depression screen [facility] 2/9 07/30/2020 01/29/2020 08/20/2019 05/17/2019 02/13/2019  Decreased Interest 0 0 0 0 0  Down, Depressed, Hopeless 0 0 0 0 0  PHQ - 2 Score 0 0 0 0 0  Altered sleeping 0 1 - 0 0  Tired, decreased energy - 0 - 0 2  Change in appetite 0 0 - 0 0  Feeling bad or failure about yourself  0 0 - 0 0  Trouble concentrating 0 0 - 0 0  Moving slowly or fidgety/restless 0 0 - - 0  Suicidal thoughts 0 0 - 0 0  PHQ-9 Score 0 1 - 0 2  Difficult doing work/chores Not difficult at all Not difficult at all - - -    Relevant past medical, surgical, family and social history reviewed and updated as indicated. Interim medical history since our last visit reviewed. Allergies and medications reviewed and updated.  Review of Systems  Constitutional: Negative for activity change, appetite change, diaphoresis, fatigue and fever.  Respiratory: Negative for cough, chest tightness and shortness of breath.   Cardiovascular: Negative for chest pain, palpitations and leg swelling.  Gastrointestinal: Negative.   Musculoskeletal: Negative for arthralgias.  Neurological: Negative.   Psychiatric/Behavioral: Negative.     Per HPI unless specifically indicated above     Objective:    BP 103/66 (BP Location: Left Arm, Patient Position: Sitting, Cuff Size: Normal)   Pulse (!) 57   Temp 98.6 F (37 C) (Oral)  Ht 5\' 2"  (1.575 m)   Wt 169 lb 3.2 oz (76.7 kg)   LMP  (LMP Unknown)   SpO2 98%   BMI 30.95 kg/m   Wt Readings from Last 3 Encounters:  07/30/20 169 lb 3.2 oz (76.7 kg)  01/29/20 164 lb (74.4 kg)  12/12/19 165 lb (74.8 kg)    Physical Exam Vitals and nursing note reviewed.  Constitutional:      General: She is awake. She is not in acute distress.    Appearance: She is well-developed and well-groomed. She is obese. She is not ill-appearing.  HENT:     Head: Normocephalic.     Right  Ear: Hearing normal.     Left Ear: Hearing normal.  Eyes:     General: Lids are normal.        Right eye: No discharge.        Left eye: No discharge.     Conjunctiva/sclera: Conjunctivae normal.     Pupils: Pupils are equal, round, and reactive to light.  Neck:     Thyroid: No thyromegaly.     Vascular: No carotid bruit.  Cardiovascular:     Rate and Rhythm: Normal rate and regular rhythm.     Heart sounds: Normal heart sounds. No murmur heard.  No gallop.   Pulmonary:     Effort: Pulmonary effort is normal. No accessory muscle usage or respiratory distress.     Breath sounds: Normal breath sounds.  Abdominal:     General: Bowel sounds are normal.     Palpations: Abdomen is soft. There is no hepatomegaly or splenomegaly.  Musculoskeletal:     Cervical back: Normal range of motion and neck supple.     Right lower leg: No edema.     Left lower leg: No edema.  Skin:    General: Skin is warm and dry.  Neurological:     Mental Status: She is alert and oriented to person, place, and time.  Psychiatric:        Attention and Perception: Attention normal.        Mood and Affect: Mood normal.        Speech: Speech normal.        Behavior: Behavior normal. Behavior is cooperative.        Thought Content: Thought content normal.     Results for orders placed or performed during the hospital encounter of 12/10/19  SARS CORONAVIRUS 2 (TAT 6-24 HRS) Nasopharyngeal Nasopharyngeal Swab   Specimen: Nasopharyngeal Swab  Result Value Ref Range   SARS Coronavirus 2 NEGATIVE NEGATIVE  ABO/Rh  Result Value Ref Range   ABO/RH(D)      A POS Performed at [facility], [place] [address], [place], [place] [address]       Assessment & Plan:   Problem List Items Addressed This Visit      Cardiovascular and Mediastinum   Aortic atherosclerosis ([place])    Noted on lumbar imaging in October 2019.  Continue focus on healthy diet and exercise for prevention.  Does not wish to start statin or  ASA.        Other   Hypercholesteremia    Ongoing, refuses statin.  ASCVD 7.8%.  Continue red yeast rice and diet focus.  Return as scheduled in November.      Depression - Primary    Chronic, ongoing, would like to trial change to alternate medication now that pain improved.  Will trial switch from Duloxetine 30 MG daily  to Celexa 10 MG daily, discussed with patient and educated her on this -- she will complete Duloxetine first and slowly transition.  Script sent.  Return as scheduled in November.      Relevant Medications   citalopram (CELEXA) 10 MG tablet   Overweight    Recommended eating smaller high protein, low fat meals more frequently and exercising 30 mins a day 5 times a week with a goal of 10-15lb weight loss in the next 3 months. Patient voiced their understanding and motivation to adhere to these recommendations.        Other Visit Diagnoses    Colon cancer screening       GI referral in place   Relevant Orders   Ambulatory referral to Gastroenterology       Follow up plan: Return for return as scheduled in November for physical.

## 2020-07-30 NOTE — Patient Instructions (Signed)
Living With Depression Everyone experiences occasional disappointment, sadness, and loss in their lives. When you are feeling down, blue, or sad for at least 2 weeks in a row, it may mean that you have depression. Depression can affect your thoughts and feelings, relationships, daily activities, and physical health. It is caused by changes in the way your brain functions. If you receive a diagnosis of depression, your health care provider will tell you which type of depression you have and what treatment options are available to you. If you are living with depression, there are ways to help you recover from it and also ways to prevent it from coming back. How to cope with lifestyle changes Coping with stress     Stress is your body's reaction to life changes and events, both good and bad. Stressful situations may include:  Getting married.  The death of a spouse.  Losing a job.  Retiring.  Having a baby. Stress can last just a few hours or it can be ongoing. Stress can play a major role in depression, so it is important to learn both how to cope with stress and how to think about it differently. Talk with your health care provider or a counselor if you would like to learn more about stress reduction. He or she may suggest some stress reduction techniques, such as:  Music therapy. This can include creating music or listening to music. Choose music that you enjoy and that inspires you.  Mindfulness-based meditation. This kind of meditation can be done while sitting or walking. It involves being aware of your normal breaths, rather than trying to control your breathing.  Centering prayer. This is a kind of meditation that involves focusing on a spiritual word or phrase. Choose a word, phrase, or sacred image that is meaningful to you and that brings you peace.  Deep breathing. To do this, expand your stomach and inhale slowly through your nose. Hold your breath for 3-5 seconds, then exhale  slowly, allowing your stomach muscles to relax.  Muscle relaxation. This involves intentionally tensing muscles then relaxing them. Choose a stress reduction technique that fits your lifestyle and personality. Stress reduction techniques take time and practice to develop. Set aside 5-15 minutes a day to do them. Therapists can offer training in these techniques. The training may be covered by some insurance plans. Other things you can do to manage stress include:  Keeping a stress diary. This can help you learn what triggers your stress and ways to control your response.  Understanding what your limits are and saying no to requests or events that lead to a schedule that is too full.  Thinking about how you respond to certain situations. You may not be able to control everything, but you can control how you react.  Adding humor to your life by watching funny films or TV shows.  Making time for activities that help you relax and not feeling guilty about spending your time this way.  Medicines Your health care provider may suggest certain medicines if he or she feels that they will help improve your condition. Avoid using alcohol and other substances that may prevent your medicines from working properly (may interact). It is also important to:  Talk with your pharmacist or health care provider about all the medicines that you take, their possible side effects, and what medicines are safe to take together.  Make it your goal to take part in all treatment decisions (shared decision-making). This includes giving input on   the side effects of medicines. It is best if shared decision-making with your health care provider is part of your total treatment plan. If your health care provider prescribes a medicine, you may not notice the full benefits of it for 4-8 weeks. Most people who are treated for depression need to be on medicine for at least 6-12 months after they feel better. If you are taking  medicines as part of your treatment, do not stop taking medicines without first talking to your health care provider. You may need to have the medicine slowly decreased (tapered) over time to decrease the risk of harmful side effects. Relationships Your health care provider may suggest family therapy along with individual therapy and drug therapy. While there may not be family problems that are causing you to feel depressed, it is still important to make sure your family learns as much as they can about your mental health. Having your family's support can help make your treatment successful. How to recognize changes in your condition Everyone has a different response to treatment for depression. Recovery from major depression happens when you have not had signs of major depression for two months. This may mean that you will start to:  Have more interest in doing activities.  Feel less hopeless than you did 2 months ago.  Have more energy.  Overeat less often, or have better or improving appetite.  Have better concentration. Your health care provider will work with you to decide the next steps in your recovery. It is also important to recognize when your condition is getting worse. Watch for these signs:  Having fatigue or low energy.  Eating too much or too little.  Sleeping too much or too little.  Feeling restless, agitated, or hopeless.  Having trouble concentrating or making decisions.  Having unexplained physical complaints.  Feeling irritable, angry, or aggressive. Get help as soon as you or your family members notice these symptoms coming back. How to get support and help from others How to talk with friends and family members about your condition  Talking to friends and family members about your condition can provide you with one way to get support and guidance. Reach out to trusted friends or family members, explain your symptoms to them, and let them know that you are  working with a health care provider to treat your depression. Financial resources Not all insurance plans cover mental health care, so it is important to check with your insurance carrier. If paying for co-pays or counseling services is a problem, search for a local or county mental health care center. They may be able to offer public mental health care services at low or no cost when you are not able to see a private health care provider. If you are taking medicine for depression, you may be able to get the generic form, which may be less expensive. Some makers of prescription medicines also offer help to patients who cannot afford the medicines they need. Follow these instructions at home:   Get the right amount and quality of sleep.  Cut down on using caffeine, tobacco, alcohol, and other potentially harmful substances.  Try to exercise, such as walking or lifting small weights.  Take over-the-counter and prescription medicines only as told by your health care provider.  Eat a healthy diet that includes plenty of vegetables, fruits, whole grains, low-fat dairy products, and lean protein. Do not eat a lot of foods that are high in solid fats, added sugars, or salt.    Keep all follow-up visits as told by your health care provider. This is important. Contact a health care provider if:  You stop taking your antidepressant medicines, and you have any of these symptoms: ? Nausea. ? Headache. ? Feeling lightheaded. ? Chills and body aches. ? Not being able to sleep (insomnia).  You or your friends and family think your depression is getting worse. Get help right away if:  You have thoughts of hurting yourself or others. If you ever feel like you may hurt yourself or others, or have thoughts about taking your own life, get help right away. You can go to your nearest emergency department or call:  Your local emergency services (911 in the [place]  A suicide crisis helpline, such as the  National Suicide Prevention Lifeline at 1-800-273-8255. This is open 24-hours a day. Summary  If you are living with depression, there are ways to help you recover from it and also ways to prevent it from coming back.  Work with your health care team to create a management plan that includes counseling, stress management techniques, and healthy lifestyle habits. This information is not intended to replace advice given to you by your health care provider. Make sure you discuss any questions you have with your health care provider. Document Revised: 02/02/2019 Document Reviewed: 09/13/2016 Elsevier Patient Education  2020 Elsevier Inc.  

## 2020-07-30 NOTE — Assessment & Plan Note (Signed)
Recommended eating smaller high protein, low fat meals more frequently and exercising 30 mins a day 5 times a week with a goal of 10-15lb weight loss in the next 3 months. Patient voiced their understanding and motivation to adhere to these recommendations.  

## 2020-07-30 NOTE — Assessment & Plan Note (Signed)
Chronic, ongoing, would like to trial change to alternate medication now that pain improved.  Will trial switch from Duloxetine 30 MG daily to Celexa 10 MG daily, discussed with patient and educated her on this -- she will complete Duloxetine first and slowly transition.  Script sent.  Return as scheduled in November.

## 2020-07-30 NOTE — Assessment & Plan Note (Signed)
Ongoing, refuses statin.  ASCVD 7.8%.  Continue red yeast rice and diet focus.  Return as scheduled in November.

## 2020-07-30 NOTE — Assessment & Plan Note (Signed)
Noted on lumbar imaging in October 2019.  Continue focus on healthy diet and exercise for prevention.  Does not wish to start statin or ASA. 

## 2020-08-13 ENCOUNTER — Telehealth (INDEPENDENT_AMBULATORY_CARE_PROVIDER_SITE_OTHER): Payer: Self-pay | Admitting: Gastroenterology

## 2020-08-13 DIAGNOSIS — Z1211 Encounter for screening for malignant neoplasm of colon: Secondary | ICD-10-CM

## 2020-08-13 MED ORDER — NA SULFATE-K SULFATE-MG SULF 17.5-3.13-1.6 GM/177ML PO SOLN: 1.0000 | Freq: Once | ORAL | 0 refills | Status: AC

## 2020-08-13 NOTE — Progress Notes (Signed)
Gastroenterology Pre-Procedure Review  Request Date: Tuesday 08/26/20 Requesting Physician: Dr. Bonna Gains  PATIENT REVIEW QUESTIONS: The patient responded to the following health history questions as indicated:    1. Are you having any GI issues? no 2. Do you have a personal history of Polyps? no 3. Do you have a family history of Colon Cancer or Polyps? Patient states cancer runs in her family. 4. Diabetes Mellitus? no 5. Joint replacements in the past 12 months?Back surgery 12/12/19 6. Major health problems in the past 3 months?no 7. Any artificial heart valves, MVP, or defibrillator?no    MEDICATIONS & ALLERGIES:    Patient reports the following regarding taking any anticoagulation/antiplatelet therapy:   Plavix, Coumadin, Eliquis, Xarelto, Lovenox, Pradaxa, Brilinta, or Effient? no Aspirin? no  Patient confirms/reports the following medications:  Current Outpatient Medications  Medication Sig Dispense Refill  . CALCIUM-MAGNESIUM PO Take 1 tablet by mouth daily.    . Cholecalciferol (VITAMIN D) 50 MCG (2000 UT) CAPS Take 2,000 Units by mouth daily.     . citalopram (CELEXA) 10 MG tablet Take 1 tablet (10 mg total) by mouth daily. 90 tablet 4  . Coenzyme Q10-Red Yeast Rice (CO Q-10 PLUS RED YEAST RICE PO) Take 1 capsule by mouth daily.    . Dorzolamide HCl-Timolol Mal PF 2-0.5 % SOLN Apply 1 drop to eye daily.    . Omega-3 Fatty Acids (FISH OIL) 1000 MG CAPS Take 1,000 mg by mouth daily.     No current facility-administered medications for this visit.    Patient confirms/reports the following allergies:  Allergies  Allergen Reactions  . Combigan [Brimonidine Tartrate-Timolol] Other (See Comments)    Irritated eyes, redness and itching  . Prednisone Other (See Comments)    Altered Mental Status    No orders of the defined types were placed in this encounter.   AUTHORIZATION INFORMATION Primary Insurance: 1D#: Group #:  Secondary Insurance: 1D#: Group  #:  SCHEDULE INFORMATION: Date: 08/26/20 Time: Location:[facility]

## 2020-08-14 ENCOUNTER — Other Ambulatory Visit: Payer: Self-pay

## 2020-08-14 DIAGNOSIS — Z1211 Encounter for screening for malignant neoplasm of colon: Secondary | ICD-10-CM

## 2020-08-22 ENCOUNTER — Other Ambulatory Visit
Admission: RE | Admit: 2020-08-22 | Discharge: 2020-08-22 | Disposition: A | Discharge status: Home / Self Care | Payer: Medicare Other | Source: Ambulatory Visit | Attending: Gastroenterology | Admitting: Gastroenterology

## 2020-08-22 ENCOUNTER — Other Ambulatory Visit: Payer: Self-pay

## 2020-08-22 DIAGNOSIS — Z01812 Encounter for preprocedural laboratory examination: Secondary | ICD-10-CM | POA: Diagnosis not present

## 2020-08-22 DIAGNOSIS — Z20822 Contact with and (suspected) exposure to covid-19: Secondary | ICD-10-CM | POA: Insufficient documentation

## 2020-08-22 LAB — SARS CORONAVIRUS 2 (TAT 6-24 HRS): SARS Coronavirus 2: NEGATIVE

## 2020-08-26 ENCOUNTER — Ambulatory Visit: Payer: Medicare Other | Admitting: Certified Registered Nurse Anesthetist

## 2020-08-26 ENCOUNTER — Other Ambulatory Visit: Payer: Self-pay

## 2020-08-26 ENCOUNTER — Encounter: Payer: Self-pay | Admitting: Gastroenterology

## 2020-08-26 ENCOUNTER — Encounter
Admission: RE | Disposition: A | Discharge status: Home / Self Care | Payer: Self-pay | Source: Home / Self Care | Attending: Gastroenterology

## 2020-08-26 ENCOUNTER — Ambulatory Visit
Admission: RE | Admit: 2020-08-26 | Discharge: 2020-08-26 | Disposition: A | Discharge status: Home / Self Care | Payer: Medicare Other | Attending: Gastroenterology | Admitting: Gastroenterology

## 2020-08-26 DIAGNOSIS — K6289 Other specified diseases of anus and rectum: Secondary | ICD-10-CM | POA: Diagnosis not present

## 2020-08-26 DIAGNOSIS — Z1211 Encounter for screening for malignant neoplasm of colon: Secondary | ICD-10-CM | POA: Diagnosis not present

## 2020-08-26 DIAGNOSIS — K648 Other hemorrhoids: Secondary | ICD-10-CM | POA: Insufficient documentation

## 2020-08-26 DIAGNOSIS — D125 Benign neoplasm of sigmoid colon: Secondary | ICD-10-CM | POA: Diagnosis not present

## 2020-08-26 DIAGNOSIS — D124 Benign neoplasm of descending colon: Secondary | ICD-10-CM | POA: Diagnosis not present

## 2020-08-26 DIAGNOSIS — K635 Polyp of colon: Secondary | ICD-10-CM

## 2020-08-26 DIAGNOSIS — D1779 Benign lipomatous neoplasm of other sites: Secondary | ICD-10-CM | POA: Diagnosis not present

## 2020-08-26 DIAGNOSIS — D175 Benign lipomatous neoplasm of intra-abdominal organs: Secondary | ICD-10-CM

## 2020-08-26 HISTORY — PX: COLONOSCOPY WITH PROPOFOL: SHX5780

## 2020-08-26 SURGERY — COLONOSCOPY WITH PROPOFOL
Anesthesia: General

## 2020-08-26 MED ORDER — PROPOFOL 500 MG/50ML IV EMUL
INTRAVENOUS | Status: AC
  Filled 2020-08-26: qty 250

## 2020-08-26 MED ORDER — PROPOFOL 10 MG/ML IV BOLUS
INTRAVENOUS | Status: DC | PRN
  Administered 2020-08-26: 60 mg via INTRAVENOUS

## 2020-08-26 MED ORDER — PROPOFOL 500 MG/50ML IV EMUL
INTRAVENOUS | Status: DC | PRN
  Administered 2020-08-26: 160 ug/kg/min via INTRAVENOUS

## 2020-08-26 MED ORDER — GLYCOPYRROLATE 0.2 MG/ML IJ SOLN
INTRAMUSCULAR | Status: AC
  Filled 2020-08-26: qty 1

## 2020-08-26 MED ORDER — SODIUM CHLORIDE 0.9 % IV SOLN: INTRAVENOUS | Status: DC

## 2020-08-26 MED ORDER — PHENYLEPHRINE HCL (PRESSORS) 10 MG/ML IV SOLN
INTRAVENOUS | Status: AC
  Filled 2020-08-26: qty 1

## 2020-08-26 NOTE — Anesthesia Postprocedure Evaluation (Signed)
Anesthesia Post Note  Patient: Breanna Davidson  Procedure(s) Performed: COLONOSCOPY WITH PROPOFOL (N/A )  Patient location during evaluation: Endoscopy Anesthesia Type: General Level of consciousness: awake and alert Pain management: pain level controlled Vital Signs Assessment: post-procedure vital signs reviewed and stable Respiratory status: spontaneous breathing, nonlabored ventilation, respiratory function stable and patient connected to nasal cannula oxygen Cardiovascular status: blood pressure returned to baseline and stable Postop Assessment: no apparent nausea or vomiting Anesthetic complications: no   No complications documented.   Last Vitals:  Vitals:   08/26/20 0932 08/26/20 0942  BP: 135/71 134/65  Pulse: 60 (!) 51  Resp: 13 16  Temp:    SpO2: 100% 100%    Last Pain:  Vitals:   08/26/20 0942  TempSrc:   PainSc: 0-No pain                 Arita Miss

## 2020-08-26 NOTE — Op Note (Signed)
[facility] Gastroenterology Patient Name: Breanna Davidson Procedure Date: 08/26/2020 8:44 AM MRN: [id number] Account #: [id number] Date of Birth: Mar 15, 1951 Admit Type: Outpatient Age: 69 Room: [facility] ENDO ROOM 3 Gender: Female Note Status: Finalized Procedure:             Colonoscopy Indications:           Screening for colorectal malignant neoplasm Providers:             Daizy Outen B. Bonna Gains MD, MD Referring MD:          Forest Gleason Md, MD (Referring MD) Medicines:             Monitored Anesthesia Care Complications:         No immediate complications. Procedure:             Pre-Anesthesia Assessment:                        - ASA Grade Assessment: II - A patient with mild                         systemic disease.                        - Prior to the procedure, a History and Physical was                         performed, and patient medications, allergies and                         sensitivities were reviewed. The patient's tolerance                         of previous anesthesia was reviewed.                        - The risks and benefits of the procedure and the                         sedation options and risks were discussed with the                         patient. All questions were answered and informed                         consent was obtained.                        - Patient identification and proposed procedure were                         verified prior to the procedure by the physician, the                         nurse, the anesthesiologist, the anesthetist and the                         technician. The procedure was verified in the                         procedure  room.                        After obtaining informed consent, the colonoscope was                         passed under direct vision. Throughout the procedure,                         the patient's blood pressure, pulse, and oxygen                         saturations were monitored  continuously. The                         Colonoscope was introduced through the anus and                         advanced to the the cecum, identified by appendiceal                         orifice and ileocecal valve. The colonoscopy was                         performed with ease. The patient tolerated the                         procedure well. The quality of the bowel preparation                         was good. Findings:      The perianal and digital rectal examinations were normal.      There was a medium-sized lipoma, in the cecum. Biopsies were taken with       a cold forceps for histology.      Two sessile polyps were found in the sigmoid colon and descending colon.       The polyps were 5 to 10 mm in size. These polyps were removed with a       cold snare. Resection and retrieval were complete.      The exam was otherwise without abnormality.      The rectum, sigmoid colon, descending colon, transverse colon, ascending       colon and cecum appeared normal.      Non-bleeding internal hemorrhoids were found during retroflexion.      Anal papilla(e) were hypertrophied.      No additional abnormalities were found on retroflexion. 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 Biopsied.                        - Two 5 to 10 mm polyps in the sigmoid colon and in                         the descending colon, removed with a cold snare.                         Resected and retrieved.                        -  The examination was otherwise normal.                        - The rectum, sigmoid colon, descending colon,                         transverse colon, ascending colon and cecum are normal.                        - Non-bleeding internal hemorrhoids.                        - Anal papilla(e) were hypertrophied. Recommendation:        - Discharge patient to home (with escort).                        - Advance diet as tolerated.                        - Continue present  medications.                        - Await pathology results.                        - Repeat colonoscopy in 3 years.                        - The findings and recommendations were discussed with                         the patient.                        - The findings and recommendations were discussed with                         the patient's family.                        - Return to primary care physician as previously                         scheduled. Procedure Code(s):     --- Professional ---                        [phone], Colonoscopy, flexible; with removal of                         tumor(s), polyp(s), or other lesion(s) by snare                         technique                        33007, 48, Colonoscopy, flexible; with biopsy, single                         or multiple Diagnosis Code(s):     --- Professional ---  Z12.11, Encounter for screening for malignant neoplasm                         of colon                        D17.5, Benign lipomatous neoplasm of intra-abdominal                         organs                        K63.5, Polyp of colon CPT copyright 2019 American Medical Association. All rights reserved. The codes documented in this report are preliminary and upon coder review may  be revised to meet current compliance requirements.  Vonda Antigua, MD Margretta Sidle B. Bonna Gains MD, MD 08/26/2020 9:20:52 AM This report has been signed electronically. Number of Addenda: 0 Note Initiated On: 08/26/2020 8:44 AM Scope Withdrawal Time: 0 hours 14 minutes 49 seconds  Total Procedure Duration: 0 hours 22 minutes 26 seconds  Estimated Blood Loss:  Estimated blood loss: none.      [facility]

## 2020-08-26 NOTE — H&P (Signed)
Breanna Antigua, MD [address], [place], [place], [place], [address] [address], [place], [place], [place], [address] Phone: [phone]  Fax: [phone]  Primary Care Physician:  Venita Lick, NP   Pre-Procedure History & Physical: HPI:  Breanna Davidson is a 69 y.o. female is here for a colonoscopy.   Past Medical History:  Diagnosis Date  . Allergy   . Chest pain    2017 ED VISIT FOR CHEST PAIN , R/O AS GASTRIC CAUSE   . Depression    per NP notes, on cymbalta  . Headache    per patient very rare sometimes when not well rested  . Hyperlipidemia   . Increased pressure in the eye    "THEY DID SURGERY ON MY EYES TO REDUCE THE PRESSURE , THEY MADE TWO HOLES IN MY EYES "  . Osteopenia   . Spinal stenosis    LUMBAR   . Vertigo    IMPROVED BUT HAS OCCASIONAL SX    Past Surgical History:  Procedure Laterality Date  . APPENDECTOMY    . BACK SURGERY  11/2019  . CESAREAN SECTION    . DILATION AND CURETTAGE OF UTERUS    . EYE SURGERY     "THEY DID SURGERY ON MY EYES TO REDUCE THE PRESSURE , THEY MADE TWO HOLES IN MY EYES "  . REDUCTION MAMMAPLASTY  1988  . 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 Authorizing Provider  CALCIUM-MAGNESIUM PO Take 1 tablet by mouth daily.   Yes [provider]  Cholecalciferol (VITAMIN D) 50 MCG (2000 UT) CAPS Take 2,000 Units by mouth daily.    Yes [provider]  citalopram (CELEXA) 10 MG tablet Take 1 tablet (10 mg total) by mouth daily. 07/30/20  Yes Cannady, Jolene T, NP  Coenzyme Q10-Red Yeast Rice (CO Q-10 PLUS RED YEAST RICE PO) Take 1 capsule by mouth daily.   Yes [provider]  Dorzolamide HCl-Timolol Mal PF 2-0.5 % SOLN Apply 1 drop to eye daily. 07/23/20  Yes  [provider]  Omega-3 Fatty Acids (FISH OIL) 1000 MG CAPS Take 1,000 mg by mouth daily.   Yes [provider]    Allergies as of 08/13/2020 - Review Complete 08/13/2020  Allergen Reaction Noted  . Combigan [brimonidine tartrate-timolol] Other (See Comments) 07/30/2020  . Prednisone Other (See Comments) 05/13/2015    Family History  Problem Relation Age of Onset  . Breast cancer Mother 69       and again at 27  . Lung disease Mother   . Cancer Mother   . Emphysema Mother   . Breast cancer Maternal Grandmother   . CAD Father   . Hyperlipidemia Sister   . Mental illness Daughter        bipolar  . Cystic fibrosis Daughter   . Breast cancer Maternal Aunt     Social History   Socioeconomic History  . Marital status: Married    Spouse name: Not on file  . Number of children: Not on file  . Years of education: college   . Highest education level: Not on file  Occupational History  . Occupation: retired   Tobacco Use  . Smoking status: Never Smoker  . Smokeless tobacco: Never Used  Vaping Use  .  Vaping Use: Never used  Substance and Sexual Activity  . Alcohol use: Not Currently    Alcohol/week: 1.0 standard drink    Types: 1 Shots of liquor per week  . Drug use: No  . Sexual activity: Yes  Other Topics Concern  . Not on file  Social History Narrative  . Not on file   Social Determinants of Health   Financial Resource Strain:   . Difficulty of Paying Living Expenses: Not on file  Food Insecurity:   . Worried About Charity fundraiser in the Last Year: Not on file  . Ran Out of Food in the Last Year: Not on file  Transportation Needs:   . Lack of Transportation (Medical): Not on file  . Lack of Transportation (Non-Medical): Not on file  Physical Activity:   . Days of Exercise per Week: Not on file  . Minutes of Exercise per Session: Not on file  Stress:   . Feeling of Stress : Not on file  Social Connections:   . Frequency of Communication  with Friends and Family: Not on file  . Frequency of Social Gatherings with Friends and Family: Not on file  . Attends Religious Services: Not on file  . Active Member of Clubs or Organizations: Not on file  . Attends Archivist Meetings: Not on file  . Marital Status: Not on file  Intimate Partner Violence:   . Fear of Current or Ex-Partner: Not on file  . Emotionally Abused: Not on file  . Physically Abused: Not on file  . Sexually Abused: Not on file    Review of Systems: See HPI, otherwise negative ROS  Physical Exam: BP 128/80   Pulse 64   Temp (!) 96.4 F (35.8 C) (Temporal)   Resp 18   Ht 5\' 2"  (1.575 m)   Wt 73 kg   LMP  (LMP Unknown)   SpO2 100%   BMI 29.45 kg/m  General:   Alert,  pleasant and cooperative in NAD Head:  Normocephalic and atraumatic. Neck:  Supple; no masses or thyromegaly. Lungs:  Clear throughout to auscultation, normal respiratory effort.    Heart:  +S1, +S2, Regular rate and rhythm, No edema. Abdomen:  Soft, nontender and nondistended. Normal bowel sounds, without guarding, and without rebound.   Neurologic:  Alert and  oriented x4;  grossly normal neurologically.  Impression/Plan: Breanna Davidson is here for a colonoscopy to be performed for average risk screening.  Risks, benefits, limitations, and alternatives regarding  colonoscopy have been reviewed with the patient.  Questions have been answered.  All parties agreeable.   Virgel Manifold, MD  08/26/2020, 8:42 AM

## 2020-08-26 NOTE — Transfer of Care (Signed)
Immediate Anesthesia Transfer of Care Note  Patient: Breanna Davidson  Procedure(s) Performed: COLONOSCOPY WITH PROPOFOL (N/A )  Patient Location: PACU  Anesthesia Type:General  Level of Consciousness: sedated  Airway & Oxygen Therapy: Patient Spontanous Breathing and Patient connected to nasal cannula oxygen  Post-op Assessment: Report given to RN and Post -op Vital signs reviewed and stable  Post vital signs: Reviewed and stable  Last Vitals:  Vitals Value Taken Time  BP    Temp    Pulse 78 08/26/20 0922  Resp 19 08/26/20 0922  SpO2 99 % 08/26/20 0922  Vitals shown include unvalidated device data.  Last Pain:  Vitals:   08/26/20 0820  TempSrc: Temporal  PainSc: 0-No pain         Complications: No complications documented.

## 2020-08-26 NOTE — Anesthesia Procedure Notes (Signed)
Performed by: Cailin Gebel, CRNA Pre-anesthesia Checklist: Patient identified, Emergency Drugs available, Suction available, Patient being monitored and Timeout performed Patient Re-evaluated:Patient Re-evaluated prior to induction Oxygen Delivery Method: Nasal cannula Induction Type: IV induction Placement Confirmation: CO2 detector       

## 2020-08-26 NOTE — Anesthesia Preprocedure Evaluation (Signed)
Anesthesia Evaluation  Patient identified by MRN, date of birth, ID band Patient awake    Reviewed: Allergy & Precautions, NPO status , Patient's Chart, lab work & pertinent test results  History of Anesthesia Complications Negative for: history of anesthetic complications  Airway Mallampati: II  TM Distance: >3 FB Neck ROM: Full    Dental no notable dental hx. (+) Teeth Intact   Pulmonary neg pulmonary ROS, neg sleep apnea, neg COPD, Patient abstained from smoking.Not current smoker,    Pulmonary exam normal breath sounds clear to auscultation       Cardiovascular Exercise Tolerance: Good METS(-) hypertension(-) CAD and (-) Past MI negative cardio ROS  (-) dysrhythmias  Rhythm:Regular Rate:Normal - Systolic murmurs    Neuro/Psych  Headaches, PSYCHIATRIC DISORDERS Depression    GI/Hepatic neg GERD  ,(+)     (-) substance abuse  ,   Endo/Other  neg diabetes  Renal/GU negative Renal ROS     Musculoskeletal  (+) Arthritis ,   Abdominal   Peds  Hematology   Anesthesia Other Findings Past Medical History: No date: Allergy No date: Chest pain     Comment:  2017 ED VISIT FOR CHEST PAIN , R/O AS GASTRIC CAUSE  No date: Depression     Comment:  per NP notes, on cymbalta No date: Headache     Comment:  per patient very rare sometimes when not well rested No date: Hyperlipidemia No date: Increased pressure in the eye     Comment:  "THEY DID SURGERY ON MY EYES TO REDUCE THE PRESSURE ,               THEY MADE TWO HOLES IN MY EYES " No date: Osteopenia No date: Spinal stenosis     Comment:  LUMBAR  No date: Vertigo     Comment:  IMPROVED BUT HAS OCCASIONAL SX  Reproductive/Obstetrics                             Anesthesia Physical Anesthesia Plan  ASA: II  Anesthesia Plan: General   Post-op Pain Management:    Induction: Intravenous  PONV Risk Score and Plan: 3 and Ondansetron,  Propofol infusion and TIVA  Airway Management Planned: Nasal Cannula  Additional Equipment: None  Intra-op Plan:   Post-operative Plan:   Informed Consent: I have reviewed the patients History and Physical, chart, labs and discussed the procedure including the risks, benefits and alternatives for the proposed anesthesia with the patient or authorized representative who has indicated his/her understanding and acceptance.     Dental advisory given  Plan Discussed with: CRNA and Surgeon  Anesthesia Plan Comments: (Discussed risks of anesthesia with patient, including possibility of difficulty with spontaneous ventilation under anesthesia necessitating airway intervention, PONV, and rare risks such as cardiac or respiratory or neurological events. Patient understands.)        Anesthesia Quick Evaluation

## 2020-08-27 ENCOUNTER — Encounter: Payer: Self-pay | Admitting: Gastroenterology

## 2020-08-27 ENCOUNTER — Telehealth: Payer: Self-pay | Admitting: Nurse Practitioner

## 2020-08-27 LAB — SURGICAL PATHOLOGY

## 2020-08-27 NOTE — Telephone Encounter (Signed)
Pt has been sch for 09-08-2020

## 2020-08-27 NOTE — Telephone Encounter (Signed)
Error

## 2020-08-27 NOTE — Telephone Encounter (Signed)
Copied from [place] [phone]. Topic: Medicare AWV >> Aug 27, 2020 10:58 AM Cher Nakai R wrote: Reason for CRM: Left message for patient to call back and schedule the Medicare Annual Wellness Visit (AWV) virtually.  Last AWV 08/20/2019  Please schedule at anytime with [facility]-Nurse Health Advisor.  45 minute appointment  Any questions, please call me at [phone]

## 2020-09-08 ENCOUNTER — Ambulatory Visit (INDEPENDENT_AMBULATORY_CARE_PROVIDER_SITE_OTHER): Payer: Medicare Other

## 2020-09-08 VITALS — Ht 62.0 in | Wt 166.0 lb

## 2020-09-08 DIAGNOSIS — Z0000 Encounter for general adult medical examination without abnormal findings: Secondary | ICD-10-CM | POA: Diagnosis not present

## 2020-09-08 NOTE — Patient Instructions (Signed)
Breanna Davidson , Thank you for taking time to come for your Medicare Wellness Visit. I appreciate your ongoing commitment to your health goals. Please review the following plan we discussed and let me know if I can assist you in the future.   Screening recommendations/referrals: Colonoscopy: completed 08/26/2020, due 08/26/2022 Mammogram: patient to schedule Bone Density: completed 07/06/2016 Recommended yearly ophthalmology/optometry visit for glaucoma screening and checkup Recommended yearly dental visit for hygiene and checkup  Vaccinations: Influenza vaccine: completed 07/13/2020 Pneumococcal vaccine: completed 07/26/2017 Tdap vaccine: due Shingles vaccine: discussed   Covid-19: 12/02/2019, 11/14/2019  Advanced directives: copy in chart  Conditions/risks identified: none  Next appointment: Follow up in one year for your annual wellness visit    Preventive Care 31 Years and Older, Female Preventive care refers to lifestyle choices and visits with your health care provider that can promote health and wellness. What does preventive care include?  A yearly physical exam. This is also called an annual well check.  Dental exams once or twice a year.  Routine eye exams. Ask your health care provider how often you should have your eyes checked.  Personal lifestyle choices, including:  Daily care of your teeth and gums.  Regular physical activity.  Eating a healthy diet.  Avoiding tobacco and drug use.  Limiting alcohol use.  Practicing safe sex.  Taking low-dose aspirin every day.  Taking vitamin and mineral supplements as recommended by your health care provider. What happens during an annual well check? The services and screenings done by your health care provider during your annual well check will depend on your age, overall health, lifestyle risk factors, and family history of disease. Counseling  Your health care provider may ask you questions about your:  Alcohol  use.  Tobacco use.  Drug use.  Emotional well-being.  Home and relationship well-being.  Sexual activity.  Eating habits.  History of falls.  Memory and ability to understand (cognition).  Work and work Statistician.  Reproductive health. Screening  You may have the following tests or measurements:  Height, weight, and BMI.  Blood pressure.  Lipid and cholesterol levels. These may be checked every 5 years, or more frequently if you are over 19 years old.  Skin check.  Lung cancer screening. You may have this screening every year starting at age 75 if you have a 30-pack-year history of smoking and currently smoke or have quit within the past 15 years.  Fecal occult blood test (FOBT) of the stool. You may have this test every year starting at age 69.  Flexible sigmoidoscopy or colonoscopy. You may have a sigmoidoscopy every 5 years or a colonoscopy every 10 years starting at age 61.  Hepatitis C blood test.  Hepatitis B blood test.  Sexually transmitted disease (STD) testing.  Diabetes screening. This is done by checking your blood sugar (glucose) after you have not eaten for a while (fasting). You may have this done every 1-3 years.  Bone density scan. This is done to screen for osteoporosis. You may have this done starting at age 60.  Mammogram. This may be done every 1-2 years. Talk to your health care provider about how often you should have regular mammograms. Talk with your health care provider about your test results, treatment options, and if necessary, the need for more tests. Vaccines  Your health care provider may recommend certain vaccines, such as:  Influenza vaccine. This is recommended every year.  Tetanus, diphtheria, and acellular pertussis (Tdap, Td) vaccine. You may need  a Td booster every 10 years.  Zoster vaccine. You may need this after age 79.  Pneumococcal 13-valent conjugate (PCV13) vaccine. One dose is recommended after age  37.  Pneumococcal polysaccharide (PPSV23) vaccine. One dose is recommended after age 77. Talk to your health care provider about which screenings and vaccines you need and how often you need them. This information is not intended to replace advice given to you by your health care provider. Make sure you discuss any questions you have with your health care provider. Document Released: 11/07/2015 Document Revised: 06/30/2016 Document Reviewed: 08/12/2015 Elsevier Interactive Patient Education  2017 [place] Prevention in the Home Falls can cause injuries. They can happen to people of all ages. There are many things you can do to make your home safe and to help prevent falls. What can I do on the outside of my home?  Regularly fix the edges of walkways and driveways and fix any cracks.  Remove anything that might make you trip as you walk through a door, such as a raised step or threshold.  Trim any bushes or trees on the path to your home.  Use bright outdoor lighting.  Clear any walking paths of anything that might make someone trip, such as rocks or tools.  Regularly check to see if handrails are loose or broken. Make sure that both sides of any steps have handrails.  Any raised decks and porches should have guardrails on the edges.  Have any leaves, snow, or ice cleared regularly.  Use sand or salt on walking paths during winter.  Clean up any spills in your garage right away. This includes oil or grease spills. What can I do in the bathroom?  Use night lights.  Install grab bars by the toilet and in the tub and shower. Do not use towel bars as grab bars.  Use non-skid mats or decals in the tub or shower.  If you need to sit down in the shower, use a plastic, non-slip stool.  Keep the floor dry. Clean up any water that spills on the floor as soon as it happens.  Remove soap buildup in the tub or shower regularly.  Attach bath mats securely with double-sided  non-slip rug tape.  Do not have throw rugs and other things on the floor that can make you trip. What can I do in the bedroom?  Use night lights.  Make sure that you have a light by your bed that is easy to reach.  Do not use any sheets or blankets that are too big for your bed. They should not hang down onto the floor.  Have a firm chair that has side arms. You can use this for support while you get dressed.  Do not have throw rugs and other things on the floor that can make you trip. What can I do in the kitchen?  Clean up any spills right away.  Avoid walking on wet floors.  Keep items that you use a lot in easy-to-reach places.  If you need to reach something above you, use a strong step stool that has a grab bar.  Keep electrical cords out of the way.  Do not use floor polish or wax that makes floors slippery. If you must use wax, use non-skid floor wax.  Do not have throw rugs and other things on the floor that can make you trip. What can I do with my stairs?  Do not leave any items on the  stairs.  Make sure that there are handrails on both sides of the stairs and use them. Fix handrails that are broken or loose. Make sure that handrails are as long as the stairways.  Check any carpeting to make sure that it is firmly attached to the stairs. Fix any carpet that is loose or worn.  Avoid having throw rugs at the top or bottom of the stairs. If you do have throw rugs, attach them to the floor with carpet tape.  Make sure that you have a light switch at the top of the stairs and the bottom of the stairs. If you do not have them, ask someone to add them for you. What else can I do to help prevent falls?  Wear shoes that:  Do not have high heels.  Have rubber bottoms.  Are comfortable and fit you well.  Are closed at the toe. Do not wear sandals.  If you use a stepladder:  Make sure that it is fully opened. Do not climb a closed stepladder.  Make sure that both  sides of the stepladder are locked into place.  Ask someone to hold it for you, if possible.  Clearly mark and make sure that you can see:  Any grab bars or handrails.  First and last steps.  Where the edge of each step is.  Use tools that help you move around (mobility aids) if they are needed. These include:  Canes.  Walkers.  Scooters.  Crutches.  Turn on the lights when you go into a dark area. Replace any light bulbs as soon as they burn out.  Set up your furniture so you have a clear path. Avoid moving your furniture around.  If any of your floors are uneven, fix them.  If there are any pets around you, be aware of where they are.  Review your medicines with your doctor. Some medicines can make you feel dizzy. This can increase your chance of falling. Ask your doctor what other things that you can do to help prevent falls. This information is not intended to replace advice given to you by your health care provider. Make sure you discuss any questions you have with your health care provider. Document Released: 08/07/2009 Document Revised: 03/18/2016 Document Reviewed: 11/15/2014 Elsevier Interactive Patient Education  2017 [employer].

## 2020-09-08 NOTE — Progress Notes (Signed)
I connected with Breanna Davidson today by telephone and verified that I am speaking with the correct person using two identifiers. Location patient: home Location provider: work Persons participating in the virtual visit: Romell, [place] LPN.   I discussed the limitations, risks, security and privacy concerns of performing an evaluation and management service by telephone and the availability of in person appointments. I also discussed with the patient that there may be a patient responsible charge related to this service. The patient expressed understanding and verbally consented to this telephonic visit.    Interactive audio and video telecommunications were attempted between this provider and patient, however failed, due to patient having technical difficulties OR patient did not have access to video capability.  We continued and completed visit with audio only.     Vital signs may be patient reported or missing.  Subjective:   Breanna Davidson is a 68 y.o. female who presents for Medicare Annual (Subsequent) preventive examination.  Review of Systems     Cardiac Risk Factors include: advanced age (>85men, >36 women);obesity (BMI >30kg/m2)     Objective:    Today's Vitals   09/08/20 1426  Weight: 166 lb (75.3 kg)  Height: 5\' 2"  (1.575 m)   Body mass index is 30.36 kg/m.  Advanced Directives 09/08/2020 08/26/2020 12/13/2019 12/12/2019 12/10/2019 08/20/2019 06/19/2019  Does Patient Have a Medical Advance Directive? Yes Yes Yes Yes Yes Yes Yes  Type of Paramedic of [place];Living will Living will;Healthcare Power of Attorney Living will - Living will Living will;Healthcare Power of Attorney Living will  Does patient want to make changes to medical advance directive? - - No - Patient declined - - - No - Patient declined  Copy of [place] in Chart? Yes - validated most recent copy scanned in chart (See row information) No - copy  requested - - - - -    Current Medications (verified) Outpatient Encounter Medications as of 09/08/2020  Medication Sig  . CALCIUM-MAGNESIUM PO Take 1 tablet by mouth daily.  . Cholecalciferol (VITAMIN D) 50 MCG (2000 UT) CAPS Take 2,000 Units by mouth daily.   . citalopram (CELEXA) 10 MG tablet Take 1 tablet (10 mg total) by mouth daily.  . Coenzyme Q10-Red Yeast Rice (CO Q-10 PLUS RED YEAST RICE PO) Take 1 capsule by mouth daily.  . Dorzolamide HCl-Timolol Mal PF 2-0.5 % SOLN Apply 1 drop to eye daily.  . Omega-3 Fatty Acids (FISH OIL) 1000 MG CAPS Take 1,000 mg by mouth daily. (Patient not taking: Reported on 09/08/2020)   No facility-administered encounter medications on file as of 09/08/2020.    Allergies (verified) Combigan [brimonidine tartrate-timolol] and Prednisone   History: Past Medical History:  Diagnosis Date  . Allergy   . Chest pain    2017 ED VISIT FOR CHEST PAIN , R/O AS GASTRIC CAUSE   . Depression    per NP notes, on cymbalta  . Headache    per patient very rare sometimes when not well rested  . Hyperlipidemia   . Increased pressure in the eye    "THEY DID SURGERY ON MY EYES TO REDUCE THE PRESSURE , THEY MADE TWO HOLES IN MY EYES "  . Osteopenia   . Spinal stenosis    LUMBAR   . Vertigo    IMPROVED BUT HAS OCCASIONAL SX   Past Surgical History:  Procedure Laterality Date  . APPENDECTOMY    . BACK SURGERY  11/2019  . CESAREAN  SECTION    . COLONOSCOPY WITH PROPOFOL N/A 08/26/2020   Procedure: COLONOSCOPY WITH PROPOFOL;  Surgeon: Virgel Manifold, MD;  Location: [facility] END[facility]COPY;  Service: Endoscopy;  Laterality: N/A;  . DILATION AND CURETTAGE OF UTERUS    . EYE SURGERY     "THEY DID SURGERY ON MY EYES TO REDUCE THE PRESSURE , THEY MADE TWO HOLES IN MY EYES "  . REDUCTION MAMMAPLASTY  1988  . 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 Breast cancer Mother 55       and again at 67  . Lung disease Mother   . Cancer Mother   . Emphysema Mother   . Breast cancer Maternal Grandmother   . CAD Father   . Hyperlipidemia Sister   . Mental illness Daughter        bipolar  . Cystic fibrosis Daughter   . Breast cancer Maternal Aunt    Social History   Socioeconomic History  . Marital status: Married    Spouse name: Not on file  . Number of children: Not on file  . Years of education: college   . Highest education level: Not on file  Occupational History  . Occupation: retired   Tobacco Use  . Smoking status: Never Smoker  . Smokeless tobacco: Never Used  Vaping Use  . Vaping Use: Never used  Substance and Sexual Activity  . Alcohol use: Not Currently    Alcohol/week: 1.0 standard drink    Types: 1 Shots of liquor per week  . Drug use: No  . Sexual activity: Yes  Other Topics Concern  . Not on file  Social History Narrative  . Not on file   Social Determinants of Health   Financial Resource Strain: Low Risk   . Difficulty of Paying Living Expenses: Not hard at all  Food Insecurity: No Food Insecurity  . Worried About Charity fundraiser in the Last Year: Never true  . Ran Out of Food in the Last Year: Never true  Transportation Needs: No Transportation Needs  . Lack of Transportation (Medical): No  . Lack of Transportation (Non-Medical): No  Physical Activity: Insufficiently Active  . Days of Exercise per Week: 3 days  . Minutes of Exercise per Session: 30 min  Stress: No Stress Concern Present  . Feeling of Stress : Not at all  Social Connections:   . Frequency of Communication with Friends and Family: Not on file  . Frequency of Social Gatherings with Friends and Family: Not on file  . Attends Religious Services: Not on file  . Active Member of Clubs or Organizations: Not on file  . Attends Archivist  Meetings: Not on file  . Marital Status: Not on file    Tobacco Counseling Counseling given: Not Answered   Clinical Intake:  Pre-visit preparation completed: Yes  Pain : No/denies pain     Nutritional Status: BMI > 30  Obese Nutritional Risks: None Diabetes: No  How often do you need to have someone help you when you read instructions, pamphlets, or other written materials from your doctor or pharmacy?: 1 - Never What is the last grade level you completed in school?: college  Diabetic? no  Interpreter Needed?: No  Information entered by :: NAllen LPN   Activities of  Daily Living In your present state of health, do you have any difficulty performing the following activities: 09/08/2020 07/30/2020  Hearing? N N  Comment if it's noisy -  Vision? N N  Difficulty concentrating or making decisions? N N  Walking or climbing stairs? N N  Dressing or bathing? N N  Doing errands, shopping? N N  Preparing Food and eating ? N -  Using the Toilet? N -  In the past six months, have you accidently leaked urine? N -  Do you have problems with loss of bowel control? N -  Managing your Medications? N -  Managing your Finances? N -  Housekeeping or managing your Housekeeping? N -  Some recent data might be hidden    Patient Care Team: Venita Lick, NP as PCP - General (Nurse Practitioner)  Indicate any recent Medical Services you may have received from other than [facility] providers in the past year (date may be approximate).     Assessment:   This is a routine wellness examination for Denay.  Hearing/Vision screen  Hearing Screening   125Hz  250Hz  500Hz  1000Hz  2000Hz  3000Hz  4000Hz  6000Hz  8000Hz   Right ear:           Left ear:           Vision Screening Comments: Regular eye exams, [place]  Dietary issues and exercise activities discussed: Current Exercise Habits: Home exercise routine, Type of exercise: walking, Time (Minutes): 30, Frequency (Times/Week):  3, Weekly Exercise (Minutes/Week): 90  Goals    . DIET - INCREASE WATER INTAKE     Recommend drinking at least 6-8 glasses of water a day     . Patient Stated     09/08/2020, wants to lose 10 pounds      Depression Screen PHQ 2/9 Scores 09/08/2020 07/30/2020 01/29/2020 08/20/2019 05/17/2019 02/13/2019 12/13/2018  PHQ - 2 Score 0 0 0 0 0 0 4  PHQ- 9 Score 0 0 1 - 0 2 5    Fall Risk Fall Risk  09/08/2020 07/30/2020 08/20/2019 02/13/2019 06/23/2018  Falls in the past year? 1 0 1 0 Yes  Comment was going too fast - - - -  Number falls in past yr: 0 0 1 0 1  Injury with Fall? 0 0 0 0 No  Risk for fall due to : History of fall(s);Medication side effect No Fall Risks - - -  Follow up Falls evaluation completed;Education provided;Falls prevention discussed Falls evaluation completed - - -    Any stairs in or around the home? Yes  If so, are there any without handrails? No  Home free of loose throw rugs in walkways, pet beds, electrical cords, etc? Yes  Adequate lighting in your home to reduce risk of falls? Yes   ASSISTIVE DEVICES UTILIZED TO PREVENT FALLS:  Life alert? No  Use of a cane, walker or w/c? No  Grab bars in the bathroom? No  Shower chair or bench in shower? No  Elevated toilet seat or a handicapped toilet? No   TIMED UP AND GO:  Was the test performed? No   Cognitive Function:     6CIT Screen 09/08/2020 08/20/2019 06/23/2018 06/23/2017  What Year? 0 points 0 points 0 points 0 points  What month? 0 points 0 points 0 points 0 points  What time? 0 points 0 points 0 points 0 points  Count back from 20 0 points 0 points 0 points 0 points  Months in reverse 0 points 0 points  0 points 0 points  Repeat phrase 2 points 0 points 0 points 0 points  Total Score 2 0 0 0    Immunizations Immunization History  Administered Date(s) Administered  . Fluad Quad(high Dose 65+) 07/31/2019  . Influenza, High Dose Seasonal PF 07/23/2016, 07/26/2017, 08/01/2018  . PFIZER SARS-COV-2  Vaccination 11/14/2019, 12/02/2019  . Pneumococcal Conjugate-13 07/23/2016  . Pneumococcal Polysaccharide-23 07/26/2017  . Td 11/13/2004  . Zoster 09/12/2015    TDAP status: Due, Education has been provided regarding the importance of this vaccine. Advised may receive this vaccine at local pharmacy or Health Dept. Aware to provide a copy of the vaccination record if obtained from local pharmacy or Health Dept. Verbalized acceptance and understanding. Flu Vaccine status: Up to date Pneumococcal vaccine status: Up to date Covid-19 vaccine status: Completed vaccines  Qualifies for Shingles Vaccine? Yes   Zostavax completed Yes   Shingrix Completed?: No.    Education has been provided regarding the importance of this vaccine. Patient has been advised to call insurance company to determine out of pocket expense if they have not yet received this vaccine. Advised may also receive vaccine at local pharmacy or Health Dept. Verbalized acceptance and understanding.  Screening Tests Health Maintenance  Topic Date Due  . TETANUS/TDAP  11/13/2014  . MAMMOGRAM  08/29/2021  . COLONOSCOPY  08/27/2023  . INFLUENZA VACCINE  Completed  . DEXA SCAN  Completed  . COVID-19 Vaccine  Completed  . Hepatitis C Screening  Completed  . PNA vac Low Risk Adult  Completed    Health Maintenance  Health Maintenance Due  Topic Date Due  . TETANUS/TDAP  11/13/2014    Colorectal cancer screening: Completed 08/26/2020. Repeat every 3 years Mammogram status: due Bone Density status: Completed 07/06/2016. Results reflect: Bone density results: OSTEOPENIA. Repeat every 5 years.  Lung Cancer Screening: (Low Dose CT Chest recommended if Age 34-80 years, 30 pack-year currently smoking OR have quit w/in 15years.) does not qualify.   Lung Cancer Screening Referral: no  Additional Screening:  Hepatitis C Screening: does qualify; Completed 03/19/2016  Vision Screening: Recommended annual ophthalmology exams for early  detection of glaucoma and other disorders of the eye. Is the patient up to date with their annual eye exam?  Yes  Who is the provider or what is the name of the office in which the patient attends annual eye exams? [facility] If pt is not established with a provider, would they like to be referred to a provider to establish care? No .   Dental Screening: Recommended annual dental exams for proper oral hygiene  Community Resource Referral / Chronic Care Management: CRR required this visit?  No   CCM required this visit?  No      Plan:     I have personally reviewed and noted the following in the patient's chart:   . Medical and social history . Use of alcohol, tobacco or illicit drugs  . Current medications and supplements . Functional ability and status . Nutritional status . Physical activity . Advanced directives . List of other physicians . Hospitalizations, surgeries, and ER visits in previous 12 months . Vitals . Screenings to include cognitive, depression, and falls . Referrals and appointments  In addition, I have reviewed and discussed with patient certain preventive protocols, quality metrics, and best practice recommendations. A written personalized care plan for preventive services as well as general preventive health recommendations were provided to patient.     Kellie Simmering, LPN  09/08/2020   Nurse Notes:

## 2020-09-22 ENCOUNTER — Encounter: Payer: Self-pay | Admitting: Nurse Practitioner

## 2020-09-22 ENCOUNTER — Ambulatory Visit (INDEPENDENT_AMBULATORY_CARE_PROVIDER_SITE_OTHER): Payer: Medicare Other | Admitting: Nurse Practitioner

## 2020-09-22 ENCOUNTER — Other Ambulatory Visit: Payer: Self-pay

## 2020-09-22 VITALS — BP 121/74 | HR 58 | Temp 98.4°F | Ht 62.0 in | Wt 172.0 lb

## 2020-09-22 DIAGNOSIS — E6609 Other obesity due to excess calories: Secondary | ICD-10-CM

## 2020-09-22 DIAGNOSIS — B354 Tinea corporis: Secondary | ICD-10-CM

## 2020-09-22 DIAGNOSIS — Z1231 Encounter for screening mammogram for malignant neoplasm of breast: Secondary | ICD-10-CM | POA: Diagnosis not present

## 2020-09-22 DIAGNOSIS — I700 Atherosclerosis of aorta: Secondary | ICD-10-CM | POA: Diagnosis not present

## 2020-09-22 DIAGNOSIS — F330 Major depressive disorder, recurrent, mild: Secondary | ICD-10-CM

## 2020-09-22 DIAGNOSIS — Z6831 Body mass index (BMI) 31.0-31.9, adult: Secondary | ICD-10-CM

## 2020-09-22 DIAGNOSIS — J301 Allergic rhinitis due to pollen: Secondary | ICD-10-CM

## 2020-09-22 DIAGNOSIS — E7800 Pure hypercholesterolemia, unspecified: Secondary | ICD-10-CM

## 2020-09-22 DIAGNOSIS — M8588 Other specified disorders of bone density and structure, other site: Secondary | ICD-10-CM

## 2020-09-22 DIAGNOSIS — Z0000 Encounter for general adult medical examination without abnormal findings: Secondary | ICD-10-CM | POA: Diagnosis not present

## 2020-09-22 MED ORDER — FLUTICASONE PROPIONATE 50 MCG/ACT NA SUSP: 2.0000 | Freq: Every day | NASAL | 6 refills | Status: DC

## 2020-09-22 MED ORDER — NYSTATIN 100000 UNIT/GM EX CREA: 1.0000 "application " | TOPICAL_CREAM | Freq: Two times a day (BID) | CUTANEOUS | 1 refills | Status: DC

## 2020-09-22 NOTE — Assessment & Plan Note (Signed)
Chronic, stable.  Will continue Celexa 10 MG daily, discussed with patient and educated her on this.  Adjust regimen as needed.  Denies SI/HI.  Return to office in 6 months.

## 2020-09-22 NOTE — Assessment & Plan Note (Signed)
Ongoing, refuses statin.  ASCVD 7.9%.  Continue red yeast rice and diet focus.  Return in 6 months.  Lipid panel today.

## 2020-09-22 NOTE — Assessment & Plan Note (Signed)
Noted on lumbar imaging in October 2019.  Continue focus on healthy diet and exercise for prevention.  Does not wish to start statin or ASA.

## 2020-09-22 NOTE — Assessment & Plan Note (Signed)
Noted on imaging in 2017.  Recommend she continue daily Vitamin D supplement and calcium intake.  Will repeat DEXA September 2022.

## 2020-09-22 NOTE — Assessment & Plan Note (Signed)
Chronic, ongoing.  Will send in refills on Flonase and recommend she start Claritin daily to help with postnasal drainage.

## 2020-09-22 NOTE — Progress Notes (Signed)
BP 121/74   Pulse (!) 58   Temp 98.4 F (36.9 C)   Ht 5\' 2"  (1.575 m)   Wt 172 lb (78 kg)   LMP  (LMP Unknown)   SpO2 98%   BMI 31.46 kg/m    Subjective:    Patient ID: Breanna Davidson, female    DOB: 1951-04-10, 69 y.o.   MRN: [id number]  HPI: Breanna Davidson is a 69 y.o. female presenting on 09/22/2020 for comprehensive medical examination. Current medical complaints include:none -- requests refills on Flonase for allergic rhinitis  She currently lives with: husband Menopausal Symptoms: no   The 10-year ASCVD risk score [name] DC Brooke Bonito., et al., 2013) is: 7.9%   Values used to calculate the score:     Age: 41 years     Sex: Female     Is Non-Hispanic African American: No     Diabetic: No     Tobacco smoker: No     Systolic Blood Pressure: 973 mmHg     Is BP treated: No     HDL Cholesterol: 77 mg/dL     Total Cholesterol: 276 mg/dL Aortic atherosclerosis noted on lumbar imaging 08/02/2018.  OSTEOPENIA Noted on September 2017 scan. Satisfied with current treatment?: yes Adequate calcium & vitamin D: yes Intolerance to bisphosphonates:unknown Weight bearing exercises: yes  DEPRESSION Continues on Celexa 10 MG daily. Mood status: stable Satisfied with current treatment?: yes Symptom severity: mild  Duration of current treatment : chronic Side effects: no Medication compliance: good compliance Psychotherapy/counseling: none Depressed mood: no Anxious mood: no Anhedonia: no Significant weight loss or gain: no Insomnia: none Fatigue: no Feelings of worthlessness or guilt: no Impaired concentration/indecisiveness: no Suicidal ideations: no Hopelessness: no Crying spells: no Depression Screen done today and results listed below:  Depression screen [facility] 2/9 09/08/2020 07/30/2020 01/29/2020 08/20/2019 05/17/2019  Decreased Interest 0 0 0 0 0  Down, Depressed, Hopeless 0 0 0 0 0  PHQ - 2 Score 0 0 0 0 0  Altered sleeping 0 0 1 - 0  Tired, decreased energy - - 0 - 0   Change in appetite 0 0 0 - 0  Feeling bad or failure about yourself  0 0 0 - 0  Trouble concentrating 0 0 0 - 0  Moving slowly or fidgety/restless 0 0 0 - -  Suicidal thoughts 0 0 0 - 0  PHQ-9 Score 0 0 1 - 0  Difficult doing work/chores Not difficult at all Not difficult at all Not difficult at all - -    The patient does not have a history of falls. I did not complete a risk assessment for falls. A plan of care for falls was not documented.   Past Medical History:  Past Medical History:  Diagnosis Date  . Allergy   . Chest pain    2017 ED VISIT FOR CHEST PAIN , R/O AS GASTRIC CAUSE   . Depression    per NP notes, on cymbalta  . Headache    per patient very rare sometimes when not well rested  . Hyperlipidemia   . Increased pressure in the eye    "THEY DID SURGERY ON MY EYES TO REDUCE THE PRESSURE , THEY MADE TWO HOLES IN MY EYES "  . Osteopenia   . Spinal stenosis    LUMBAR   . Vertigo    IMPROVED BUT HAS OCCASIONAL SX    Surgical History:  Past Surgical History:  Procedure Laterality Date  . APPENDECTOMY    .  BACK SURGERY  11/2019  . CESAREAN SECTION    . COLONOSCOPY WITH PROPOFOL N/A 08/26/2020   Procedure: COLONOSCOPY WITH PROPOFOL;  Surgeon: Virgel Manifold, MD;  Location: [facility] ENDOSCOPY;  Service: Endoscopy;  Laterality: N/A;  . DILATION AND CURETTAGE OF UTERUS    . EYE SURGERY     "THEY DID SURGERY ON MY EYES TO REDUCE THE PRESSURE , THEY MADE TWO HOLES IN MY EYES "  . REDUCTION MAMMAPLASTY  1988  . 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 CALCIUM-MAGNESIUM PO Take 1 tablet by mouth daily.  . Cholecalciferol (VITAMIN D) 50 MCG (2000 UT) CAPS Take 2,000 Units by mouth daily.   . citalopram (CELEXA) 10 MG tablet Take 1  tablet (10 mg total) by mouth daily.  . Coenzyme Q10-Red Yeast Rice (CO Q-10 PLUS RED YEAST RICE PO) Take 1 capsule by mouth daily.  . Dorzolamide HCl-Timolol Mal PF 2-0.5 % SOLN Apply 1 drop to eye daily.  . Omega-3 Fatty Acids (FISH OIL) 1000 MG CAPS Take 1,000 mg by mouth daily.    No current facility-administered medications on file prior to visit.    Allergies:  Allergies  Allergen Reactions  . Combigan [Brimonidine Tartrate-Timolol] Other (See Comments)    Irritated eyes, redness and itching  . Prednisone Other (See Comments)    Altered Mental Status    Social History:  Social History   Socioeconomic History  . Marital status: Married    Spouse name: Not on file  . Number of children: Not on file  . Years of education: college   . Highest education level: Not on file  Occupational History  . Occupation: retired   Tobacco Use  . Smoking status: Never Smoker  . Smokeless tobacco: Never Used  Vaping Use  . Vaping Use: Never used  Substance and Sexual Activity  . Alcohol use: Not Currently    Alcohol/week: 1.0 standard drink    Types: 1 Shots of liquor per week  . Drug use: No  . Sexual activity: Yes  Other Topics Concern  . Not on file  Social History Narrative  . Not on file   Social Determinants of Health   Financial Resource Strain: Low Risk   . Difficulty of Paying Living Expenses: Not hard at all  Food Insecurity: No Food Insecurity  . Worried About Charity fundraiser in the Last Year: Never true  . Ran Out of Food in the Last Year: Never true  Transportation Needs: No Transportation Needs  . Lack of Transportation (Medical): No  . Lack of Transportation (Non-Medical): No  Physical Activity: Insufficiently Active  . Days of Exercise per Week: 3 days  . Minutes of Exercise per Session: 30 min  Stress: No Stress Concern Present  . Feeling of Stress : Not at all  Social Connections:   . Frequency of Communication with Friends and Family: Not on file   . Frequency of Social Gatherings with Friends and Family: Not on file  . Attends Religious Services: Not on file  . Active Member of Clubs or Organizations: Not on file  . Attends Archivist Meetings: Not on file  . Marital Status: Not on file  Intimate Partner Violence:   . Fear of Current or Ex-Partner:  Not on file  . Emotionally Abused: Not on file  . Physically Abused: Not on file  . Sexually Abused: Not on file   Social History   Tobacco Use  Smoking Status Never Smoker  Smokeless Tobacco Never Used   Social History   Substance and Sexual Activity  Alcohol Use Not Currently  . Alcohol/week: 1.0 standard drink  . Types: 1 Shots of liquor per week    Family History:  Family History  Problem Relation Age of Onset  . Breast cancer Mother 77       and again at 84  . Lung disease Mother   . Cancer Mother   . Emphysema Mother   . Breast cancer Maternal Grandmother   . CAD Father   . Hyperlipidemia Sister   . Mental illness Daughter        bipolar  . Cystic fibrosis Daughter   . Breast cancer Maternal Aunt     Past medical history, surgical history, medications, allergies, family history and social history reviewed with patient today and changes made to appropriate areas of the chart.   Review of Systems - negative All other ROS negative except what is listed above and in the HPI.      Objective:    BP 121/74   Pulse (!) 58   Temp 98.4 F (36.9 C)   Ht 5\' 2"  (1.575 m)   Wt 172 lb (78 kg)   LMP  (LMP Unknown)   SpO2 98%   BMI 31.46 kg/m   Wt Readings from Last 3 Encounters:  09/22/20 172 lb (78 kg)  09/08/20 166 lb (75.3 kg)  08/26/20 161 lb (73 kg)    Physical Exam Constitutional:      General: She is awake. She is not in acute distress.    Appearance: She is well-developed. She is not ill-appearing.  HENT:     Head: Normocephalic and atraumatic.     Right Ear: Hearing, tympanic membrane, ear canal and external ear normal. No drainage.      Left Ear: Hearing, tympanic membrane, ear canal and external ear normal. No drainage.     Nose: Nose normal.     Right Sinus: No maxillary sinus tenderness or frontal sinus tenderness.     Left Sinus: No maxillary sinus tenderness or frontal sinus tenderness.     Mouth/Throat:     Mouth: Mucous membranes are moist.     Pharynx: Oropharynx is clear. Uvula midline. No pharyngeal swelling, oropharyngeal exudate or posterior oropharyngeal erythema.  Eyes:     General: Lids are normal.        Right eye: No discharge.        Left eye: No discharge.     Extraocular Movements: Extraocular movements intact.     Conjunctiva/sclera: Conjunctivae normal.     Pupils: Pupils are equal, round, and reactive to light.     Visual Fields: Right eye visual fields normal and left eye visual fields normal.  Neck:     Thyroid: No thyromegaly.     Vascular: No carotid bruit.     Trachea: Trachea normal.  Cardiovascular:     Rate and Rhythm: Normal rate and regular rhythm.     Heart sounds: Normal heart sounds. No murmur heard.  No gallop.   Pulmonary:     Effort: Pulmonary effort is normal. No accessory muscle usage or respiratory distress.     Breath sounds: Normal breath sounds.  Abdominal:     General: Bowel sounds  are normal.     Palpations: Abdomen is soft. There is no hepatomegaly or splenomegaly.     Tenderness: There is no abdominal tenderness.  Musculoskeletal:        General: Normal range of motion.     Cervical back: Normal range of motion and neck supple.     Right lower leg: No edema.     Left lower leg: No edema.  Lymphadenopathy:     Head:     Right side of head: No submental, submandibular, tonsillar, preauricular or posterior auricular adenopathy.     Left side of head: No submental, submandibular, tonsillar, preauricular or posterior auricular adenopathy.     Cervical: No cervical adenopathy.  Skin:    General: Skin is warm and dry.     Capillary Refill: Capillary refill  takes less than 2 seconds.     Findings: No rash.       Neurological:     Mental Status: She is alert and oriented to person, place, and time.     Cranial Nerves: Cranial nerves are intact.     Gait: Gait is intact.     Deep Tendon Reflexes: Reflexes are normal and symmetric.     Reflex Scores:      Brachioradialis reflexes are 2+ on the right side and 2+ on the left side.      Patellar reflexes are 2+ on the right side and 2+ on the left side. Psychiatric:        Attention and Perception: Attention normal.        Mood and Affect: Mood normal.        Speech: Speech normal.        Behavior: Behavior normal. Behavior is cooperative.        Thought Content: Thought content normal.        Judgment: Judgment normal.    Results for orders placed or performed during the hospital encounter of 08/26/20  Surgical pathology  Result Value Ref Range   SURGICAL PATHOLOGY      SURGICAL PATHOLOGY CASE: [id number] PATIENT: Mont Dutton Surgical Pathology Report     Specimen Submitted: A. Colon lipoma, cecum; cbx B. Colon polyp x2, descending  sigmoid; cs  Clinical History: Screening colonoscopy.  Cecum lipoma, colon polyps.      DIAGNOSIS: A. COLON, CECUM LIPOMA; COLD BIOPSY: - COLONIC MUCOSA WITH SUBMUCOSAL ADIPOSE TISSUE, CONSISTENT WITH THE ENDOSCOPIC IMPRESSION OF LIPOMA. - NEGATIVE FOR DYSPLASIA AND MALIGNANCY.  B. COLON POLYP X2, DESCENDING AND SIGMOID; COLD SNARE: - TUBULAR ADENOMA, TWO FRAGMENTS. - NEGATIVE FOR HIGH-GRADE DYSPLASIA AND MALIGNANCY.   GROSS DESCRIPTION: A. Labeled: Cecum lipoma cold biopsies Received: In formalin Tissue fragment(s): 2 Size: 0.7 x 0.2 x 0.1 cm Description: Aggregate of tan tissue fragments Entirely submitted in 1 cassette.  B. Labeled: Descending and sigmoid colon polyp cold snare x2 Received: In formalin Tissue fragment(s): 2 Size: 0.7 x 0.6 x 0.2 cm Description: Aggregate of  pink-red polypoid tissue Entirely submitted in  1 cassette.   Final Diagnosis performed by Betsy Pries, MD.   Electronically signed 08/27/2020 9:31:15AM The electronic signature indicates that the named Attending Pathologist has evaluated the specimen Technical component performed at [facility], [address] [place], [place] [address] Lab: [phone] Dir: Rush Farmer, MD, MMM  Professional component performed at [facility], [facility], [place], [place], [place] [address] Lab: [phone] Dir: Dellia Nims. Reuel Derby, MD       Assessment & Plan:   Problem List Items Addressed  This Visit      Cardiovascular and Mediastinum   Aortic atherosclerosis ([place])    Noted on lumbar imaging in October 2019.  Continue focus on healthy diet and exercise for prevention.  Does not wish to start statin or ASA.        Respiratory   Allergic rhinitis    Chronic, ongoing.  Will send in refills on Flonase and recommend she start Claritin daily to help with postnasal drainage.        Musculoskeletal and Integument   Osteopenia    Noted on imaging in 2017.  Recommend she continue daily Vitamin D supplement and calcium intake.  Will repeat DEXA September 2022.      Relevant Orders   VITAMIN D 25 Hydroxy (Vit-D Deficiency, Fractures)     Other   Hypercholesteremia    Ongoing, refuses statin.  ASCVD 7.9%.  Continue red yeast rice and diet focus.  Return in 6 months.  Lipid panel today.      Relevant Orders   Lipid Panel w/o Chol/HDL Ratio   Depression - Primary    Chronic, stable.  Will continue Celexa 10 MG daily, discussed with patient and educated her on this.  Adjust regimen as needed.  Denies SI/HI.  Return to office in 6 months.      Obesity    BMI 31.46.  Recommended eating smaller high protein, low fat meals more frequently and exercising 30 mins a day 5 times a week with a goal of 10-15lb weight loss in the next 3 months. Patient voiced their understanding and motivation to adhere to these  recommendations.        Other Visit Diagnoses    Encounter for annual physical exam       Annual labs today to include CBC, CMP, TSH, lipid   Relevant Orders   CBC with Differential/Platelet   Comprehensive metabolic panel   TSH   Tinea corporis       Noted on exam to umbilicus, Nystatin cream script sent in.   Relevant Medications   nystatin cream (MYCOSTATIN)   Encounter for screening mammogram for malignant neoplasm of breast       Mammogram ordered.   Relevant Orders   MM DIGITAL SCREENING BILATERAL       Follow up plan: Return in about 6 months (around 03/22/2021) for HLD AND MOOD -- OSTEOPENIA.   LABORATORY TESTING:  - Pap smear: not applicable  IMMUNIZATIONS:   - Tdap: Tetanus vaccination status reviewed: not up to date - Influenza: Up to date - Pneumovax: Up to date - Prevnar: Up to date - HPV: Not applicable - Zostavax vaccine: Up to date  SCREENING: -Mammogram: Up to date  - Colonoscopy: Up to date  - Bone Density: Up to date due next September 2022 due to osteopenia -Hearing Test: Not applicable  -Spirometry: Not applicable   PATIENT COUNSELING:   Advised to take 1 mg of folate supplement per day if capable of pregnancy.   Sexuality: Discussed sexually transmitted diseases, partner selection, use of condoms, avoidance of unintended pregnancy  and contraceptive alternatives.   Advised to avoid cigarette smoking.  I discussed with the patient that most people either abstain from alcohol or drink within safe limits (<=14/week and <=4 drinks/occasion for males, <=7/weeks and <= 3 drinks/occasion for females) and that the risk for alcohol disorders and other health effects rises proportionally with the number of drinks per week and how often a drinker exceeds daily limits.  Discussed cessation/primary prevention  of drug use and availability of treatment for abuse.   Diet: Encouraged to adjust caloric intake to maintain  or achieve ideal body weight, to  reduce intake of dietary saturated fat and total fat, to limit sodium intake by avoiding high sodium foods and not adding table salt, and to maintain adequate dietary potassium and calcium preferably from fresh fruits, vegetables, and low-fat dairy products.    Stressed the importance of regular exercise  Injury prevention: Discussed safety belts, safety helmets, smoke detector, smoking near bedding or upholstery.   Dental health: Discussed importance of regular tooth brushing, flossing, and dental visits.    NEXT PREVENTATIVE PHYSICAL DUE IN 1 YEAR. Return in about 6 months (around 03/22/2021) for HLD AND MOOD -- OSTEOPENIA.

## 2020-09-22 NOTE — Patient Instructions (Signed)
[name]  Address: [address] [place], [place], [place] [address]  Phone: [phone]   DASH Eating Plan DASH stands for "Dietary Approaches to Stop Hypertension." The DASH eating plan is a healthy eating plan that has been shown to reduce high blood pressure (hypertension). It may also reduce your risk for type 2 diabetes, heart disease, and stroke. The DASH eating plan may also help with weight loss. What are tips for following this plan?  General guidelines  Avoid eating more than 2,300 mg (milligrams) of salt (sodium) a day. If you have hypertension, you may need to reduce your sodium intake to 1,500 mg a day.  Limit alcohol intake to no more than 1 drink a day for nonpregnant women and 2 drinks a day for men. One drink equals 12 oz of beer, 5 oz of wine, or 1 oz of hard liquor.  Work with your health care provider to maintain a healthy body weight or to lose weight. Ask what an ideal weight is for you.  Get at least 30 minutes of exercise that causes your heart to beat faster (aerobic exercise) most days of the week. Activities may include walking, swimming, or biking.  Work with your health care provider or diet and nutrition specialist (dietitian) to adjust your eating plan to your individual calorie needs. Reading food labels   Check food labels for the amount of sodium per serving. Choose foods with less than 5 percent of the Daily Value of sodium. Generally, foods with less than 300 mg of sodium per serving fit into this eating plan.  To find whole grains, look for the word "whole" as the first word in the ingredient list. Shopping  Buy products labeled as "low-sodium" or "no salt added."  Buy fresh foods. Avoid canned foods and premade or frozen meals. Cooking  Avoid adding salt when cooking. Use salt-free seasonings or herbs instead of table salt or sea salt. Check with your health care provider or pharmacist before using salt  substitutes.  Do not fry foods. Cook foods using healthy methods such as baking, boiling, grilling, and broiling instead.  Cook with heart-healthy oils, such as olive, canola, soybean, or sunflower oil. Meal planning  Eat a balanced diet that includes: ? 5 or more servings of fruits and vegetables each day. At each meal, try to fill half of your plate with fruits and vegetables. ? Up to 6-8 servings of whole grains each day. ? Less than 6 oz of lean meat, poultry, or fish each day. A 3-oz serving of meat is about the same size as a deck of cards. One egg equals 1 oz. ? 2 servings of low-fat dairy each day. ? A serving of nuts, seeds, or beans 5 times each week. ? Heart-healthy fats. Healthy fats called Omega-3 fatty acids are found in foods such as flaxseeds and coldwater fish, like sardines, salmon, and mackerel.  Limit how much you eat of the following: ? Canned or prepackaged foods. ? Food that is high in trans fat, such as fried foods. ? Food that is high in saturated fat, such as fatty meat. ? Sweets, desserts, sugary drinks, and other foods with added sugar. ? Full-fat dairy products.  Do not salt foods before eating.  Try to eat at least 2 vegetarian meals each week.  Eat more home-cooked food and less restaurant, buffet, and fast food.  When eating at a restaurant, ask that your food be prepared with less salt or  no salt, if possible. What foods are recommended? The items listed may not be a complete list. Talk with your dietitian about what dietary choices are best for you. Grains Whole-grain or whole-wheat bread. Whole-grain or whole-wheat pasta. Brown rice. [place]row. Bulgur. Whole-grain and low-sodium cereals. Pita bread. Low-fat, low-sodium crackers. Whole-wheat flour tortillas. Vegetables Fresh or frozen vegetables (raw, steamed, roasted, or grilled). Low-sodium or reduced-sodium tomato and vegetable juice. Low-sodium or reduced-sodium tomato sauce and tomato  paste. Low-sodium or reduced-sodium canned vegetables. Fruits All fresh, dried, or frozen fruit. Canned fruit in natural juice (without added sugar). Meat and other protein foods Skinless chicken or [place]. Ground chicken or [place]. Pork with fat trimmed off. Fish and seafood. Egg whites. Dried beans, peas, or lentils. Unsalted nuts, nut butters, and seeds. Unsalted canned beans. Lean cuts of beef with fat trimmed off. Low-sodium, lean deli meat. Dairy Low-fat (1%) or fat-free (skim) milk. Fat-free, low-fat, or reduced-fat cheeses. Nonfat, low-sodium ricotta or cottage cheese. Low-fat or nonfat yogurt. Low-fat, low-sodium cheese. Fats and oils Soft margarine without trans fats. Vegetable oil. Low-fat, reduced-fat, or light mayonnaise and salad dressings (reduced-sodium). Canola, safflower, olive, soybean, and sunflower oils. Avocado. Seasoning and other foods Herbs. Spices. Seasoning mixes without salt. Unsalted popcorn and pretzels. Fat-free sweets. What foods are not recommended? The items listed may not be a complete list. Talk with your dietitian about what dietary choices are best for you. Grains Baked goods made with fat, such as croissants, muffins, or some breads. Dry pasta or rice meal packs. Vegetables Creamed or fried vegetables. Vegetables in a cheese sauce. Regular canned vegetables (not low-sodium or reduced-sodium). Regular canned tomato sauce and paste (not low-sodium or reduced-sodium). Regular tomato and vegetable juice (not low-sodium or reduced-sodium). Angie Fava. Olives. Fruits Canned fruit in a light or heavy syrup. Fried fruit. Fruit in cream or butter sauce. Meat and other protein foods Fatty cuts of meat. Ribs. Fried meat. Berniece Salines. Sausage. Bologna and other processed lunch meats. Salami. Fatback. Hotdogs. Bratwurst. Salted nuts and seeds. Canned beans with added salt. Canned or smoked fish. Whole eggs or egg yolks. Chicken or [place] with skin. Dairy Whole or 2% milk,  cream, and half-and-half. Whole or full-fat cream cheese. Whole-fat or sweetened yogurt. Full-fat cheese. Nondairy creamers. Whipped toppings. Processed cheese and cheese spreads. Fats and oils Butter. Stick margarine. Lard. Shortening. Ghee. Bacon fat. Tropical oils, such as coconut, palm kernel, or palm oil. Seasoning and other foods Salted popcorn and pretzels. Onion salt, garlic salt, seasoned salt, table salt, and sea salt. Worcestershire sauce. Tartar sauce. Barbecue sauce. Teriyaki sauce. Soy sauce, including reduced-sodium. Steak sauce. Canned and packaged gravies. Fish sauce. Oyster sauce. Cocktail sauce. Horseradish that you find on the shelf. Ketchup. Mustard. Meat flavorings and tenderizers. Bouillon cubes. Hot sauce and Tabasco sauce. Premade or packaged marinades. Premade or packaged taco seasonings. Relishes. Regular salad dressings. Where to find more information:  National Heart, Lung, and [place]: https://wilson-eaton.com/  American Heart Association: www.heart.org Summary  The DASH eating plan is a healthy eating plan that has been shown to reduce high blood pressure (hypertension). It may also reduce your risk for type 2 diabetes, heart disease, and stroke.  With the DASH eating plan, you should limit salt (sodium) intake to 2,300 mg a day. If you have hypertension, you may need to reduce your sodium intake to 1,500 mg a day.  When on the DASH eating plan, aim to eat more fresh fruits and vegetables, whole grains, lean proteins, low-fat dairy, and  heart-healthy fats.  Work with your health care provider or diet and nutrition specialist (dietitian) to adjust your eating plan to your individual calorie needs. This information is not intended to replace advice given to you by your health care provider. Make sure you discuss any questions you have with your health care provider. Document Revised: 09/23/2017 Document Reviewed: 10/04/2016 Elsevier Patient Education  2020 [employer].

## 2020-09-22 NOTE — Assessment & Plan Note (Addendum)
BMI 31.46.  Recommended eating smaller high protein, low fat meals more frequently and exercising 30 mins a day 5 times a week with a goal of 10-15lb weight loss in the next 3 months. Patient voiced their understanding and motivation to adhere to these recommendations. ° °

## 2020-09-23 LAB — CBC WITH DIFFERENTIAL/PLATELET
Basophils Absolute: 0 10*3/uL (ref 0.0–0.2)
Basos: 1 %
EOS (ABSOLUTE): 0.2 10*3/uL (ref 0.0–0.4)
Eos: 3 %
Hematocrit: 38.8 % (ref 34.0–46.6)
Hemoglobin: 13.1 g/dL (ref 11.1–15.9)
Immature Grans (Abs): 0 10*3/uL (ref 0.0–0.1)
Immature Granulocytes: 1 %
Lymphocytes Absolute: 2.1 10*3/uL (ref 0.7–3.1)
Lymphs: 33 %
MCH: 30.5 pg (ref 26.6–33.0)
MCHC: 33.8 g/dL (ref 31.5–35.7)
MCV: 90 fL (ref 79–97)
Monocytes Absolute: 0.6 10*3/uL (ref 0.1–0.9)
Monocytes: 10 %
Neutrophils Absolute: 3.4 10*3/uL (ref 1.4–7.0)
Neutrophils: 52 %
Platelets: 301 10*3/uL (ref 150–450)
RBC: 4.29 x10E6/uL (ref 3.77–5.28)
RDW: 13 % (ref 11.7–15.4)
WBC: 6.3 10*3/uL (ref 3.4–10.8)

## 2020-09-23 LAB — VITAMIN D 25 HYDROXY (VIT D DEFICIENCY, FRACTURES): Vit D, 25-Hydroxy: 38.6 ng/mL (ref 30.0–100.0)

## 2020-09-23 LAB — COMPREHENSIVE METABOLIC PANEL
ALT: 32 IU/L (ref 0–32)
AST: 24 IU/L (ref 0–40)
Albumin/Globulin Ratio: 2 (ref 1.2–2.2)
Albumin: 4.3 g/dL (ref 3.8–4.8)
Alkaline Phosphatase: 76 IU/L (ref 44–121)
BUN/Creatinine Ratio: 22 (ref 12–28)
BUN: 17 mg/dL (ref 8–27)
Bilirubin Total: 0.5 mg/dL (ref 0.0–1.2)
CO2: 24 mmol/L (ref 20–29)
Calcium: 9.3 mg/dL (ref 8.7–10.3)
Chloride: 103 mmol/L (ref 96–106)
Creatinine, Ser: 0.79 mg/dL (ref 0.57–1.00)
GFR calc Af Amer: 88 mL/min/{1.73_m2} (ref >59)
GFR calc non Af Amer: 77 mL/min/{1.73_m2} (ref >59)
Globulin, Total: 2.2 g/dL (ref 1.5–4.5)
Glucose: 91 mg/dL (ref 65–99)
Potassium: 4 mmol/L (ref 3.5–5.2)
Sodium: 139 mmol/L (ref 134–144)
Total Protein: 6.5 g/dL (ref 6.0–8.5)

## 2020-09-23 LAB — LIPID PANEL W/O CHOL/HDL RATIO
Cholesterol, Total: 302 mg/dL — ABNORMAL HIGH (ref 100–199)
HDL: 84 mg/dL (ref >39)
LDL Chol Calc (NIH): 196 mg/dL — ABNORMAL HIGH (ref 0–99)
Triglycerides: 125 mg/dL (ref 0–149)
VLDL Cholesterol Cal: 22 mg/dL (ref 5–40)

## 2020-09-23 LAB — TSH: TSH: 2.27 u[IU]/mL (ref 0.450–4.500)

## 2020-09-23 NOTE — Progress Notes (Signed)
Contacted via MyChart The 10-year ASCVD risk score [name] DC Jr., et al., 2013) is: 8%   Values used to calculate the score:     Age: 69 years     Sex: Female     Is Non-Hispanic African American: No     Diabetic: No     Tobacco smoker: No     Systolic Blood Pressure: 567 mmHg     Is BP treated: No     HDL Cholesterol: 84 mg/dL     Total Cholesterol: 302 mg/dL  Good morning Breanna Davidson, your labs have returned and overall look great with exception of cholesterol levels which remain elevated.  Of course recommendation is starting medication for this, but I know you prefer focus on diet and regular activity, so we can recheck next visit.  Your LDL is above normal. The LDL is the bad cholesterol. Over time and in combination with inflammation and other factors, this contributes to plaque which in turn may lead to stroke and/or heart attack down the road. Sometimes high LDL is primarily genetic, and people might be eating all the right foods but still have high numbers. Other times, there is room for improvement in one's diet and eating healthier can bring this number down and potentially reduce one's risk of heart attack and/or stroke.   To reduce your LDL, Remember - more fruits and vegetables, more fish, and limit red meat and dairy products. More soy, nuts, beans, barley, lentils, oats and plant sterol ester enriched margarine instead of butter. I also encourage eliminating sugar and processed food. Remember, shop on the outside of the grocery store and visit your [employer]. If you would like to talk with me about dietary changes plus or minus medications for your cholesterol, please let me know. We should recheck your cholesterol in 6 months.  Any questions about your labs?  Have a safe trip to [place] and enjoy every moment.  I wish I could go with you, beautiful country with fantastic food and wine:) Keep being awesome!!  Thank you for allowing me to participate in your care. Kindest  regards, Breanna Davidson

## 2020-10-07 ENCOUNTER — Other Ambulatory Visit: Payer: Self-pay

## 2020-10-07 ENCOUNTER — Ambulatory Visit
Admission: RE | Admit: 2020-10-07 | Discharge: 2020-10-07 | Disposition: A | Discharge status: Home / Self Care | Payer: Medicare Other | Source: Ambulatory Visit | Attending: Nurse Practitioner | Admitting: Nurse Practitioner

## 2020-10-07 DIAGNOSIS — Z1231 Encounter for screening mammogram for malignant neoplasm of breast: Secondary | ICD-10-CM | POA: Diagnosis not present

## 2020-10-08 DIAGNOSIS — Z03818 Encounter for observation for suspected exposure to other biological agents ruled out: Secondary | ICD-10-CM | POA: Diagnosis not present

## 2020-10-08 DIAGNOSIS — Z20822 Contact with and (suspected) exposure to covid-19: Secondary | ICD-10-CM | POA: Diagnosis not present

## 2020-11-25 ENCOUNTER — Ambulatory Visit (INDEPENDENT_AMBULATORY_CARE_PROVIDER_SITE_OTHER): Payer: Medicare Other | Admitting: Nurse Practitioner

## 2020-11-25 ENCOUNTER — Other Ambulatory Visit: Payer: Self-pay

## 2020-11-25 ENCOUNTER — Encounter: Payer: Self-pay | Admitting: Nurse Practitioner

## 2020-11-25 VITALS — BP 130/81 | HR 80 | Temp 98.2°F | Ht 61.1 in | Wt 170.6 lb

## 2020-11-25 DIAGNOSIS — B9689 Other specified bacterial agents as the cause of diseases classified elsewhere: Secondary | ICD-10-CM

## 2020-11-25 DIAGNOSIS — N760 Acute vaginitis: Secondary | ICD-10-CM | POA: Diagnosis not present

## 2020-11-25 DIAGNOSIS — M545 Low back pain, unspecified: Secondary | ICD-10-CM | POA: Diagnosis not present

## 2020-11-25 LAB — URINALYSIS, ROUTINE W REFLEX MICROSCOPIC
Bilirubin, UA: NEGATIVE
Glucose, UA: NEGATIVE
Ketones, UA: NEGATIVE
Nitrite, UA: NEGATIVE
Protein,UA: NEGATIVE
Specific Gravity, UA: 1.025 (ref 1.005–1.030)
Urobilinogen, Ur: 0.2 mg/dL (ref 0.2–1.0)
pH, UA: 6 (ref 5.0–7.5)

## 2020-11-25 LAB — MICROSCOPIC EXAMINATION: Bacteria, UA: NONE SEEN

## 2020-11-25 LAB — WET PREP FOR TRICH, YEAST, CLUE
Clue Cell Exam: POSITIVE — AB
Trichomonas Exam: NEGATIVE
Yeast Exam: NEGATIVE

## 2020-11-25 MED ORDER — METRONIDAZOLE 500 MG PO TABS: 500.0000 mg | ORAL_TABLET | Freq: Two times a day (BID) | ORAL | 0 refills | Status: AC

## 2020-11-25 NOTE — Patient Instructions (Signed)
Bacterial Vaginosis  Bacterial vaginosis is an infection of the vagina. It happens when too many normal germs (healthy bacteria) grow in the vagina. This infection can make it easier to get other infections from sex (STIs). It is very important for pregnant women to get treated. This infection can cause babies to be born early or at a low birth weight. What are the causes? This infection is caused by an increase in certain germs that grow in the vagina. You cannot get this infection from toilet seats, bedsheets, swimming pools, or things that touch your vagina. What increases the risk?  Having sex with a new person or more than one person.  Having sex without protection.  Douching.  Having an intrauterine device (IUD).  Smoking.  Using drugs or drinking alcohol. These can lead you to do things that are risky.  Taking certain antibiotic medicines.  Being pregnant. What are the signs or symptoms? Some women have no symptoms. Symptoms may include:  A discharge from your vagina. It may be gray or white. It can be watery or foamy.  A fishy smell. This can happen after sex or during your menstrual period.  Itching in and around your vagina.  A feeling of burning or pain when you pee (urinate). How is this treated? This infection is treated with antibiotic medicines. These may be given to you as:  A pill.  A cream for your vagina.  A medicine that you put into your vagina (suppository). If the infection comes back after treatment, you may need more antibiotics. Follow these instructions at home: Medicines  Take over-the-counter and prescription medicines as told by your doctor.  Take or use your antibiotic medicine as told by your doctor. Do not stop taking or using it, even if you start to feel better. General instructions  If the person you have sex with is a woman, tell her that you have this infection. She will need to follow up with her doctor. If you have a female  partner, he does not need to be treated.  Do not have sex until you finish treatment.  Drink enough fluid to keep your pee pale yellow.  Keep your vagina and butt clean. ? Wash the area with warm water each day. ? Wipe from front to back after you use the toilet.  If you are breastfeeding a baby, ask your doctor if you should keep doing so during treatment.  Keep all follow-up visits. How is this prevented? Self-care  Do not douche.  Use only warm water to wash around your vagina.  Wear underwear that is cotton or lined with cotton.  Do not wear tight pants and pantyhose, especially in the summer. Safe sex  Use protection when you have sex. This includes: ? Use condoms. ? Use dental dams. This is a thin layer that protects the mouth during oral sex.  Limit how many people you have sex with. To prevent this infection, it is best to have sex with just one person.  Get tested for STIs. The person you have sex with should also get tested. Drugs and alcohol  Do not smoke or use any products that contain nicotine or tobacco. If you need help quitting, ask your doctor.  Do not use drugs.  Do not drink alcohol if: ? Your doctor tells you not to drink. ? You are pregnant, may be pregnant, or are planning to become pregnant.  If you drink alcohol: ? Limit how much you have to 0-1 drink   a day. ? Know how much alcohol is in your drink. In the [place] one drink equals one 12 oz bottle of beer (355 mL), one 5 oz glass of wine (148 mL), or one 1 oz glass of hard liquor (44 mL). Where to find more information  Centers for Disease Control and Prevention: www.cdc.gov  American Sexual Health Association: www.ashastd.org  Office on Women's Health: www.womenshealth.gov Contact a doctor if:  Your symptoms do not get better, even after you are treated.  You have more discharge or pain when you pee.  You have a fever or chills.  You have pain in your belly (abdomen) or in the area  between your hips.  You have pain with sex.  You bleed from your vagina between menstrual periods. Summary  This infection can happen when too many germs (bacteria) grow in the vagina.  This infection can make it easier to get infections from sex (STIs). Treating this can lower that chance.  Get treated if you are pregnant. This infection can cause babies to be born early.  Do not stop taking or using your antibiotic medicine, even if you start to feel better. This information is not intended to replace advice given to you by your health care provider. Make sure you discuss any questions you have with your health care provider. Document Revised: 04/10/2020 Document Reviewed: 04/10/2020 Elsevier Patient Education  2021 Elsevier Inc.  

## 2020-11-25 NOTE — Progress Notes (Signed)
BP 130/81   Pulse 80   Temp 98.2 F (36.8 C) (Oral)   Ht 5' 1.1" (1.552 m)   Wt 170 lb 9.6 oz (77.4 kg)   LMP  (LMP Unknown)   SpO2 99%   BMI 32.13 kg/m    Subjective:    Patient ID: Breanna Davidson, female    DOB: [birth year], 70 y.o.   MRN: [id number]  HPI: Breanna Davidson is a 70 y.o. female  Chief Complaint  Patient presents with  . Abdominal Pain    Goes all around to back, especially on left side and after she eats. Is more painful sitting and only good position is to lay down   ABDOMINAL PAIN  Reports discomfort to left side abdomen and radiates around back and to right side of abdomen, noted more after she eats.  Has more discomfort with sitting, feels better to lie down.  This has been going on 10 days off and on -- hard to sit, was a bit better, but now returned and a little worse.  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   Duration:weeks Onset: gradual Severity: 8/10 Quality: dull and aching -- occasional sharper pains Location: generalized Episode duration:  Radiation: no Frequency: intermittent Alleviating factors: lying down Aggravating factors: sitting and eating Status: fluctuating Treatments attempted: none Fever: no Nausea: no Vomiting: no Weight loss: no Decreased appetite: a little bit Diarrhea: yes -- was taking Miral[place] in [place] due to constipation and when [facility] then started with diarrhea this morning x 1 episode Constipation: as above Blood in stool: no Heartburn: no Jaundice: no Rash: no Dysuria/urinary frequency: no Hematuria: no History of sexually transmitted disease: no Recurrent NSAID use: no   Relevant past medical, surgical, family and social history reviewed and updated as indicated. Interim medical history since our last visit reviewed. Allergies and medications reviewed and updated.  Review of Systems  Constitutional: Negative for activity change, appetite change, diaphoresis,  fatigue and fever.  Respiratory: Negative for cough, chest tightness and shortness of breath.   Cardiovascular: Negative for chest pain, palpitations and leg swelling.  Gastrointestinal: Positive for abdominal pain, constipation and diarrhea. Negative for abdominal distention, nausea and vomiting.  Genitourinary: Negative for decreased urine volume, dysuria, flank pain, hematuria, urgency and vaginal discharge.  Musculoskeletal: Negative for arthralgias.  Neurological: Negative.   Psychiatric/Behavioral: Negative.     Per HPI unless specifically indicated above     Objective:    BP 130/81   Pulse 80   Temp 98.2 F (36.8 C) (Oral)   Ht 5' 1.1" (1.552 m)   Wt 170 lb 9.6 oz (77.4 kg)   LMP  (LMP Unknown)   SpO2 99%   BMI 32.13 kg/m   Wt Readings from Last 3 Encounters:  11/25/20 170 lb 9.6 oz (77.4 kg)  09/22/20 172 lb (78 kg)  09/08/20 166 lb (75.3 kg)    Physical Exam Vitals and nursing note reviewed.  Constitutional:      General: She is awake. She is not in acute distress.    Appearance: She is well-developed and well-groomed. She is obese. She is not ill-appearing.  HENT:     Head: Normocephalic.     Right Ear: Hearing normal.     Left Ear: Hearing normal.  Eyes:     General: Lids are normal.        Right eye: No discharge.        Left eye: No  discharge.     Conjunctiva/sclera: Conjunctivae normal.     Pupils: Pupils are equal, round, and reactive to light.  Neck:     Thyroid: No thyromegaly.     Vascular: No carotid bruit.  Cardiovascular:     Rate and Rhythm: Normal rate and regular rhythm.     Heart sounds: Normal heart sounds. No murmur heard. No gallop.   Pulmonary:     Effort: Pulmonary effort is normal. No accessory muscle usage or respiratory distress.     Breath sounds: Normal breath sounds.  Abdominal:     General: Bowel sounds are normal.     Palpations: Abdomen is soft. There is no hepatomegaly or splenomegaly.     Tenderness: There is abdominal  tenderness in the suprapubic area. There is no right CVA tenderness or left CVA tenderness.  Musculoskeletal:     Cervical back: Normal range of motion and neck supple.     Right lower leg: No edema.     Left lower leg: No edema.  Skin:    General: Skin is warm and dry.  Neurological:     Mental Status: She is alert and oriented to person, place, and time.  Psychiatric:        Attention and Perception: Attention normal.        Mood and Affect: Mood normal.        Speech: Speech normal.        Behavior: Behavior normal. Behavior is cooperative.        Thought Content: Thought content normal.    Results for orders placed or performed in visit on 09/22/20  CBC with Differential/Platelet  Result Value Ref Range   WBC 6.3 3.4 - 10.8 x10E3/uL   RBC 4.29 3.77 - 5.28 x10E6/uL   Hemoglobin 13.1 11.1 - 15.9 g/dL   Hematocrit 38.8 34.0 - 46.6 %   MCV 90 79 - 97 fL   MCH 30.5 26.6 - 33.0 pg   MCHC 33.8 31.5 - 35.7 g/dL   RDW 13.0 11.7 - 15.4 %   Platelets 301 150 - 450 x10E3/uL   Neutrophils 52 Not Estab. %   Lymphs 33 Not Estab. %   Monocytes 10 Not Estab. %   Eos 3 Not Estab. %   Basos 1 Not Estab. %   Neutrophils Absolute 3.4 1.4 - 7.0 x10E3/uL   Lymphocytes Absolute 2.1 0.7 - 3.1 x10E3/uL   Monocytes Absolute 0.6 0.1 - 0.9 x10E3/uL   EOS (ABSOLUTE) 0.2 0.0 - 0.4 x10E3/uL   Basophils Absolute 0.0 0.0 - 0.2 x10E3/uL   Immature Granulocytes 1 Not Estab. %   Immature Grans (Abs) 0.0 0.0 - 0.1 x10E3/uL  Comprehensive metabolic panel  Result Value Ref Range   Glucose 91 65 - 99 mg/dL   BUN 17 8 - 27 mg/dL   Creatinine, Ser 0.79 0.57 - 1.00 mg/dL   GFR calc non Af Amer 77 >59 mL/min/1.73   GFR calc Af Amer 88 >59 mL/min/1.73   BUN/Creatinine Ratio 22 12 - 28   Sodium 139 134 - 144 mmol/L   Potassium 4.0 3.5 - 5.2 mmol/L   Chloride 103 96 - 106 mmol/L   CO2 24 20 - 29 mmol/L   Calcium 9.3 8.7 - 10.3 mg/dL   Total Protein 6.5 6.0 - 8.5 g/dL   Albumin 4.3 3.8 - 4.8 g/dL    Globulin, Total 2.2 1.5 - 4.5 g/dL   Albumin/Globulin Ratio 2.0 1.2 - 2.2   Bilirubin Total 0.5 0.0 -  1.2 mg/dL   Alkaline Phosphatase 76 44 - 121 IU/L   AST 24 0 - 40 IU/L   ALT 32 0 - 32 IU/L  Lipid Panel w/o Chol/HDL Ratio  Result Value Ref Range   Cholesterol, Total 302 (H) 100 - 199 mg/dL   Triglycerides 125 0 - 149 mg/dL   HDL 84 >39 mg/dL   VLDL Cholesterol Cal 22 5 - 40 mg/dL   LDL Chol Calc (NIH) 196 (H) 0 - 99 mg/dL  TSH  Result Value Ref Range   TSH 2.270 0.450 - 4.500 uIU/mL  VITAMIN D 25 Hydroxy (Vit-D Deficiency, Fractures)  Result Value Ref Range   Vit D, 25-Hydroxy 38.6 30.0 - 100.0 ng/mL      Assessment & Plan:   Problem List Items Addressed This Visit      Genitourinary   Bacterial vaginosis - Primary    Acute x 10 days -- presented with abdominal pain.  UA trace BLD and 1+ LEUK.  Wet prep + Clue Cells and negative trich and yeast.  Educated her on bacterial vaginosis + symptoms and causes.  Script for Flagyl 500 MG BID x 7 days and educated her on this medication -- no alcohol while taking and to take with food.  Recommend she return to office for any ongoing or worsening of symptoms.        Relevant Medications   metroNIDAZOLE (FLAGYL) 500 MG tablet   Other Relevant Orders   WET PREP FOR TRICH, YEAST, CLUE   Urinalysis, Routine w reflex microscopic       Follow up plan: Return if symptoms worsen or fail to improve.

## 2020-11-25 NOTE — Assessment & Plan Note (Signed)
Acute x 10 days -- presented with abdominal pain.  UA trace BLD and 1+ LEUK.  Wet prep + Clue Cells and negative trich and yeast.  Educated her on bacterial vaginosis + symptoms and causes.  Script for Flagyl 500 MG BID x 7 days and educated her on this medication -- no alcohol while taking and to take with food.  Recommend she return to office for any ongoing or worsening of symptoms.

## 2021-01-30 DIAGNOSIS — H40053 Ocular hypertension, bilateral: Secondary | ICD-10-CM | POA: Diagnosis not present

## 2021-02-23 DIAGNOSIS — H40053 Ocular hypertension, bilateral: Secondary | ICD-10-CM | POA: Diagnosis not present

## 2021-03-20 ENCOUNTER — Ambulatory Visit: Payer: Self-pay | Admitting: *Deleted

## 2021-03-20 DIAGNOSIS — Z20822 Contact with and (suspected) exposure to covid-19: Secondary | ICD-10-CM | POA: Diagnosis not present

## 2021-03-20 DIAGNOSIS — Z03818 Encounter for observation for suspected exposure to other biological agents ruled out: Secondary | ICD-10-CM | POA: Diagnosis not present

## 2021-03-20 NOTE — Telephone Encounter (Signed)
Please advise 

## 2021-03-20 NOTE — Telephone Encounter (Signed)
Called pt she states that she think she will wait and see how does. She states that if she starts to feel worse she will go to [facility]

## 2021-03-20 NOTE — Telephone Encounter (Signed)
  Patient is calling to report she has tested + COVID. Patient advised per COVID protocol to treat symptoms as needed and contact provider for follow up if not getting better- due to patient age and medical history- she is at higher risk- call sent for provider review to see if she should have treatment ordered. Symptoms: low grade temperature, cough, congestion, fatigue, chest pressure form cough/congestion. Reason for Disposition . [1] HIGH RISK for severe COVID complications (e.g., weak immune system, age > 46 years, obesity with BMI > 25, pregnant, chronic lung disease or other chronic medical condition) AND [2] COVID symptoms (e.g., cough, fever)  (Exceptions: Already seen by PCP and no new or worsening symptoms.)  Answer Assessment - Initial Assessment Questions 1. COVID-19 DIAGNOSIS: "Who made your COVID-19 diagnosis?" "Was it confirmed by a positive lab test or self-test?" If not diagnosed by a doctor (or NP/PA), ask "Are there lots of cases (community spread) where you live?" Note: See public health department website, if unsure.     Diagnosed lab test- 03/20/21 2. COVID-19 EXPOSURE: "Was there any known exposure to COVID before the symptoms began?" CDC Definition of close contact: within 6 feet (2 meters) for a total of 15 minutes or more over a 24-hour period.      recent travel 3. ONSET: "When did the COVID-19 symptoms start?"      yesterday 4. WORST SYMPTOM: "What is your worst symptom?" (e.g., cough, fever, shortness of breath, muscle aches)     cough 5. COUGH: "Do you have a cough?" If Yes, ask: "How bad is the cough?"       Yes- coughing spells 6. FEVER: "Do you have a fever?" If Yes, ask: "What is your temperature, how was it measured, and when did it start?"     This morning-99 7. RESPIRATORY STATUS: "Describe your breathing?" (e.g., shortness of breath, wheezing, unable to speak)      No breathing problems- except with cough- does have pressure in chest 8. BETTER-SAME-WORSE: "Are  you getting better, staying the same or getting worse compared to yesterday?"  If getting worse, ask, "In what way?"     Worse- increased symptoms 9. HIGH RISK DISEASE: "Do you have any chronic medical problems?" (e.g., asthma, heart or lung disease, weak immune system, obesity, etc.)     Age, heart disease  10. VACCINE: "Have you had the COVID-19 vaccine?" If Yes, ask: "Which one, how many shots, when did you get it?"       Yes- pfizer  11. BOOSTER: "Have you received your COVID-19 booster?" If Yes, ask: "Which one and when did you get it?"       Yes- 08/13/20 12. PREGNANCY: "Is there any chance you are pregnant?" "When was your last menstrual period?"       n/a 13. OTHER SYMPTOMS: "Do you have any other symptoms?"  (e.g., chills, fatigue, headache, loss of smell or taste, muscle pain, sore throat)       Fatigue, headache,cough 14. O2 SATURATION MONITOR:  "Do you use an oxygen saturation monitor (pulse oximeter) at home?" If Yes, ask "What is your reading (oxygen level) today?" "What is your usual oxygen saturation reading?" (e.g., 95%)       no  Protocols used: CORONAVIRUS (COVID-19) DIAGNOSED OR SUSPECTED-A-AH

## 2021-03-20 NOTE — Telephone Encounter (Signed)
Informed Laural Roes to call patient and recommend urgent care as at end of day in clinic and would benefit visit and possible treatment if appropriate.

## 2021-03-26 DIAGNOSIS — Z20822 Contact with and (suspected) exposure to covid-19: Secondary | ICD-10-CM | POA: Diagnosis not present

## 2021-03-26 DIAGNOSIS — Z03818 Encounter for observation for suspected exposure to other biological agents ruled out: Secondary | ICD-10-CM | POA: Diagnosis not present

## 2021-03-30 ENCOUNTER — Encounter: Payer: Self-pay | Admitting: Internal Medicine

## 2021-03-30 ENCOUNTER — Ambulatory Visit
Admission: RE | Admit: 2021-03-30 | Discharge: 2021-03-30 | Disposition: A | Discharge status: Home / Self Care | Payer: Medicare Other | Source: Home / Self Care | Attending: Internal Medicine | Admitting: Internal Medicine

## 2021-03-30 ENCOUNTER — Other Ambulatory Visit: Payer: Self-pay

## 2021-03-30 ENCOUNTER — Telehealth (INDEPENDENT_AMBULATORY_CARE_PROVIDER_SITE_OTHER): Payer: Medicare Other | Admitting: Internal Medicine

## 2021-03-30 ENCOUNTER — Ambulatory Visit
Admission: RE | Admit: 2021-03-30 | Discharge: 2021-03-30 | Disposition: A | Discharge status: Home / Self Care | Payer: Medicare Other | Source: Ambulatory Visit | Attending: Internal Medicine | Admitting: Internal Medicine

## 2021-03-30 VITALS — BP 132/79 | HR 88

## 2021-03-30 DIAGNOSIS — R059 Cough, unspecified: Secondary | ICD-10-CM | POA: Diagnosis not present

## 2021-03-30 DIAGNOSIS — R053 Chronic cough: Secondary | ICD-10-CM | POA: Insufficient documentation

## 2021-03-30 DIAGNOSIS — U099 Post covid-19 condition, unspecified: Secondary | ICD-10-CM | POA: Insufficient documentation

## 2021-03-30 MED ORDER — AZITHROMYCIN 250 MG PO TABS: ORAL_TABLET | ORAL | 0 refills | Status: AC

## 2021-03-30 MED ORDER — FEXOFENADINE HCL 180 MG PO TABS: 180.0000 mg | ORAL_TABLET | Freq: Every day | ORAL | 0 refills | Status: DC

## 2021-03-30 NOTE — Progress Notes (Signed)
LMP  (LMP Unknown)    Subjective:    Patient ID: Breanna Davidson, female    DOB: 08/07/1951, 70 y.o.   MRN: [id number]  No chief complaint on file.   HPI: Breanna Davidson is a 70 y.o. female   This visit was completed via video visit through MyChart due to the restrictions of the COVID-19 pandemic. All issues as above were discussed and addressed. Physical exam was done as above through visual confirmation on video through MyChart. If it was felt that the patient should be evaluated in the office, they were directed there. The patient verbally consented to this visit. Location of the patient: home Location of the provider: work Those involved with this call:  Provider: Charlynne Cousins, MD CMA: Frazier Butt, Pound Desk/Registration: Jill Side  Time spent on call: 8 minutes with patient face to face via video conference. More than 50% of this time was spent in counseling and coordination of care. 8 minutes total spent in review of patient's record and preparation of their chart.  Was on the 26th of may was diagnosed with COVID , called the office.  PCR was positive on the 26th and 2nd June.   URI  This is a recurrent (coughing ,  headache + last week , ear was blocked, face and teeth were hurting, cough + bottom of belly hurt took flonase, psudaphed , delsym and tyelnol ) problem. The current episode started in the past 7 days (fever is gone still coughing - has had some beige - white colour). There has been no fever. Associated symptoms include chest pain, congestion, coughing, headaches and a plugged ear sensation. Pertinent negatives include no abdominal pain, diarrhea, dysuria, ear pain, joint pain, joint swelling, nausea, neck pain, rhinorrhea, sinus pain, sneezing, sore throat, swollen glands or vomiting.    No chief complaint on file.   Relevant past medical, surgical, family and social history reviewed and updated as indicated. Interim medical history since our last  visit reviewed. Allergies and medications reviewed and updated.  Review of Systems  HENT: Positive for congestion. Negative for ear pain, rhinorrhea, sinus pain, sneezing and sore throat.   Respiratory: Positive for cough.   Cardiovascular: Positive for chest pain.  Gastrointestinal: Negative for abdominal pain, diarrhea, nausea and vomiting.  Genitourinary: Negative for dysuria.  Musculoskeletal: Negative for joint pain and neck pain.  Neurological: Positive for headaches.    Per HPI unless specifically indicated above     Objective:    LMP  (LMP Unknown)   Wt Readings from Last 3 Encounters:  11/25/20 170 lb 9.6 oz (77.4 kg)  09/22/20 172 lb (78 kg)  09/08/20 166 lb (75.3 kg)    Physical Exam Vitals reviewed: not perfomed since virtual      Results for orders placed or performed in visit on 11/25/20  WET PREP FOR [place], YEAST, CLUE   Specimen: Sterile Swab   Sterile Swab  Result Value Ref Range   Trichomonas Exam Negative Negative   Yeast Exam Negative Negative   Clue Cell Exam Positive (A) Negative  Microscopic Examination   Urine  Result Value Ref Range   WBC, UA 6-10 (A) 0 - 5 /hpf   RBC 3-10 (A) 0 - 2 /hpf   Epithelial Cells (non renal) 0-10 0 - 10 /hpf   Bacteria, UA None seen None seen/Few  Urinalysis, Routine w reflex microscopic  Result Value Ref Range   Specific Gravity, UA 1.025 1.005 - 1.030   pH,  UA 6.0 5.0 - 7.5   Color, UA Yellow Yellow   Appearance Ur Clear Clear   Leukocytes,UA 1+ (A) Negative   Protein,UA Negative Negative/Trace   Glucose, UA Negative Negative   Ketones, UA Negative Negative   RBC, UA Trace (A) Negative   Bilirubin, UA Negative Negative   Urobilinogen, Ur 0.2 0.2 - 1.0 mg/dL   Nitrite, UA Negative Negative   Microscopic Examination See below:         Current Outpatient Medications:  .  CALCIUM-MAGNESIUM PO, Take 1 tablet by mouth daily., Disp: , Rfl:  .  Cholecalciferol (VITAMIN D) 50 MCG (2000 UT) CAPS, Take 2,000  Units by mouth daily. , Disp: , Rfl:  .  citalopram (CELEXA) 10 MG tablet, Take 1 tablet (10 mg total) by mouth daily., Disp: 90 tablet, Rfl: 4 .  Coenzyme Q10-Red Yeast Rice (CO Q-10 PLUS RED YEAST RICE PO), Take 1 capsule by mouth daily., Disp: , Rfl:  .  Dorzolamide HCl-Timolol Mal PF 2-0.5 % SOLN, Apply 1 drop to eye daily., Disp: , Rfl:  .  fluticasone (FLONASE) 50 MCG/ACT nasal spray, Place 2 sprays into both nostrils daily., Disp: 16 g, Rfl: 6 .  nystatin cream (MYCOSTATIN), Apply 1 application topically 2 (two) times daily. (Patient not taking: Reported on 11/25/2020), Disp: 30 g, Rfl: 1 .  Omega-3 Fatty Acids (FISH OIL) 1000 MG CAPS, Take 1,000 mg by mouth daily. , Disp: , Rfl:     Assessment & Plan:  COVID : positive :  Increase fluid intake.  Headahce - tyelnol every 4-6 hrs prn and alternate this with ibubrufen 800 mg q 8 hrly.  Sinus pressure: use steam inhalation.  OTC -  Allegra / claritin.      Problem List Items Addressed This Visit   None   Visit Diagnoses    Post-COVID chronic cough    -  Primary   Relevant Orders   DG Chest 2 View       Follow up plan: No follow-ups on file.

## 2021-04-13 ENCOUNTER — Encounter: Payer: Self-pay | Admitting: Nurse Practitioner

## 2021-04-13 ENCOUNTER — Telehealth: Payer: Self-pay

## 2021-04-13 ENCOUNTER — Ambulatory Visit (INDEPENDENT_AMBULATORY_CARE_PROVIDER_SITE_OTHER): Payer: Medicare Other | Admitting: Nurse Practitioner

## 2021-04-13 ENCOUNTER — Other Ambulatory Visit: Payer: Self-pay

## 2021-04-13 VITALS — BP 124/76 | HR 59 | Temp 98.4°F | Ht 62.1 in | Wt 172.2 lb

## 2021-04-13 DIAGNOSIS — Z8616 Personal history of COVID-19: Secondary | ICD-10-CM | POA: Insufficient documentation

## 2021-04-13 DIAGNOSIS — I700 Atherosclerosis of aorta: Secondary | ICD-10-CM

## 2021-04-13 DIAGNOSIS — F330 Major depressive disorder, recurrent, mild: Secondary | ICD-10-CM | POA: Diagnosis not present

## 2021-04-13 DIAGNOSIS — Z23 Encounter for immunization: Secondary | ICD-10-CM

## 2021-04-13 DIAGNOSIS — Z7184 Encounter for health counseling related to travel: Secondary | ICD-10-CM | POA: Insufficient documentation

## 2021-04-13 DIAGNOSIS — T148XXA Other injury of unspecified body region, initial encounter: Secondary | ICD-10-CM

## 2021-04-13 DIAGNOSIS — E7800 Pure hypercholesterolemia, unspecified: Secondary | ICD-10-CM

## 2021-04-13 MED ORDER — ATOVAQUONE-PROGUANIL HCL 250-100 MG PO TABS: 1.0000 | ORAL_TABLET | Freq: Every day | ORAL | 0 refills | Status: DC

## 2021-04-13 MED ORDER — ZOLPIDEM TARTRATE 5 MG PO TABS: 5.0000 mg | ORAL_TABLET | Freq: Every evening | ORAL | 0 refills | Status: DC | PRN

## 2021-04-13 NOTE — Assessment & Plan Note (Signed)
Chronic, stable on celexa. Continue current regimen. PHQ 9 today is a 4, however her fatigue is most likely related to covid-19. Follow up in 6 months.

## 2021-04-13 NOTE — Telephone Encounter (Signed)
done

## 2021-04-13 NOTE — Telephone Encounter (Signed)
PT VERBALIZED UNDERSTANDING.

## 2021-04-13 NOTE — Assessment & Plan Note (Addendum)
Traveling to [place] and [place]. Will give 10 tablets of ambien for travel. PDMP reviewed. Risks/benefits reviewed. Will vaccinate hepatitis A/B and treat with malonone as recommended by the CDC. Will schedule a nurse visit for 1 month for second dose of hepatitis B vaccine, and then in 6 months for final hepatitis A and B vaccines.

## 2021-04-13 NOTE — Assessment & Plan Note (Addendum)
Had covid-19 3 weeks ago. She has recovered well with the exception of lingering fatigue. Discussed that fatigue can last for several months after covid infection. She can slowly increase activity and take rest breaks as needed. She still has some ear congestion to her left ear with no infection noted. She can take zyrtec or claritin to help with symptoms.

## 2021-04-13 NOTE — Assessment & Plan Note (Signed)
Chronic, aortic atherosclerosis noted on lumbar imaging. She is hesitant to start statins and is currently focusing on diet, exercise, and taking an omega 3 supplement. Will check lipid panel today.

## 2021-04-13 NOTE — Progress Notes (Signed)
Established Patient Office Visit  Subjective:  Patient ID: Breanna Davidson, female    DOB: Jun 22, 1951  Age: 70 y.o. MRN: [id number]  CC:  Chief Complaint  Patient presents with   Depression   Hyperlipidemia   Fatigue    Covid positive 3 weeks ago, states she is still feeling very fatigued    HPI Breanna Davidson presents for follow-up on depression, hyperlipidemia, and fatigue post covid. She is also going to [place] on Saturday and [place] in September. She was given ambien in the past to help her sleep with the flight and she was asking for this again. She doesn't use it during the whole trip or on a regular basis, just for the flight. She is also needing vaccinations for her trip to [place] from September 2 - September 15.    HYPERLIPIDEMIA  Hyperlipidemia status:  watching diet and taking omega 3 Satisfied with current treatment?  yes Side effects:  no Medication compliance: excellent compliance Past cholesterol meds: none and lovaza Supplements: fish oil Aspirin:  no The 10-year ASCVD risk score Breanna Davidson DC Jr., et al., 2013) is: 9.3%   Values used to calculate the score:     Age: 26 years     Sex: Female     Is Non-Hispanic African American: No     Diabetic: No     Tobacco smoker: No     Systolic Blood Pressure: 016 mmHg     Is BP treated: No     HDL Cholesterol: 84 mg/dL     Total Cholesterol: 302 mg/dL Chest pain:  no Coronary artery disease:  no Family history CAD:  no Family history early CAD:  no  DEPRESSION  Well controlled on celexa. She is tolerating the medication well and has no side effects. She feels her depression is well controlled at this time. She is experiencing fatigue, however it is related to covid 3 weeks ago.   Depression screen [facility] 2/9 04/13/2021 11/25/2020 09/08/2020 07/30/2020 01/29/2020  Decreased Interest 0 0 0 0 0  Down, Depressed, Hopeless 0 0 0 0 0  PHQ - 2 Score 0 0 0 0 0  Altered sleeping 0 0 0 0 1  Tired, decreased energy  3 0 - - 0  Change in appetite 1 0 0 0 0  Feeling bad or failure about yourself  0 0 0 0 0  Trouble concentrating 0 0 0 0 0  Moving slowly or fidgety/restless 0 0 0 0 0  Suicidal thoughts 0 0 0 0 0  PHQ-9 Score 4 0 0 0 1  Difficult doing work/chores Not difficult at all - Not difficult at all Not difficult at all Not difficult at all  Some recent data might be hidden       Past Medical History:  Diagnosis Date   Allergy    Chest pain    2017 ED VISIT FOR CHEST PAIN , R/O AS GASTRIC CAUSE    Depression    per NP notes, on cymbalta   Headache    per patient very rare sometimes when not well rested   Hyperlipidemia    Increased pressure in the eye    "THEY DID SURGERY ON MY EYES TO REDUCE THE PRESSURE , THEY MADE TWO HOLES IN MY EYES "   Osteopenia    Spinal stenosis    LUMBAR    Vertigo    IMPROVED BUT HAS OCCASIONAL SX    Past Surgical History:  Procedure Laterality  Date   APPENDECTOMY     BACK SURGERY  11/2019   CESAREAN SECTION     COLONOSCOPY WITH PROPOFOL N/A 08/26/2020   Procedure: COLONOSCOPY WITH PROPOFOL;  Surgeon: Breanna, Varnita B, MD;  Location: [facility] END[facility];  Service: Endoscopy;  Laterality: N/A;   DILATION AND CURETTAGE OF UTERUS     EYE SURGERY     "THEY DID SURGERY ON MY EYES TO REDUCE THE PRESSURE , THEY MADE TWO HOLES IN MY EYES "   REDUCTION MAMMAPLASTY  1988   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   Procedure: TOTAL HIP ARTHROPLASTY ANTERIOR APPROACH;  Surgeon: Breanna, Timothy D, MD;  Location: [facility];  Service: Orthopedics;  Laterality: Left;    Family History  Problem Relation Age of Onset   Breast cancer Mother 64       and again at 74   Lung disease Mother    Cancer Mother    Emphysema Mother    Breast cancer Maternal Grandmother    CAD Father    Hyperlipidemia Sister    Mental illness Daughter        bipolar   Cystic fibrosis Daughter    Breast cancer Maternal Aunt     Social History   Socioeconomic History   Marital status:  Married    Spouse name: Not on file   Number of children: Not on file   Years of education: college    Highest education level: Not on file  Occupational History   Occupation: retired   Tobacco Use   Smoking status: Never   Smokeless tobacco: Never  Vaping Use   Vaping Use: Never used  Substance and Sexual Activity   Alcohol use: Not Currently    Alcohol/week: 1.0 standard drink    Types: 1 Shots of liquor per week   Drug use: No   Sexual activity: Yes  Other Topics Concern   Not on file  Social History Narrative   Not on file   Social Determinants of Health   Financial Resource Strain: Low Risk    Difficulty of Paying Living Expenses: Not hard at all  Food Insecurity: No Food Insecurity   Worried About Running Out of Food in the Last Year: Never true   Ran Out of Food in the Last Year: Never true  Transportation Needs: No Transportation Needs   Lack of Transportation (Medical): No   Lack of Transportation (Non-Medical): No  Physical Activity: Insufficiently Active   Days of Exercise per Week: 3 days   Minutes of Exercise per Session: 30 min  Stress: No Stress Concern Present   Feeling of Stress : Not at all  Social Connections: Not on file  Intimate Partner Violence: Not on file    Outpatient Medications Prior to Visit  Medication Sig Dispense Refill   CALCIUM-MAGNESIUM PO Take 1 tablet by mouth daily.     Cholecalciferol (VITAMIN Davidson) 50 MCG (2000 UT) CAPS Take 2,000 Units by mouth daily.      citalopram (CELEXA) 10 MG tablet Take 1 tablet (10 mg total) by mouth daily. 90 tablet 4   Coenzyme Q10-Red Yeast Rice (CO Q-10 PLUS RED YEAST RICE PO) Take 1 capsule by mouth daily.     Dorzolamide HCl-Timolol Mal PF 2-0.5 % SOLN Apply 1 drop to eye daily.     fexofenadine (ALLEGRA ALLERGY) 180 MG tablet Take 1 tablet (180 mg total) by mouth daily. 14 tablet 0   fluticasone (FLONASE) 50 MCG/ACT nasal spray Place 2 sprays into both   nostrils daily. 16 g 6   Omega-3 Fatty Acids  (FISH OIL) 1000 MG CAPS Take 1,000 mg by mouth daily.      tiZANidine (ZANAFLEX) 4 MG tablet Take by mouth.     nystatin cream (MYCOSTATIN) Apply 1 application topically 2 (two) times daily. (Patient not taking: No sig reported) 30 g 1   No facility-administered medications prior to visit.    Allergies  Allergen Reactions   Combigan [Brimonidine Tartrate-Timolol] Other (See Comments)    Irritated eyes, redness and itching   Prednisone Other (See Comments)    Altered Mental Status    ROS Review of Systems  Constitutional:  Positive for fatigue.  HENT:  Positive for ear pain (congestion in left ear). Negative for sore throat.   Eyes: Negative.   Respiratory: Negative.    Cardiovascular: Negative.   Gastrointestinal:  Positive for constipation (chronic, stable). Negative for abdominal pain.  Genitourinary: Negative.   Neurological: Negative.      Objective:    Physical Exam Vitals and nursing note reviewed.  Constitutional:      General: She is not in acute distress.    Appearance: Normal appearance.  HENT:     Head: Normocephalic and atraumatic.  Eyes:     Conjunctiva/sclera: Conjunctivae normal.  Cardiovascular:     Rate and Rhythm: Normal rate and regular rhythm.     Pulses: Normal pulses.     Heart sounds: Normal heart sounds.  Pulmonary:     Effort: Pulmonary effort is normal.     Breath sounds: Normal breath sounds.  Abdominal:     Palpations: Abdomen is soft.     Tenderness: There is no abdominal tenderness.  Musculoskeletal:     Cervical back: Normal range of motion.  Skin:    General: Skin is warm and dry.     Comments: Scabbed abrasion to left leg. No redness/signs of infection  Neurological:     General: No focal deficit present.     Mental Status: She is alert and oriented to person, place, and time.  Psychiatric:        Mood and Affect: Mood normal.        Behavior: Behavior normal.        Thought Content: Thought content normal.        Judgment:  Judgment normal.    BP 124/76   Pulse (!) 59   Temp 98.4 F (36.9 C) (Oral)   Ht 5' 2.1" (1.577 m)   Wt 172 lb 3.2 oz (78.1 kg)   LMP  (LMP Unknown)   SpO2 98%   BMI 31.39 kg/m  Wt Readings from Last 3 Encounters:  04/13/21 172 lb 3.2 oz (78.1 kg)  11/25/20 170 lb 9.6 oz (77.4 kg)  09/22/20 172 lb (78 kg)     Health Maintenance Due  Topic Date Due   COVID-19 Vaccine (4 - Booster for Pfizer series) 12/14/2020    There are no preventive care reminders to display for this patient.  Lab Results  Component Value Date   TSH 2.270 09/22/2020   Lab Results  Component Value Date   WBC 6.3 09/22/2020   HGB 13.1 09/22/2020   HCT 38.8 09/22/2020   MCV 90 09/22/2020   PLT 301 09/22/2020   Lab Results  Component Value Date   NA 139 09/22/2020   K 4.0 09/22/2020   CO2 24 09/22/2020   GLUCOSE 91 09/22/2020   BUN 17 09/22/2020   CREATININE 0.79 09/22/2020   BILITOT 0.5   09/22/2020   ALKPHOS 76 09/22/2020   AST 24 09/22/2020   ALT 32 09/22/2020   PROT 6.5 09/22/2020   ALBUMIN 4.3 09/22/2020   CALCIUM 9.3 09/22/2020   ANIONGAP 10 12/10/2019   Lab Results  Component Value Date   CHOL 302 (H) 09/22/2020   Lab Results  Component Value Date   HDL 84 09/22/2020   Lab Results  Component Value Date   LDLCALC 196 (H) 09/22/2020   Lab Results  Component Value Date   TRIG 125 09/22/2020   Lab Results  Component Value Date   CHOLHDL 3.2 08/01/2018   Lab Results  Component Value Date   HGBA1C 5.4 05/31/2019      Assessment & Plan:   Problem List Items Addressed This Visit       Cardiovascular and Mediastinum   Aortic atherosclerosis (HCC)    Chronic, noted on lumbar imaging. She is hesitant to start statins and is currently focusing on diet, exercise, and taking an omega 3 supplement. Will check lipid panel, CMP, and CBC today. Follow up in 6 months.        Relevant Orders   Comp Met (CMET)   CBC with Differential   Lipid Panel w/o Chol/HDL Ratio      Other   Hypercholesteremia - Primary    Chronic, aortic atherosclerosis noted on lumbar imaging. She is hesitant to start statins and is currently focusing on diet, exercise, and taking an omega 3 supplement. Will check lipid panel today.        Relevant Orders   Comp Met (CMET)   CBC with Differential   Lipid Panel w/o Chol/HDL Ratio   Depression    Chronic, stable on celexa. Continue current regimen. PHQ 9 today is a 4, however her fatigue is most likely related to covid-19. Follow up in 6 months.        Travel advice encounter    Traveling to [place] and [place]. Will give 10 tablets of ambien for travel. PDMP reviewed. Risks/benefits reviewed. Will vaccinate hepatitis A/Davidson and treat with malonone as recommended by the CDC. Will schedule a nurse visit for 1 month for second dose of hepatitis Davidson vaccine, and then in 6 months for final hepatitis A and Davidson vaccines.        Relevant Orders   Hepatitis Davidson vaccine adult IM   Hepatitis A vaccine adult IM   Hepatitis Davidson vaccine adult IM   History of COVID-19    Had covid-19 3 weeks ago. She has recovered well with the exception of lingering fatigue. Discussed that fatigue can last for several months after covid infection. She can slowly increase activity and take rest breaks as needed. She still has some ear congestion to her left ear with no infection noted. She can take zyrtec or claritin to help with symptoms.        Other Visit Diagnoses     Abrasion       Healing well, no signs of infection. Will update Td today   Relevant Orders   Td vaccine greater than or equal to 7yo preservative free IM (Completed)   Need for hepatitis A and Davidson vaccination       Vaccine series started today for hepatitis A and Davidson   Relevant Orders   Hepatitis A vaccine adult IM (Completed)   Hepatitis Davidson vaccine adult IM (Completed)   Hepatitis Davidson vaccine adult IM   Hepatitis A vaccine adult IM   Hepatitis Davidson vaccine adult IM  Meds ordered this  encounter  Medications   zolpidem (AMBIEN) 5 MG tablet    Sig: Take 1 tablet (5 mg total) by mouth at bedtime as needed for sleep.    Dispense:  10 tablet    Refill:  0   atovaquone-proguanil (MALARONE) 250-100 MG TABS tablet    Sig: Take 1 tablet by mouth daily.    Dispense:  30 tablet    Refill:  0    Follow-up: Return in about 6 months (around 10/13/2021) for hld, osteopenia--needs nurse visit for vaccine in 1 month .    Lauren A McElwee, NP 

## 2021-04-13 NOTE — Assessment & Plan Note (Addendum)
Chronic, noted on lumbar imaging. She is hesitant to start statins and is currently focusing on diet, exercise, and taking an omega 3 supplement. Will check lipid panel, CMP, and CBC today. Follow up in 6 months.

## 2021-04-13 NOTE — Telephone Encounter (Signed)
Copied from [place] [id number]. Topic: General - Other >> Apr 13, 2021  1:49 PM Tessa Lerner A wrote: Reason for CRM: Patient has requested that their prescription for malaria medication be submitted to  [place], [place] [address] [place] [address] Phone: [phone] Fax: [phone]  Please contact to further advise if needed  Fyi provider was made aware provider stated she would send in medication.

## 2021-04-13 NOTE — Patient Instructions (Addendum)
You can take claritin or zyrtec for congestion [facility] on [address] in [place] near [place] ahead of time for yellow fever and typhoid Come back in 1 month for second hepatitis B vaccine Then 6 months for routine visit and last hepatitis A & B vaccines Start antimalarial tablet 2 days before you leave, the whole time you are there, and then 7 days after you are back

## 2021-04-14 LAB — COMPREHENSIVE METABOLIC PANEL
ALT: 18 IU/L (ref 0–32)
AST: 17 IU/L (ref 0–40)
Albumin/Globulin Ratio: 2 (ref 1.2–2.2)
Albumin: 4.1 g/dL (ref 3.8–4.8)
Alkaline Phosphatase: 60 IU/L (ref 44–121)
BUN/Creatinine Ratio: 23 (ref 12–28)
BUN: 16 mg/dL (ref 8–27)
Bilirubin Total: 0.3 mg/dL (ref 0.0–1.2)
CO2: 24 mmol/L (ref 20–29)
Calcium: 9.3 mg/dL (ref 8.7–10.3)
Chloride: 104 mmol/L (ref 96–106)
Creatinine, Ser: 0.69 mg/dL (ref 0.57–1.00)
Globulin, Total: 2.1 g/dL (ref 1.5–4.5)
Glucose: 83 mg/dL (ref 65–99)
Potassium: 4.4 mmol/L (ref 3.5–5.2)
Sodium: 141 mmol/L (ref 134–144)
Total Protein: 6.2 g/dL (ref 6.0–8.5)
eGFR: 93 mL/min/{1.73_m2} (ref >59)

## 2021-04-14 LAB — CBC WITH DIFFERENTIAL/PLATELET
Basophils Absolute: 0 10*3/uL (ref 0.0–0.2)
Basos: 1 %
EOS (ABSOLUTE): 0.3 10*3/uL (ref 0.0–0.4)
Eos: 4 %
Hematocrit: 37.8 % (ref 34.0–46.6)
Hemoglobin: 12.4 g/dL (ref 11.1–15.9)
Immature Grans (Abs): 0 10*3/uL (ref 0.0–0.1)
Immature Granulocytes: 0 %
Lymphocytes Absolute: 1.8 10*3/uL (ref 0.7–3.1)
Lymphs: 23 %
MCH: 29.5 pg (ref 26.6–33.0)
MCHC: 32.8 g/dL (ref 31.5–35.7)
MCV: 90 fL (ref 79–97)
Monocytes Absolute: 0.7 10*3/uL (ref 0.1–0.9)
Monocytes: 9 %
Neutrophils Absolute: 5.1 10*3/uL (ref 1.4–7.0)
Neutrophils: 63 %
Platelets: 277 10*3/uL (ref 150–450)
RBC: 4.2 x10E6/uL (ref 3.77–5.28)
RDW: 13.1 % (ref 11.7–15.4)
WBC: 8 10*3/uL (ref 3.4–10.8)

## 2021-04-14 LAB — LIPID PANEL W/O CHOL/HDL RATIO
Cholesterol, Total: 239 mg/dL — ABNORMAL HIGH (ref 100–199)
HDL: 72 mg/dL (ref >39)
LDL Chol Calc (NIH): 150 mg/dL — ABNORMAL HIGH (ref 0–99)
Triglycerides: 96 mg/dL (ref 0–149)
VLDL Cholesterol Cal: 17 mg/dL (ref 5–40)

## 2021-04-23 ENCOUNTER — Ambulatory Visit: Payer: Medicare Other | Admitting: Nurse Practitioner

## 2021-05-13 ENCOUNTER — Ambulatory Visit (INDEPENDENT_AMBULATORY_CARE_PROVIDER_SITE_OTHER): Payer: Medicare Other

## 2021-05-13 ENCOUNTER — Other Ambulatory Visit: Payer: Self-pay

## 2021-05-13 DIAGNOSIS — Z7184 Encounter for health counseling related to travel: Secondary | ICD-10-CM

## 2021-05-13 DIAGNOSIS — Z23 Encounter for immunization: Secondary | ICD-10-CM

## 2021-06-05 ENCOUNTER — Encounter: Payer: Self-pay | Admitting: Nurse Practitioner

## 2021-06-05 ENCOUNTER — Telehealth (INDEPENDENT_AMBULATORY_CARE_PROVIDER_SITE_OTHER): Payer: Medicare Other | Admitting: Nurse Practitioner

## 2021-06-05 VITALS — Temp 98.5°F

## 2021-06-05 DIAGNOSIS — R059 Cough, unspecified: Secondary | ICD-10-CM

## 2021-06-05 MED ORDER — HYDROCOD POLST-CPM POLST ER 10-8 MG/5ML PO SUER: 5.0000 mL | Freq: Every evening | ORAL | 0 refills | Status: DC | PRN

## 2021-06-05 MED ORDER — BENZONATATE 100 MG PO CAPS: 100.0000 mg | ORAL_CAPSULE | Freq: Two times a day (BID) | ORAL | 0 refills | Status: DC | PRN

## 2021-06-05 MED ORDER — OMEPRAZOLE 20 MG PO CPDR: 20.0000 mg | DELAYED_RELEASE_CAPSULE | Freq: Every day | ORAL | 0 refills | Status: DC

## 2021-06-05 NOTE — Progress Notes (Signed)
Acute Office Visit  Subjective:    Patient ID: Breanna Davidson, female    DOB: [birth year], 70 y.o.   MRN: [id number]  Chief Complaint  Patient presents with   Cough    Especially more at night with a lot of white mucus coming up and  upper chest congestion. Patient states happens mostly when she lays down at night.  Patient had Covid in May. Patient states that she is getting dizzy and get headaches.  when she gets up or if she lays down to fast. Has a h/o of vertigo   Medication    Patient has stopped taking Claritin.     HPI Patient is in today for cough that has worsened over the last week. She states that she had covid-19 at the end of May and has had a cough and fatigue since then. She says that her cough is worse at night when she is laying down. She also endorses some dizziness and shortness of breath after a coughing spell. She has taken flonase which has helped some. She also endorses sneezing frequently. Denies fevers, swelling in her legs, night sweats, and coughing up blood.    Past Medical History:  Diagnosis Date   Allergy    Chest pain    2017 ED VISIT FOR CHEST PAIN , R/O AS GASTRIC CAUSE    Depression    per NP notes, on cymbalta   Headache    per patient very rare sometimes when not well rested   Hyperlipidemia    Increased pressure in the eye    "THEY DID SURGERY ON MY EYES TO REDUCE THE PRESSURE , THEY MADE TWO HOLES IN MY EYES "   Osteopenia    Spinal stenosis    LUMBAR    Vertigo    IMPROVED BUT HAS OCCASIONAL SX    Past Surgical History:  Procedure Laterality Date   APPENDECTOMY     BACK SURGERY  11/2019   CESAREAN SECTION     COLONOSCOPY WITH PROPOFOL N/A 08/26/2020   Procedure: COLONOSCOPY WITH PROPOFOL;  Surgeon: Virgel Manifold, MD;  Location: [facility];  Service: Endoscopy;  Laterality: N/A;   DILATION AND CURETTAGE OF UTERUS     EYE SURGERY     "THEY DID SURGERY ON MY EYES TO REDUCE THE PRESSURE , THEY MADE TWO HOLES IN MY EYES "    REDUCTION MAMMAPLASTY  1988   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   Procedure: TOTAL HIP ARTHROPLASTY ANTERIOR APPROACH;  Surgeon: Renette Butters, MD;  Location: [facility];  Service: Orthopedics;  Laterality: Left;    Family History  Problem Relation Age of Onset   Breast cancer Mother 60       and again at 30   Lung disease Mother    Cancer Mother    Emphysema Mother    Breast cancer Maternal Grandmother    CAD Father    Hyperlipidemia Sister    Mental illness Daughter        bipolar   Cystic fibrosis Daughter    Breast cancer Maternal Aunt     Social History   Socioeconomic History   Marital status: Married    Spouse name: Not on file   Number of children: Not on file   Years of education: college    Highest education level: Not on file  Occupational History   Occupation: retired   Tobacco Use   Smoking status: Never   Smokeless tobacco: Never  Media planner  Vaping Use: Never used  Substance and Sexual Activity   Alcohol use: Not Currently    Alcohol/week: 1.0 standard drink    Types: 1 Shots of liquor per week   Drug use: No   Sexual activity: Yes  Other Topics Concern   Not on file  Social History Narrative   Not on file   Social Determinants of Health   Financial Resource Strain: Low Risk    Difficulty of Paying Living Expenses: Not hard at all  Food Insecurity: No Food Insecurity   Worried About Charity fundraiser in the Last Year: Never true   [place] in the Last Year: Never true  Transportation Needs: No Transportation Needs   Lack of Transportation (Medical): No   Lack of Transportation (Non-Medical): No  Physical Activity: Insufficiently Active   Days of Exercise per Week: 3 days   Minutes of Exercise per Session: 30 min  Stress: No Stress Concern Present   Feeling of Stress : Not at all  Social Connections: Not on file  Intimate Partner Violence: Not on file    Outpatient Medications Prior to Visit  Medication Sig Dispense  Refill   atovaquone-proguanil (MALARONE) 250-100 MG TABS tablet Take 1 tablet by mouth daily. 30 tablet 0   CALCIUM-MAGNESIUM PO Take 1 tablet by mouth daily.     Cholecalciferol (VITAMIN D) 50 MCG (2000 UT) CAPS Take 2,000 Units by mouth daily.      Coenzyme Q10-Red Yeast Rice (CO Q-10 PLUS RED YEAST RICE PO) Take 1 capsule by mouth daily.     Dorzolamide HCl-Timolol Mal PF 2-0.5 % SOLN Apply 1 drop to eye daily.     fluticasone (FLONASE) 50 MCG/ACT nasal spray Place 2 sprays into both nostrils daily. 16 g 6   nystatin cream (MYCOSTATIN) Apply 1 application topically 2 (two) times daily. 30 g 1   Omega-3 Fatty Acids (FISH OIL) 1000 MG CAPS Take 1,000 mg by mouth daily.      citalopram (CELEXA) 10 MG tablet Take 1 tablet (10 mg total) by mouth daily. (Patient not taking: Reported on 06/05/2021) 90 tablet 4   fexofenadine (ALLEGRA ALLERGY) 180 MG tablet Take 1 tablet (180 mg total) by mouth daily. (Patient not taking: Reported on 06/05/2021) 14 tablet 0   tiZANidine (ZANAFLEX) 4 MG tablet Take by mouth. (Patient not taking: Reported on 06/05/2021)     zolpidem (AMBIEN) 5 MG tablet Take 1 tablet (5 mg total) by mouth at bedtime as needed for sleep. (Patient not taking: Reported on 06/05/2021) 10 tablet 0   No facility-administered medications prior to visit.    Allergies  Allergen Reactions   Combigan [Brimonidine Tartrate-Timolol] Other (See Comments)    Irritated eyes, redness and itching   Prednisone Other (See Comments)    Altered Mental Status    Review of Systems  Constitutional:  Positive for fatigue and fever. Negative for chills.  HENT:  Positive for sneezing. Negative for congestion, postnasal drip and rhinorrhea.   Respiratory:  Positive for cough and shortness of breath (with coughing spells).   Cardiovascular: Negative.   Gastrointestinal: Negative.   Genitourinary: Negative.   Musculoskeletal: Negative.   Skin: Negative.   Neurological:  Positive for dizziness (with  coughing).      Objective:    Physical Exam Vitals and nursing note reviewed.  Constitutional:      General: She is not in acute distress.    Appearance: Normal appearance.  HENT:     Head:  Normocephalic.  Eyes:     Conjunctiva/sclera: Conjunctivae normal.  Pulmonary:     Effort: Pulmonary effort is normal.     Comments: Able to talk in complete sentences. Clearing throat frequently.  Neurological:     Mental Status: She is alert and oriented to person, place, and time.  Psychiatric:        Mood and Affect: Mood normal.        Behavior: Behavior normal.        Thought Content: Thought content normal.        Judgment: Judgment normal.    Temp 98.5 F (36.9 C)   LMP  (LMP Unknown)  Wt Readings from Last 3 Encounters:  04/13/21 172 lb 3.2 oz (78.1 kg)  11/25/20 170 lb 9.6 oz (77.4 kg)  09/22/20 172 lb (78 kg)    Health Maintenance Due  Topic Date Due   INFLUENZA VACCINE  05/25/2021    There are no preventive care reminders to display for this patient.   Lab Results  Component Value Date   TSH 2.270 09/22/2020   Lab Results  Component Value Date   WBC 8.0 04/13/2021   HGB 12.4 04/13/2021   HCT 37.8 04/13/2021   MCV 90 04/13/2021   PLT 277 04/13/2021   Lab Results  Component Value Date   NA 141 04/13/2021   K 4.4 04/13/2021   CO2 24 04/13/2021   GLUCOSE 83 04/13/2021   BUN 16 04/13/2021   CREATININE 0.69 04/13/2021   BILITOT 0.3 04/13/2021   ALKPHOS 60 04/13/2021   AST 17 04/13/2021   ALT 18 04/13/2021   PROT 6.2 04/13/2021   ALBUMIN 4.1 04/13/2021   CALCIUM 9.3 04/13/2021   ANIONGAP 10 12/10/2019   EGFR 93 04/13/2021   Lab Results  Component Value Date   CHOL 239 (H) 04/13/2021   Lab Results  Component Value Date   HDL 72 04/13/2021   Lab Results  Component Value Date   LDLCALC 150 (H) 04/13/2021   Lab Results  Component Value Date   TRIG 96 04/13/2021   Lab Results  Component Value Date   CHOLHDL 3.2 08/01/2018   Lab Results   Component Value Date   HGBA1C 5.4 05/31/2019       Assessment & Plan:   Problem List Items Addressed This Visit       Other   Cough - Primary    Has had cough for 2.5 months since covid-19 in May, however cough has worsened over the last week and is worse at night. Will treat with claritin daily, flonase daily, tessalon perles and tussionex prn. Encouraged fluids. With cough worse while laying will try prilosec daily for 2 weeks. Follow up in 1 week if not better.         Meds ordered this encounter  Medications   benzonatate (TESSALON) 100 MG capsule    Sig: Take 1 capsule (100 mg total) by mouth 2 (two) times daily as needed for cough.    Dispense:  20 capsule    Refill:  0   omeprazole (PRILOSEC) 20 MG capsule    Sig: Take 1 capsule (20 mg total) by mouth daily.    Dispense:  14 capsule    Refill:  0   chlorpheniramine-HYDROcodone (TUSSIONEX PENNKINETIC ER) 10-8 MG/5ML SUER    Sig: Take 5 mLs by mouth at bedtime as needed for cough.    Dispense:  115 mL    Refill:  0     Ami Thornsberry  Avelina Laine, NP

## 2021-06-05 NOTE — Assessment & Plan Note (Signed)
Has had cough for 2.5 months since covid-19 in May, however cough has worsened over the last week and is worse at night. Will treat with claritin daily, flonase daily, tessalon perles and tussionex prn. Encouraged fluids. With cough worse while laying will try prilosec daily for 2 weeks. Follow up in 1 week if not better.

## 2021-07-14 ENCOUNTER — Telehealth: Payer: Self-pay

## 2021-07-14 NOTE — Telephone Encounter (Signed)
Copied from [place] [id number]. Topic: General - Other >> Jul 13, 2021  2:52 PM Tessa Lerner A wrote: Reason for CRM: The patient would like to speak with a member of clinical staff when possible  The patient has recently returned from traveling and shares that they've continued to experience discomfort related to a cough   The patient shares that theyre already taking medication to help with the cough but have seen little to no relief  The patient would like to know how many benzonatate (TESSALON) 100 MG capsule [[id number]]  capsules they're able to take to help with symptoms  The patient is also taking fluticasone (FLONASE) 50 MCG/ACT nasal spray [[id number]]  and wants to make sure they won't negatively interact with one another  Please contact further when possible

## 2021-07-31 NOTE — Progress Notes (Signed)
Established Patient Office Visit  Subjective:  Patient ID: Breanna Davidson, female    DOB: 27-Oct-1950  Age: 70 y.o. MRN: [id number]  CC:  Chief Complaint  Patient presents with   Cough    Patient states that she has an appt with ENT on 11/29    HPI Breanna Davidson presents for ongoing cough since having covid-19 at the end of May and beginning of June 2022. She had a chest x-ray on March 30, 2021 that was negative for active cardiopulmonary disease.   COUGH  Duration: months Circumstances of initial development of cough: URI - covid-19 at end of May 2022 Cough severity: severe Cough description: productive Aggravating factors:  worse at night Alleviating factors:  flonase Status:  fluctuating Treatments attempted: tessalon, benadryl, flonase, pseudophed, tussionex Wheezing: no Shortness of breath: yes, with exertion Chest pain: no Chest tightness:no Nasal congestion: no Runny nose: no Postnasal drip: yes Frequent throat clearing or swallowing: no Hemoptysis: no Fevers: no Night sweats: no Weight loss: no Heartburn: no Recent foreign travel: yes Tuberculosis contacts: no   Past Medical History:  Diagnosis Date   Allergy    Chest pain    2017 ED VISIT FOR CHEST PAIN , R/O AS GASTRIC CAUSE    Depression    per NP notes, on cymbalta   Headache    per patient very rare sometimes when not well rested   Hyperlipidemia    Increased pressure in the eye    "THEY DID SURGERY ON MY EYES TO REDUCE THE PRESSURE , THEY MADE TWO HOLES IN MY EYES "   Osteopenia    Spinal stenosis    LUMBAR    Vertigo    IMPROVED BUT HAS OCCASIONAL SX    Past Surgical History:  Procedure Laterality Date   APPENDECTOMY     BACK SURGERY  11/2019   CESAREAN SECTION     COLONOSCOPY WITH PROPOFOL N/A 08/26/2020   Procedure: COLONOSCOPY WITH PROPOFOL;  Surgeon: Virgel Manifold, MD;  Location: [facility];  Service: Endoscopy;  Laterality: N/A;   DILATION AND CURETTAGE OF UTERUS      EYE SURGERY     "THEY DID SURGERY ON MY EYES TO REDUCE THE PRESSURE , THEY MADE TWO HOLES IN MY EYES "   REDUCTION MAMMAPLASTY  1988   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   Procedure: TOTAL HIP ARTHROPLASTY ANTERIOR APPROACH;  Surgeon: Renette Butters, MD;  Location: [facility];  Service: Orthopedics;  Laterality: Left;    Family History  Problem Relation Age of Onset   Breast cancer Mother 35       and again at 14   Lung disease Mother    Cancer Mother    Emphysema Mother    Breast cancer Maternal Grandmother    CAD Father    Hyperlipidemia Sister    Mental illness Daughter        bipolar   Cystic fibrosis Daughter    Breast cancer Maternal Aunt     Social History   Socioeconomic History   Marital status: Married    Spouse name: Not on file   Number of children: Not on file   Years of education: college    Highest education level: Not on file  Occupational History   Occupation: retired   Tobacco Use   Smoking status: Never   Smokeless tobacco: Never  Vaping Use   Vaping Use: Never used  Substance and Sexual Activity   Alcohol use: Not Currently  Alcohol/week: 1.0 standard drink    Types: 1 Shots of liquor per week   Drug use: No   Sexual activity: Yes  Other Topics Concern   Not on file  Social History Narrative   Not on file   Social Determinants of Health   Financial Resource Strain: Low Risk    Difficulty of Paying Living Expenses: Not hard at all  Food Insecurity: No Food Insecurity   Worried About Charity fundraiser in the Last Year: Never true   [place] in the Last Year: Never true  Transportation Needs: No Transportation Needs   Lack of Transportation (Medical): No   Lack of Transportation (Non-Medical): No  Physical Activity: Insufficiently Active   Days of Exercise per Week: 3 days   Minutes of Exercise per Session: 30 min  Stress: No Stress Concern Present   Feeling of Stress : Not at all  Social Connections: Not on file   Intimate Partner Violence: Not on file    Outpatient Medications Prior to Visit  Medication Sig Dispense Refill   benzonatate (TESSALON) 100 MG capsule Take 1 capsule (100 mg total) by mouth 2 (two) times daily as needed for cough. 20 capsule 0   CALCIUM-MAGNESIUM PO Take 1 tablet by mouth daily.     Cholecalciferol (VITAMIN D) 50 MCG (2000 UT) CAPS Take 2,000 Units by mouth daily.      Coenzyme Q10-Red Yeast Rice (CO Q-10 PLUS RED YEAST RICE PO) Take 1 capsule by mouth daily.     Dorzolamide HCl-Timolol Mal PF 2-0.5 % SOLN Apply 1 drop to eye daily.     fluticasone (FLONASE) 50 MCG/ACT nasal spray Place 2 sprays into both nostrils daily. 16 g 6   nystatin cream (MYCOSTATIN) Apply 1 application topically 2 (two) times daily. 30 g 1   Omega-3 Fatty Acids (FISH OIL) 1000 MG CAPS Take 1,000 mg by mouth daily.      atovaquone-proguanil (MALARONE) 250-100 MG TABS tablet Take 1 tablet by mouth daily. 30 tablet 0   chlorpheniramine-HYDROcodone (TUSSIONEX PENNKINETIC ER) 10-8 MG/5ML SUER Take 5 mLs by mouth at bedtime as needed for cough. 115 mL 0   citalopram (CELEXA) 10 MG tablet Take 1 tablet (10 mg total) by mouth daily. (Patient not taking: Reported on 06/05/2021) 90 tablet 4   fexofenadine (ALLEGRA ALLERGY) 180 MG tablet Take 1 tablet (180 mg total) by mouth daily. (Patient not taking: Reported on 06/05/2021) 14 tablet 0   omeprazole (PRILOSEC) 20 MG capsule Take 1 capsule (20 mg total) by mouth daily. 14 capsule 0   tiZANidine (ZANAFLEX) 4 MG tablet Take by mouth. (Patient not taking: Reported on 06/05/2021)     zolpidem (AMBIEN) 5 MG tablet Take 1 tablet (5 mg total) by mouth at bedtime as needed for sleep. (Patient not taking: Reported on 06/05/2021) 10 tablet 0   No facility-administered medications prior to visit.    Allergies  Allergen Reactions   Combigan [Brimonidine Tartrate-Timolol] Other (See Comments)    Irritated eyes, redness and itching   Prednisone Other (See Comments)     Altered Mental Status    ROS Review of Systems  Constitutional:  Positive for fatigue. Negative for fever.  HENT: Negative.    Respiratory:  Positive for cough. Negative for shortness of breath.   Cardiovascular: Negative.   Gastrointestinal: Negative.   Genitourinary: Negative.   Skin: Negative.   Neurological: Negative.      Objective:    Physical Exam Vitals and nursing  note reviewed.  Constitutional:      General: She is not in acute distress.    Appearance: Normal appearance.  HENT:     Head: Normocephalic and atraumatic.     Right Ear: Tympanic membrane, ear canal and external ear normal.     Left Ear: Tympanic membrane, ear canal and external ear normal.  Eyes:     Conjunctiva/sclera: Conjunctivae normal.  Cardiovascular:     Rate and Rhythm: Normal rate and regular rhythm.     Pulses: Normal pulses.     Heart sounds: Normal heart sounds.  Pulmonary:     Effort: Pulmonary effort is normal.     Breath sounds: Normal breath sounds.  Musculoskeletal:     Cervical back: Normal range of motion.  Skin:    General: Skin is warm and dry.  Neurological:     General: No focal deficit present.     Mental Status: She is alert and oriented to person, place, and time.  Psychiatric:        Mood and Affect: Mood normal.        Behavior: Behavior normal.        Thought Content: Thought content normal.        Judgment: Judgment normal.    BP 126/75   Pulse 98   Temp 99.3 F (37.4 C) (Oral)   Ht 5' 2.09" (1.577 m)   Wt 173 lb (78.5 kg)   LMP  (LMP Unknown)   SpO2 98%   BMI 31.55 kg/m  Wt Readings from Last 3 Encounters:  08/03/21 173 lb (78.5 kg)  04/13/21 172 lb 3.2 oz (78.1 kg)  11/25/20 170 lb 9.6 oz (77.4 kg)     Health Maintenance Due  Topic Date Due   Zoster Vaccines- Shingrix (1 of 2) Never done   COVID-19 Vaccine (4 - Booster for Pfizer series) 12/14/2020    There are no preventive care reminders to display for this patient.  Lab Results   Component Value Date   TSH 2.270 09/22/2020   Lab Results  Component Value Date   WBC 8.0 04/13/2021   HGB 12.4 04/13/2021   HCT 37.8 04/13/2021   MCV 90 04/13/2021   PLT 277 04/13/2021   Lab Results  Component Value Date   NA 141 04/13/2021   K 4.4 04/13/2021   CO2 24 04/13/2021   GLUCOSE 83 04/13/2021   BUN 16 04/13/2021   CREATININE 0.69 04/13/2021   BILITOT 0.3 04/13/2021   ALKPHOS 60 04/13/2021   AST 17 04/13/2021   ALT 18 04/13/2021   PROT 6.2 04/13/2021   ALBUMIN 4.1 04/13/2021   CALCIUM 9.3 04/13/2021   ANIONGAP 10 12/10/2019   EGFR 93 04/13/2021   Lab Results  Component Value Date   CHOL 239 (H) 04/13/2021   Lab Results  Component Value Date   HDL 72 04/13/2021   Lab Results  Component Value Date   LDLCALC 150 (H) 04/13/2021   Lab Results  Component Value Date   TRIG 96 04/13/2021   Lab Results  Component Value Date   CHOLHDL 3.2 08/01/2018   Lab Results  Component Value Date   HGBA1C 5.4 05/31/2019      Assessment & Plan:   Problem List Items Addressed This Visit       Other   Cough - Primary    Chronic, ongoing since covid-19 infection in May. Spirometry in office today normal. Chest x-ray in June was negative for acute disease. Will repeat chest  x-ray today with ongoing symptoms. Tessalon, benadryl, and mucinex do not help. Tussionex has helped her get sleep at night and helped with cough. Will send in a refill for this. PDMP reviewed. Will also place referral to pulmonology with ongoing symptoms and await evaluation and recommendations. She has an appointment with PCP in December, keep this appointment or call sooner for concerns.       Relevant Orders   Spirometry with graph (Completed)   DG Chest 2 View   Ambulatory referral to Pulmonology   Other Visit Diagnoses     Need for influenza vaccination       Flu vaccine given today   Relevant Orders   Flu Vaccine QUAD High Dose(Fluad) (Completed)       Meds ordered this  encounter  Medications   chlorpheniramine-HYDROcodone (TUSSIONEX PENNKINETIC ER) 10-8 MG/5ML SUER    Sig: Take 5 mLs by mouth at bedtime as needed for cough.    Dispense:  70 mL    Refill:  0     Follow-up: Return if symptoms worsen or fail to improve.    Charyl Dancer, NP

## 2021-08-03 ENCOUNTER — Ambulatory Visit
Admission: RE | Admit: 2021-08-03 | Discharge: 2021-08-03 | Disposition: A | Discharge status: Home / Self Care | Payer: Medicare Other | Attending: Nurse Practitioner | Admitting: Nurse Practitioner

## 2021-08-03 ENCOUNTER — Ambulatory Visit (INDEPENDENT_AMBULATORY_CARE_PROVIDER_SITE_OTHER): Payer: Medicare Other | Admitting: Nurse Practitioner

## 2021-08-03 ENCOUNTER — Encounter: Payer: Self-pay | Admitting: Nurse Practitioner

## 2021-08-03 ENCOUNTER — Other Ambulatory Visit: Payer: Self-pay

## 2021-08-03 ENCOUNTER — Ambulatory Visit
Admission: RE | Admit: 2021-08-03 | Discharge: 2021-08-03 | Disposition: A | Discharge status: Home / Self Care | Payer: Medicare Other | Source: Ambulatory Visit | Attending: Nurse Practitioner | Admitting: Nurse Practitioner

## 2021-08-03 VITALS — BP 126/75 | HR 98 | Temp 99.3°F | Ht 62.09 in | Wt 173.0 lb

## 2021-08-03 DIAGNOSIS — Z23 Encounter for immunization: Secondary | ICD-10-CM | POA: Diagnosis not present

## 2021-08-03 DIAGNOSIS — R053 Chronic cough: Secondary | ICD-10-CM

## 2021-08-03 DIAGNOSIS — R0602 Shortness of breath: Secondary | ICD-10-CM | POA: Diagnosis not present

## 2021-08-03 DIAGNOSIS — R059 Cough, unspecified: Secondary | ICD-10-CM | POA: Diagnosis not present

## 2021-08-03 LAB — SPIROMETRY WITH GRAPH

## 2021-08-03 MED ORDER — HYDROCOD POLST-CPM POLST ER 10-8 MG/5ML PO SUER: 5.0000 mL | Freq: Every evening | ORAL | 0 refills | Status: DC | PRN

## 2021-08-03 NOTE — Assessment & Plan Note (Addendum)
Chronic, ongoing since covid-19 infection in May. Spirometry in office today normal. Chest x-ray in June was negative for acute disease. Will repeat chest x-ray today with ongoing symptoms. Tessalon, benadryl, and mucinex do not help. Tussionex has helped her get sleep at night and helped with cough. Will send in a refill for this. PDMP reviewed. Will also place referral to pulmonology with ongoing symptoms and await evaluation and recommendations. She has an appointment with PCP in December, keep this appointment or call sooner for concerns.

## 2021-08-24 DIAGNOSIS — H40053 Ocular hypertension, bilateral: Secondary | ICD-10-CM | POA: Diagnosis not present

## 2021-08-27 ENCOUNTER — Ambulatory Visit: Payer: Medicare Other | Admitting: Pulmonary Disease

## 2021-08-27 ENCOUNTER — Encounter: Payer: Self-pay | Admitting: Pulmonary Disease

## 2021-08-27 ENCOUNTER — Other Ambulatory Visit: Payer: Self-pay

## 2021-08-27 VITALS — BP 130/70 | HR 72 | Temp 98.4°F | Ht 62.0 in | Wt 174.6 lb

## 2021-08-27 DIAGNOSIS — R053 Chronic cough: Secondary | ICD-10-CM

## 2021-08-27 MED ORDER — CEFUROXIME AXETIL 500 MG PO TABS: 500.0000 mg | ORAL_TABLET | Freq: Two times a day (BID) | ORAL | 0 refills | Status: DC

## 2021-08-27 MED ORDER — CHLORPHENIRAMINE MALEATE 4 MG PO TABS: 4.0000 mg | ORAL_TABLET | Freq: Every evening | ORAL | 2 refills | Status: DC

## 2021-08-27 MED ORDER — MONTELUKAST SODIUM 10 MG PO TABS: 10.0000 mg | ORAL_TABLET | Freq: Every day | ORAL | 5 refills | Status: DC

## 2021-08-27 MED ORDER — AZELASTINE HCL 0.1 % NA SOLN: 1.0000 | Freq: Two times a day (BID) | NASAL | 12 refills | Status: DC

## 2021-08-27 MED ORDER — ALBUTEROL SULFATE HFA 108 (90 BASE) MCG/ACT IN AERS: 2.0000 | INHALATION_SPRAY | Freq: Four times a day (QID) | RESPIRATORY_TRACT | 5 refills | Status: DC | PRN

## 2021-08-27 NOTE — Patient Instructions (Addendum)
Nasal irrigation (saline nasal spray) daily Flonase 1 spray in each nostril daily Azelastine 1 spray in each nostril twice per day Montelukast 10 mg pill nightly Chlorpheniramine 4 mg pill nightly Sip water when you have the urge to cough Salt water gargle twice per day 1 teaspoon of local honey daily Sip water when you have an urge to cough or clear your throat Albuterol 2 puffs every 6 hours as needed for cough, wheeze, or chest congestion  Follow up in 4 weeks with Dr. Halford Chessman or Nurse Practioner

## 2021-08-27 NOTE — Progress Notes (Signed)
[facility]  Chief Complaint  Patient presents with   Consult    Cough, and mucus since covid in the last week of may.    Past Surgical History:  She  has a past surgical history that includes Cesarean section; Appendectomy; Dilation and curettage of uterus; Eye surgery; Total hip arthroplasty (Left, 06/19/2019); Reduction mammaplasty (1988); Back surgery (11/2019); and Colonoscopy with propofol (N/A, 08/26/2020).  Past Medical History:  Allergies, Depression, Headache, Hyperlipidemia, Osteopenia, Spinal stenosis, Vertigo, COVID 12 Mar 2021  Constitutional:  BP 130/70 (BP Location: Left Arm, Patient Position: Sitting, Cuff Size: Normal)   Pulse 72   Temp 98.4 F (36.9 C) (Oral)   Ht 5\' 2"  (1.575 m)   Wt 174 lb 9.6 oz (79.2 kg)   LMP  (LMP Unknown)   SpO2 98%   BMI 31.93 kg/m   Brief Summary:  Breanna Davidson is a 70 y.o. female with chronic cough.      Subjective:   She was diagnosed with COVID infection in May 2022.  She had chest xray on 03/30/21 that was normal.  By the end of June 2022 she still had some fatigue and cough.  She went on trip to [place] in the early Fall.   She was seen in August by PCP for persistent cough and fatigue.  She had dizziness and dyspnea.  She had been using flonase.  She was treated with claritin.  She was prescribed tussionex and tessalon.  She was prescribed on prilosec, but never used this.  She feels that tussionex helps her cough, but she is worried about long term opiate use.  Tessalon makes her feels too sleepy.  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  She has been using flonase and nasal irrigation.  Hasn't been using claritin recently.  Doesn't feel like she has reflux, chest congestion, or wheeze from chest.  She feels sore in her throat and feels like something is stuck in her throat.  She has to clear her throat frequently.  Strong smells trigger her cough.  She  never had problem with allergies or asthma before getting COVID.  No history of tobacco smoking.  She doesn't feel like her breathing limits her activity level.  No skin rash, joint swelling, or leg swelling.  She has episodes of vomiting during more severe coughing spells.  She has muscle soreness in her chest, abdomen and thighs from coughing spells.  Chest xray from 08/04/21 was normal.  Labs from 04/13/21: WBC 8, Hb 12.4, PLT 277, Eos 0.3 x 10E3/uL, Na 141, K 4.4, CO2 24, Creatinine 0.69, Ca 9.3, Glucose 83, AST 17, ALT 18, ALP 60, Bili 0.3, albumin 4.1.  Physical Exam:   Appearance - well kempt   ENMT - no sinus tenderness, no oral exudate, no LAN, Mallampati 2 airway, no stridor, mild erythema of posterior pharynx, deviated septum, clear nasal discharge, small scab in left ear canal  Respiratory - equal breath sounds bilaterally, no wheezing or rales  CV - s1s2 regular rate and rhythm, no murmurs  Ext - no clubbing, no edema  Skin - no rashes  Psych - normal mood and affect   Pulmonary testing:    Chest Imaging:    Social History:  She  reports that she has never smoked. She has never used smokeless tobacco. She reports that she does not currently use alcohol after a past usage of about 1.0 standard drink per week. She  reports that she does not use drugs.  Family History:  Her family history includes Breast cancer in her maternal aunt and maternal grandmother; Breast cancer (age of onset: 66) in her mother; CAD in her father; Cancer in her mother; Cystic fibrosis in her daughter; Emphysema in her mother; Hyperlipidemia in her sister; Lung disease in her mother; Mental illness in her daughter.    Discussion:  She has chronic cough that developed after having COVID 19 infection in May 2022.  Her symptoms seem to be related to upper airway with sinus congestion, post nasal drip, sore throat, and globus sensation.  I am less concerned about asthma and reflux as a cause of her  cough.  Assessment/Plan:   Chronic cough. - will have her use flonase 1 spray daily, azelastine 1 spray bid, montelukast 10 mg qhs, chlorpheniramine 4 mg qhs - she is to continue nasal irrigation - will have her try salt water gargles bid, sip water with urge to cough, use sugarless candy to maintain salivary flow, and 1 tsp local honey as antitussive - will give course of ceftin 500 mg bid for 7 days - she can try albuterol 2 puffs q6h prn for cough, wheeze, or chest congestion - she has appointment scheduled with Dr. Benjamine Mola with ENT - explained it can be a slow process for cough to improve with above therapies - if she isn't improving at next follow up, then she she will need PFT, CT imaging of sinus and chest, and additional allergy testing  Time Spent Involved in Patient Care on Day of Examination:  50 minutes  Follow up:   Patient Instructions  Nasal irrigation (saline nasal spray) daily Flonase 1 spray in each nostril daily Azelastine 1 spray in each nostril twice per day Montelukast 10 mg pill nightly Chlorpheniramine 4 mg pill nightly Sip water when you have the urge to cough Salt water gargle twice per day 1 teaspoon of local honey daily Sip water when you have an urge to cough or clear your throat Albuterol 2 puffs every 6 hours as needed for cough, wheeze, or chest congestion  Follow up in 4 weeks with Dr. Halford Chessman or Nurse Practioner  Medication List:   Allergies as of 08/27/2021       Reactions   Combigan [brimonidine Tartrate-timolol] Other (See Comments)   Irritated eyes, redness and itching   Prednisone Other (See Comments)   Altered Mental Status        Medication List        Accurate as of August 27, 2021 10:35 AM. If you have any questions, ask your nurse or doctor.          albuterol 108 (90 Base) MCG/ACT inhaler Commonly known as: VENTOLIN HFA Inhale 2 puffs into the lungs every 6 (six) hours as needed for wheezing or shortness of  breath. Started by: Chesley Mires, MD   azelastine 0.1 % nasal spray Commonly known as: ASTELIN Place 1 spray into both nostrils 2 (two) times daily. Use in each nostril as directed Started by: Chesley Mires, MD   benzonatate 100 MG capsule Commonly known as: TESSALON Take 1 capsule (100 mg total) by mouth 2 (two) times daily as needed for cough.   CALCIUM-MAGNESIUM PO Take 1 tablet by mouth daily.   cefUROXime 500 MG tablet Commonly known as: CEFTIN Take 1 tablet (500 mg total) by mouth 2 (two) times daily with a meal. Started by: Chesley Mires, MD   chlorpheniramine 4 MG tablet Commonly known  as: CHLOR-TRIMETON Take 1 tablet (4 mg total) by mouth at bedtime. Started by: Chesley Mires, MD   chlorpheniramine-HYDROcodone 10-8 MG/5ML Suer Commonly known as: Tussionex Pennkinetic ER Take 5 mLs by mouth at bedtime as needed for cough.   CO Q-10 PLUS RED YEAST RICE PO Take 1 capsule by mouth daily.   Dorzolamide HCl-Timolol Mal PF 2-0.5 % Soln Apply 1 drop to eye daily.   Fish Oil 1000 MG Caps Take 1,000 mg by mouth daily.   fluticasone 50 MCG/ACT nasal spray Commonly known as: FLONASE Place 2 sprays into both nostrils daily.   montelukast 10 MG tablet Commonly known as: SINGULAIR Take 1 tablet (10 mg total) by mouth at bedtime. Started by: Chesley Mires, MD   nystatin cream Commonly known as: MYCOSTATIN Apply 1 application topically 2 (two) times daily.   Vitamin D 50 MCG (2000 UT) Caps Take 2,000 Units by mouth daily.        Signature:  Chesley Mires, MD [place] Pager - [phone] 08/27/2021, 10:35 AM

## 2021-09-01 ENCOUNTER — Telehealth: Payer: Self-pay | Admitting: Pulmonary Disease

## 2021-09-01 NOTE — Telephone Encounter (Signed)
Patient is aware of below message and voiced her understanding.  Nothing further needed.   

## 2021-09-01 NOTE — Telephone Encounter (Signed)
Okay for her to get COVID booster.

## 2021-09-01 NOTE — Telephone Encounter (Signed)
Spoke to patient, who is questioning if it okay for her to get the variant covid booster.  Dr. Halford Chessman, please advise. thanks

## 2021-09-08 ENCOUNTER — Telehealth: Payer: Self-pay | Admitting: Pulmonary Disease

## 2021-09-08 NOTE — Telephone Encounter (Signed)
Called and spoke with patient to let her know the recs from Dr. Halford Chessman. She expressed understanding and stated that she is going to stop the Singulair for a week and see if her symptoms get better. She states that she has been on red yeast rice for many years and does not think that is the issue. Advised her to let us know if she needs anything else.

## 2021-09-08 NOTE — Telephone Encounter (Signed)
This is not a typical side effect from singulair.  She could try stopping this for a few days and see if her symptoms improve.  If not, then she should check with her PCP.  Red yeast rice for cholesterol sometimes can cause muscle pains, and she should check with her PCP about whether she should try stopping this also.

## 2021-09-08 NOTE — Telephone Encounter (Signed)
Called and spoke with patient who states that she is having aches her body and in legs,and back. She states it started a few days ago. She states that she feels like she has fibromyalgia. Is asking if it could be a side effect/reaction to the singulair. Started singular on 07/28/2021   Dr. Halford Chessman please advise

## 2021-09-08 NOTE — Telephone Encounter (Signed)
I have called the pt and LM on VM for her to call back.  

## 2021-09-09 ENCOUNTER — Ambulatory Visit (INDEPENDENT_AMBULATORY_CARE_PROVIDER_SITE_OTHER): Payer: Medicare Other | Admitting: *Deleted

## 2021-09-09 ENCOUNTER — Ambulatory Visit: Payer: Medicare Other

## 2021-09-09 DIAGNOSIS — Z780 Asymptomatic menopausal state: Secondary | ICD-10-CM | POA: Diagnosis not present

## 2021-09-09 DIAGNOSIS — Z1231 Encounter for screening mammogram for malignant neoplasm of breast: Secondary | ICD-10-CM

## 2021-09-09 DIAGNOSIS — Z0000 Encounter for general adult medical examination without abnormal findings: Secondary | ICD-10-CM

## 2021-09-09 NOTE — Progress Notes (Signed)
Subjective:   AMAIYA SCRUTON is a 70 y.o. female who presents for Medicare Annual (Subsequent) preventive examination.  I connected with  CORALYNN GAONA on 09/09/21 by a telephone enabled telemedicine application and verified that I am speaking with the correct person using two identifiers.   I discussed the limitations of evaluation and management by telemedicine. The patient expressed understanding and agreed to proceed.  Patient location: home  Provider location: tele-health not in office    Review of Systems     Cardiac Risk Factors include: advanced age (>47men, >16 women);obesity (BMI >30kg/m2)     Objective:    Today's Vitals   09/09/21 1250  PainSc: 3    There is no height or weight on file to calculate BMI.  Advanced Directives 09/09/2021 09/08/2020 08/26/2020 12/13/2019 12/12/2019 12/10/2019 08/20/2019  Does Patient Have a Medical Advance Directive? Yes Yes Yes Yes Yes Yes Yes  Type of Paramedic of [place];Living will Living will;Healthcare Power of Attorney Living will - Living will Living will;Healthcare Power of Attorney  Does patient want to make changes to medical advance directive? - - - No - Patient declined - - -  Copy of [place] in Chart? Yes - validated most recent copy scanned in chart (See row information) Yes - validated most recent copy scanned in chart (See row information) No - copy requested - - - -    Current Medications (verified) Outpatient Encounter Medications as of 09/09/2021  Medication Sig   azelastine (ASTELIN) 0.1 % nasal spray Place 1 spray into both nostrils 2 (two) times daily. Use in each nostril as directed   CALCIUM-MAGNESIUM PO Take 1 tablet by mouth daily.   cefUROXime (CEFTIN) 500 MG tablet Take 1 tablet (500 mg total) by mouth 2 (two) times daily with a meal.   chlorpheniramine (CHLOR-TRIMETON) 4 MG tablet Take 1 tablet (4 mg total) by mouth at bedtime.    Cholecalciferol (VITAMIN D) 50 MCG (2000 UT) CAPS Take 2,000 Units by mouth daily.    Dorzolamide HCl-Timolol Mal PF 2-0.5 % SOLN Apply 1 drop to eye daily.   Omega-3 Fatty Acids (FISH OIL) 1000 MG CAPS Take 1,000 mg by mouth daily.   albuterol (VENTOLIN HFA) 108 (90 Base) MCG/ACT inhaler Inhale 2 puffs into the lungs every 6 (six) hours as needed for wheezing or shortness of breath. (Patient not taking: Reported on 09/09/2021)   benzonatate (TESSALON) 100 MG capsule Take 1 capsule (100 mg total) by mouth 2 (two) times daily as needed for cough. (Patient not taking: Reported on 09/09/2021)   chlorpheniramine-HYDROcodone (TUSSIONEX PENNKINETIC ER) 10-8 MG/5ML SUER Take 5 mLs by mouth at bedtime as needed for cough. (Patient not taking: Reported on 09/09/2021)   Coenzyme Q10-Red Yeast Rice (CO Q-10 PLUS RED YEAST RICE PO) Take 1 capsule by mouth daily. (Patient not taking: Reported on 09/09/2021)   fluticasone (FLONASE) 50 MCG/ACT nasal spray Place 2 sprays into both nostrils daily. (Patient not taking: Reported on 09/09/2021)   montelukast (SINGULAIR) 10 MG tablet Take 1 tablet (10 mg total) by mouth at bedtime. (Patient not taking: Reported on 09/09/2021)   nystatin cream (MYCOSTATIN) Apply 1 application topically 2 (two) times daily. (Patient not taking: Reported on 09/09/2021)   No facility-administered encounter medications on file as of 09/09/2021.    Allergies (verified) Combigan [brimonidine tartrate-timolol] and Prednisone   History: Past Medical History:  Diagnosis Date   Allergy    Chest pain  2017 ED VISIT FOR CHEST PAIN , R/O AS GASTRIC CAUSE    Depression    per NP notes, on cymbalta   Headache    per patient very rare sometimes when not well rested   Hyperlipidemia    Increased pressure in the eye    "THEY DID SURGERY ON MY EYES TO REDUCE THE PRESSURE , THEY MADE TWO HOLES IN MY EYES "   Osteopenia    Spinal stenosis    LUMBAR    Vertigo    IMPROVED BUT HAS  OCCASIONAL SX   Past Surgical History:  Procedure Laterality Date   APPENDECTOMY     BACK SURGERY  11/2019   CESAREAN SECTION     COLONOSCOPY WITH PROPOFOL N/A 08/26/2020   Procedure: COLONOSCOPY WITH PROPOFOL;  Surgeon: Virgel Manifold, MD;  Location: [facility] ENDOSCOPY;  Service: Endoscopy;  Laterality: N/A;   DILATION AND CURETTAGE OF UTERUS     EYE SURGERY     "THEY DID SURGERY ON MY EYES TO REDUCE THE PRESSURE , THEY MADE TWO HOLES IN MY EYES "   REDUCTION MAMMAPLASTY  1988   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   Procedure: TOTAL HIP ARTHROPLASTY ANTERIOR APPROACH;  Surgeon: Renette Butters, MD;  Location: [facility];  Service: Orthopedics;  Laterality: Left;   Family History  Problem Relation Age of Onset   Breast cancer Mother 33       and again at 54   Lung disease Mother    Cancer Mother    Emphysema Mother    Breast cancer Maternal Grandmother    CAD Father    Hyperlipidemia Sister    Mental illness Daughter        bipolar   Cystic fibrosis Daughter    Breast cancer Maternal Aunt    Social History   Socioeconomic History   Marital status: Married    Spouse name: Not on file   Number of children: Not on file   Years of education: college    Highest education level: Not on file  Occupational History   Occupation: retired   Tobacco Use   Smoking status: Never   Smokeless tobacco: Never  Vaping Use   Vaping Use: Never used  Substance and Sexual Activity   Alcohol use: Not Currently    Alcohol/week: 1.0 standard drink    Types: 1 Shots of liquor per week   Drug use: No   Sexual activity: Yes  Other Topics Concern   Not on file  Social History Narrative   Not on file   Social Determinants of Health   Financial Resource Strain: Low Risk    Difficulty of Paying Living Expenses: Not hard at all  Food Insecurity: No Food Insecurity   Worried About Charity fundraiser in the Last Year: Never true   [place] in the Last Year: Never true   Transportation Needs: No Transportation Needs   Lack of Transportation (Medical): No   Lack of Transportation (Non-Medical): No  Physical Activity: Sufficiently Active   Days of Exercise per Week: 4 days   Minutes of Exercise per Session: 40 min  Stress: No Stress Concern Present   Feeling of Stress : Not at all  Social Connections: Moderately Isolated   Frequency of Communication with Friends and Family: Three times a week   Frequency of Social Gatherings with Friends and Family: More than three times a week   Attends Religious Services: Never   Active Member of  Clubs or Organizations: No   Attends Archivist Meetings: Never   Marital Status: Married    Tobacco Counseling Counseling given: Not Answered   Clinical Intake:  Pre-visit preparation completed: Yes  Pain : 0-10 Pain Score: 3  Pain Location: Arm Pain Orientation: Right, Left Pain Descriptors / Indicators: Sharp, Aching Pain Onset: 1 to 4 weeks ago Pain Frequency: Several days a week Effect of Pain on Daily Activities: tylenol     Nutritional Risks: None Diabetes: No  How often do you need to have someone help you when you read instructions, pamphlets, or other written materials from your doctor or pharmacy?: 1 - Never  Diabetic?  no  Interpreter Needed?: No  Information entered by :: Leroy Kennedy LPN   Activities of Daily Living In your present state of health, do you have any difficulty performing the following activities: 09/09/2021 09/22/2020  Hearing? N N  Vision? N N  Difficulty concentrating or making decisions? N N  Walking or climbing stairs? N N  Dressing or bathing? N N  Doing errands, shopping? N N  Preparing Food and eating ? N -  Using the Toilet? N -  In the past six months, have you accidently leaked urine? N -  Do you have problems with loss of bowel control? N -  Managing your Medications? N -  Managing your Finances? N -  Housekeeping or managing your Housekeeping? N  -  Some recent data might be hidden    Patient Care Team: Venita Lick, NP as PCP - General (Nurse Practitioner)  Indicate any recent Medical Services you may have received from other than [facility] providers in the past year (date may be approximate).     Assessment:   This is a routine wellness examination for Exie.  Hearing/Vision screen Hearing Screening - Comments:: No trouble hearing  Vision Screening - Comments:: Up to date [facility]  Dietary issues and exercise activities discussed: Current Exercise Habits: Home exercise routine, Type of exercise: walking (golf), Time (Minutes): 55, Frequency (Times/Week): 4, Weekly Exercise (Minutes/Week): 220, Intensity: Moderate   Goals Addressed             This Visit's Progress    Patient Stated   Not on track    09/08/2020, wants to lose 10 pounds     Weight (lb) < 200 lb (90.7 kg)       Loose wieght       Depression Screen PHQ 2/9 Scores 09/09/2021 08/03/2021 04/13/2021 11/25/2020 09/08/2020 07/30/2020 01/29/2020  PHQ - 2 Score 0 0 0 0 0 0 0  PHQ- 9 Score 0 0 4 0 0 0 1    Fall Risk Fall Risk  09/09/2021 08/03/2021 06/05/2021 04/13/2021 11/25/2020  Falls in the past year? 0 0 0 0 0  Comment - - - - -  Number falls in past yr: 0 0 0 0 -  Injury with Fall? 0 0 0 0 -  Risk for fall due to : - No Fall Risks No Fall Risks No Fall Risks -  Follow up Falls evaluation completed;Falls prevention discussed Falls evaluation completed Falls evaluation completed Falls evaluation completed -    FALL RISK PREVENTION PERTAINING TO THE HOME:  Any stairs in or around the home? Yes  If so, are there any without handrails? No  Home free of loose throw rugs in walkways, pet beds, electrical cords, etc? Yes  Adequate lighting in your home to reduce risk of falls? Yes  ASSISTIVE DEVICES UTILIZED TO PREVENT FALLS:  Life alert? No  Use of a cane, walker or w/c? No  Grab bars in the bathroom? No  Shower chair or bench in  shower? No  Elevated toilet seat or a handicapped toilet? No   TIMED UP AND GO:  Was the test performed? No .    Cognitive Function:  Normal cognitive status assessed by direct observation by this Nurse Health Advisor. No abnormalities found.       6CIT Screen 09/08/2020 08/20/2019 06/23/2018 06/23/2017  What Year? 0 points 0 points 0 points 0 points  What month? 0 points 0 points 0 points 0 points  What time? 0 points 0 points 0 points 0 points  Count back from 20 0 points 0 points 0 points 0 points  Months in reverse 0 points 0 points 0 points 0 points  Repeat phrase 2 points 0 points 0 points 0 points  Total Score 2 0 0 0    Immunizations Immunization History  Administered Date(s) Administered   Fluad Quad(high Dose 65+) 07/31/2019, 08/03/2021   Hepatitis A, Adult 04/13/2021   Hepatitis B, adult 04/13/2021, 05/13/2021   Influenza, High Dose Seasonal PF 07/23/2016, 07/26/2017, 08/01/2018   Influenza-Unspecified 07/13/2020   PFIZER(Purple Top)SARS-COV-2 Vaccination 11/14/2019, 12/02/2019, 08/13/2020   Pneumococcal Conjugate-13 07/23/2016   Pneumococcal Polysaccharide-23 07/26/2017   Td 11/13/2004, 04/13/2021   Zoster, Live 09/12/2015    TDAP status: Up to date  Flu Vaccine status: Up to date  Pneumococcal vaccine status: Up to date  Covid-19 vaccine status: Information provided on how to obtain vaccines.   Qualifies for Shingles Vaccine? Yes   Zostavax completed Yes   Shingrix Completed?: No.    Education has been provided regarding the importance of this vaccine. Patient has been advised to call insurance company to determine out of pocket expense if they have not yet received this vaccine. Advised may also receive vaccine at local pharmacy or Health Dept. Verbalized acceptance and understanding.  Screening Tests Health Maintenance  Topic Date Due   Zoster Vaccines- Shingrix (1 of 2) Never done   COVID-19 Vaccine (4 - Booster for Pfizer series) 09/12/2021  (Originally 10/08/2020)   MAMMOGRAM  10/07/2022   COLONOSCOPY (Pts 45-75yrs Insurance coverage will need to be confirmed)  08/27/2023   TETANUS/TDAP  04/14/2031   Pneumonia Vaccine 43+ Years old  Completed   INFLUENZA VACCINE  Completed   DEXA SCAN  Completed   Hepatitis C Screening  Completed   HPV VACCINES  Aged Out    Health Maintenance  Health Maintenance Due  Topic Date Due   Zoster Vaccines- Shingrix (1 of 2) Never done    Colorectal cancer screening: Type of screening: Colonoscopy. Completed 2021. Repeat every 3 years  Mammogram status: Ordered  . Pt provided with contact info and advised to call to schedule appt.   Bone Density status: Ordered  . Pt provided with contact info and advised to call to schedule appt.  Lung Cancer Screening: (Low Dose CT Chest recommended if Age 89-80 years, 30 pack-year currently smoking OR have quit w/in 15years.) does not qualify.   Lung Cancer Screening Referral:   Additional Screening:  Hepatitis C Screening: does not qualify; Completed 2017  Vision Screening: Recommended annual ophthalmology exams for early detection of glaucoma and other disorders of the eye. Is the patient up to date with their annual eye exam?  Yes  Who is the provider or what is the name of the office in which the patient  attends annual eye exams? [facility] If pt is not established with a provider, would they like to be referred to a provider to establish care? No .   Dental Screening: Recommended annual dental exams for proper oral hygiene  Community Resource Referral / Chronic Care Management: CRR required this visit?  No   CCM required this visit?  No      Plan:     I have personally reviewed and noted the following in the patient's chart:   Medical and social history Use of alcohol, tobacco or illicit drugs  Current medications and supplements including opioid prescriptions.  Functional ability and status Nutritional status Physical  activity Advanced directives List of other physicians Hospitalizations, surgeries, and ER visits in previous 12 months Vitals Screenings to include cognitive, depression, and falls Referrals and appointments  In addition, I have reviewed and discussed with patient certain preventive protocols, quality metrics, and best practice recommendations. A written personalized care plan for preventive services as well as general preventive health recommendations were provided to patient.     Leroy Kennedy, LPN   [id number]   Nurse Notes:

## 2021-09-09 NOTE — Patient Instructions (Signed)
Breanna Davidson , Thank you for taking time to come for your Medicare Wellness Visit. I appreciate your ongoing commitment to your health goals. Please review the following plan we discussed and let me know if I can assist you in the future.   Screening recommendations/referrals: Colonoscopy: up to date Mammogram: Education provided Bone Density: Education provided Recommended yearly ophthalmology/optometry visit for glaucoma screening and checkup Recommended yearly dental visit for hygiene and checkup  Vaccinations: Influenza vaccine: up to date Pneumococcal vaccine: up to date Tdap vaccine: up to date Shingles vaccine: Education provided    Advanced directives: on file  Conditions/risks identified:   Next appointment: 10-13-2021 @ 8:40 [place] 65 Years and Older, Female Preventive care refers to lifestyle choices and visits with your health care provider that can promote health and wellness. What does preventive care include? A yearly physical exam. This is also called an annual well check. Dental exams once or twice a year. Routine eye exams. Ask your health care provider how often you should have your eyes checked. Personal lifestyle choices, including: Daily care of your teeth and gums. Regular physical activity. Eating a healthy diet. Avoiding tobacco and drug use. Limiting alcohol use. Practicing safe sex. Taking low-dose aspirin every day. Taking vitamin and mineral supplements as recommended by your health care provider. What happens during an annual well check? The services and screenings done by your health care provider during your annual well check will depend on your age, overall health, lifestyle risk factors, and family history of disease. Counseling  Your health care provider may ask you questions about your: Alcohol use. Tobacco use. Drug use. Emotional well-being. Home and relationship well-being. Sexual activity. Eating habits. History  of falls. Memory and ability to understand (cognition). Work and work Statistician. Reproductive health. Screening  You may have the following tests or measurements: Height, weight, and BMI. Blood pressure. Lipid and cholesterol levels. These may be checked every 5 years, or more frequently if you are over 69 years old. Skin check. Lung cancer screening. You may have this screening every year starting at age 92 if you have a 30-pack-year history of smoking and currently smoke or have quit within the past 15 years. Fecal occult blood test (FOBT) of the stool. You may have this test every year starting at age 72. Flexible sigmoidoscopy or colonoscopy. You may have a sigmoidoscopy every 5 years or a colonoscopy every 10 years starting at age 64. Hepatitis C blood test. Hepatitis B blood test. Sexually transmitted disease (STD) testing. Diabetes screening. This is done by checking your blood sugar (glucose) after you have not eaten for a while (fasting). You may have this done every 1-3 years. Bone density scan. This is done to screen for osteoporosis. You may have this done starting at age 29. Mammogram. This may be done every 1-2 years. Talk to your health care provider about how often you should have regular mammograms. Talk with your health care provider about your test results, treatment options, and if necessary, the need for more tests. Vaccines  Your health care provider may recommend certain vaccines, such as: Influenza vaccine. This is recommended every year. Tetanus, diphtheria, and acellular pertussis (Tdap, Td) vaccine. You may need a Td booster every 10 years. Zoster vaccine. You may need this after age 18. Pneumococcal 13-valent conjugate (PCV13) vaccine. One dose is recommended after age 33. Pneumococcal polysaccharide (PPSV23) vaccine. One dose is recommended after age 75. Talk to your health care provider about which  screenings and vaccines you need and how often you need  them. This information is not intended to replace advice given to you by your health care provider. Make sure you discuss any questions you have with your health care provider. Document Released: 11/07/2015 Document Revised: 06/30/2016 Document Reviewed: 08/12/2015 Elsevier Interactive Patient Education  2017 [place] Prevention in the Home Falls can cause injuries. They can happen to people of all ages. There are many things you can do to make your home safe and to help prevent falls. What can I do on the outside of my home? Regularly fix the edges of walkways and driveways and fix any cracks. Remove anything that might make you trip as you walk through a door, such as a raised step or threshold. Trim any bushes or trees on the path to your home. Use bright outdoor lighting. Clear any walking paths of anything that might make someone trip, such as rocks or tools. Regularly check to see if handrails are loose or broken. Make sure that both sides of any steps have handrails. Any raised decks and porches should have guardrails on the edges. Have any leaves, snow, or ice cleared regularly. Use sand or salt on walking paths during winter. Clean up any spills in your garage right away. This includes oil or grease spills. What can I do in the bathroom? Use night lights. Install grab bars by the toilet and in the tub and shower. Do not use towel bars as grab bars. Use non-skid mats or decals in the tub or shower. If you need to sit down in the shower, use a plastic, non-slip stool. Keep the floor dry. Clean up any water that spills on the floor as soon as it happens. Remove soap buildup in the tub or shower regularly. Attach bath mats securely with double-sided non-slip rug tape. Do not have throw rugs and other things on the floor that can make you trip. What can I do in the bedroom? Use night lights. Make sure that you have a light by your bed that is easy to reach. Do not use  any sheets or blankets that are too big for your bed. They should not hang down onto the floor. Have a firm chair that has side arms. You can use this for support while you get dressed. Do not have throw rugs and other things on the floor that can make you trip. What can I do in the kitchen? Clean up any spills right away. Avoid walking on wet floors. Keep items that you use a lot in easy-to-reach places. If you need to reach something above you, use a strong step stool that has a grab bar. Keep electrical cords out of the way. Do not use floor polish or wax that makes floors slippery. If you must use wax, use non-skid floor wax. Do not have throw rugs and other things on the floor that can make you trip. What can I do with my stairs? Do not leave any items on the stairs. Make sure that there are handrails on both sides of the stairs and use them. Fix handrails that are broken or loose. Make sure that handrails are as long as the stairways. Check any carpeting to make sure that it is firmly attached to the stairs. Fix any carpet that is loose or worn. Avoid having throw rugs at the top or bottom of the stairs. If you do have throw rugs, attach them to the floor with carpet  tape. Make sure that you have a light switch at the top of the stairs and the bottom of the stairs. If you do not have them, ask someone to add them for you. What else can I do to help prevent falls? Wear shoes that: Do not have high heels. Have rubber bottoms. Are comfortable and fit you well. Are closed at the toe. Do not wear sandals. If you use a stepladder: Make sure that it is fully opened. Do not climb a closed stepladder. Make sure that both sides of the stepladder are locked into place. Ask someone to hold it for you, if possible. Clearly mark and make sure that you can see: Any grab bars or handrails. First and last steps. Where the edge of each step is. Use tools that help you move around (mobility aids)  if they are needed. These include: Canes. Walkers. Scooters. Crutches. Turn on the lights when you go into a dark area. Replace any light bulbs as soon as they burn out. Set up your furniture so you have a clear path. Avoid moving your furniture around. If any of your floors are uneven, fix them. If there are any pets around you, be aware of where they are. Review your medicines with your doctor. Some medicines can make you feel dizzy. This can increase your chance of falling. Ask your doctor what other things that you can do to help prevent falls. This information is not intended to replace advice given to you by your health care provider. Make sure you discuss any questions you have with your health care provider. Document Released: 08/07/2009 Document Revised: 03/18/2016 Document Reviewed: 11/15/2014 Elsevier Interactive Patient Education  2017 [employer].

## 2021-09-22 DIAGNOSIS — J310 Chronic rhinitis: Secondary | ICD-10-CM | POA: Diagnosis not present

## 2021-09-22 DIAGNOSIS — H6983 Other specified disorders of Eustachian tube, bilateral: Secondary | ICD-10-CM | POA: Diagnosis not present

## 2021-09-22 DIAGNOSIS — J343 Hypertrophy of nasal turbinates: Secondary | ICD-10-CM | POA: Diagnosis not present

## 2021-09-22 DIAGNOSIS — H903 Sensorineural hearing loss, bilateral: Secondary | ICD-10-CM | POA: Diagnosis not present

## 2021-09-22 DIAGNOSIS — R0982 Postnasal drip: Secondary | ICD-10-CM | POA: Diagnosis not present

## 2021-09-24 ENCOUNTER — Encounter: Payer: Self-pay | Admitting: Adult Health

## 2021-09-24 ENCOUNTER — Other Ambulatory Visit: Payer: Self-pay

## 2021-09-24 ENCOUNTER — Ambulatory Visit: Payer: Medicare Other | Admitting: Adult Health

## 2021-09-24 DIAGNOSIS — M25511 Pain in right shoulder: Secondary | ICD-10-CM | POA: Diagnosis not present

## 2021-09-24 DIAGNOSIS — M25512 Pain in left shoulder: Secondary | ICD-10-CM | POA: Diagnosis not present

## 2021-09-24 DIAGNOSIS — R053 Chronic cough: Secondary | ICD-10-CM

## 2021-09-24 DIAGNOSIS — J301 Allergic rhinitis due to pollen: Secondary | ICD-10-CM | POA: Diagnosis not present

## 2021-09-24 DIAGNOSIS — M2550 Pain in unspecified joint: Secondary | ICD-10-CM | POA: Insufficient documentation

## 2021-09-24 NOTE — Assessment & Plan Note (Signed)
Bilateral shoulder pain /stiffness and decreased ROM ? Etiology . Doubt from Singulair  follow up with PCP tomorrow as planned  Please contact office for sooner follow up if symptoms do not improve or worsen or seek emergency care

## 2021-09-24 NOTE — Progress Notes (Signed)
Reviewed and agree with assessment/plan.   Chesley Mires, MD [facility] Pulmonary/Critical Care 09/24/2021, 2:47 PM Pager:  [phone]

## 2021-09-24 NOTE — Patient Instructions (Addendum)
Nasal irrigation (saline nasal spray) daily Flonase 1 spray in each nostril daily Azelastine 1 spray in each nostril twice per day Chlorpheniramine 4 mg pill nightly Sip water when you have the urge to cough Salt water gargle twice per day 1 teaspoon of local honey daily Delsym 2 tsp Twice daily  As needed  cough .  Sip water when you have an urge to cough or clear your throat Albuterol 2 puffs every 6 hours as needed for cough, wheeze, or chest congestion Follow up with PCP tomorrow as planned for joint pains Follow up in 3 months with Dr. Halford Chessman or Nurse Practitioner Please contact office for sooner follow up if symptoms do not improve or worsen or seek emergency care

## 2021-09-24 NOTE — Progress Notes (Signed)
@Patient  ID: Breanna Davidson, female    DOB: 1951/01/03, 70 y.o.   MRN: [id number]  Chief Complaint  Patient presents with   Follow-up    Referring provider: Venita Lick, NP  HPI: 70 yo female seen for pulmonary consult on 08/27/21 for chronic cough post covid 19 infection for 6 months.  From [place], speaks [place] and french .   TEST/EVENTS :   Cough work up :  Covid 19 infection 02/2021  [place] trip 03/2021  Initial tx -flonase, claritin, Singulair tussionex , PPI (did not take)  , Unable to take steroids  Chest xray 03/2021 and 07/2021  Clear lungs  Normal spirometry 08/03/21   09/24/2021 Follow up : Post Covid Chronic Cough  Patient presents for a 1 month follow-up.  Patient was seen last visit for a pulmonary consult for a chronic cough that has been present for the last 6 months since she had COVID-19 infection in May 2022.  She was initially treated with chronic cough treatment with Flonase,  claritin, Tussionex.  She was recommended on PPI therapy for possible reflux.  But she did not take this.  She is also unable to take steroids due to side effects.  Last visit patient was recommended to continue on current regimen.  Was started on Singulair and Chlor tabs 4 mg at bedtime. And a 7 day course of Cefuroxime , Along with cough control regimen. As above chest x-ray in June and in October with both clear.  Previous spirometry  in October was normal.  She is a never smoker.  Since last visit patient says her cough is better.  Was seen by ENT , told chronic rhnitis , recommended on nasal spray .  Complains that she developed bilateral shoulder pain , stiffness, decreased ROM over last 2-3 weeks. She stopped Singulair because thought it might be coming from this . No improvement since stopping Singulair . Going to PCP tomorrow . Intermittent pains in behind knees.     Allergies  Allergen Reactions   Combigan [Brimonidine Tartrate-Timolol] Other (See Comments)     Irritated eyes, redness and itching   Prednisone Other (See Comments)    Altered Mental Status    Immunization History  Administered Date(s) Administered   Fluad Quad(high Dose 65+) 07/31/2019, 08/03/2021   Hepatitis A, Adult 04/13/2021   Hepatitis B, adult 04/13/2021, 05/13/2021   Influenza, High Dose Seasonal PF 07/23/2016, 07/26/2017, 08/01/2018   Influenza-Unspecified 07/13/2020   PFIZER(Purple Top)SARS-COV-2 Vaccination 11/14/2019, 12/02/2019, 08/13/2020   Pneumococcal Conjugate-13 07/23/2016   Pneumococcal Polysaccharide-23 07/26/2017   Td 11/13/2004, 04/13/2021   Zoster, Live 09/12/2015    Past Medical History:  Diagnosis Date   Allergy    Chest pain    2017 ED VISIT FOR CHEST PAIN , R/O AS GASTRIC CAUSE    Depression    per NP notes, on cymbalta   Headache    per patient very rare sometimes when not well rested   Hyperlipidemia    Increased pressure in the eye    "THEY DID SURGERY ON MY EYES TO REDUCE THE PRESSURE , THEY MADE TWO HOLES IN MY EYES "   Osteopenia    Spinal stenosis    LUMBAR    Vertigo    IMPROVED BUT HAS OCCASIONAL SX    Tobacco History: Social History   Tobacco Use  Smoking Status Never  Smokeless Tobacco Never   Counseling given: Not Answered   Outpatient Medications Prior to Visit  Medication Sig Dispense  Refill   azelastine (ASTELIN) 0.1 % nasal spray Place 1 spray into both nostrils 2 (two) times daily. Use in each nostril as directed 30 mL 12   CALCIUM-MAGNESIUM PO Take 1 tablet by mouth daily.     cefUROXime (CEFTIN) 500 MG tablet Take 1 tablet (500 mg total) by mouth 2 (two) times daily with a meal. 14 tablet 0   chlorpheniramine (CHLOR-TRIMETON) 4 MG tablet Take 1 tablet (4 mg total) by mouth at bedtime. 30 tablet 2   chlorpheniramine-HYDROcodone (TUSSIONEX PENNKINETIC ER) 10-8 MG/5ML SUER Take 5 mLs by mouth at bedtime as needed for cough. 70 mL 0   Dorzolamide HCl-Timolol Mal PF 2-0.5 % SOLN Apply 1 drop to eye daily.      Omega-3 Fatty Acids (FISH OIL) 1000 MG CAPS Take 1,000 mg by mouth daily.     albuterol (VENTOLIN HFA) 108 (90 Base) MCG/ACT inhaler Inhale 2 puffs into the lungs every 6 (six) hours as needed for wheezing or shortness of breath. (Patient not taking: Reported on 09/09/2021) 8 g 5   benzonatate (TESSALON) 100 MG capsule Take 1 capsule (100 mg total) by mouth 2 (two) times daily as needed for cough. (Patient not taking: Reported on 09/09/2021) 20 capsule 0   Cholecalciferol (VITAMIN D) 50 MCG (2000 UT) CAPS Take 2,000 Units by mouth daily.  (Patient not taking: Reported on 09/24/2021)     Coenzyme Q10-Red Yeast Rice (CO Q-10 PLUS RED YEAST RICE PO) Take 1 capsule by mouth daily. (Patient not taking: Reported on 09/09/2021)     fluticasone (FLONASE) 50 MCG/ACT nasal spray Place 2 sprays into both nostrils daily. (Patient not taking: Reported on 09/09/2021) 16 g 6   montelukast (SINGULAIR) 10 MG tablet Take 1 tablet (10 mg total) by mouth at bedtime. (Patient not taking: Reported on 09/09/2021) 30 tablet 5   nystatin cream (MYCOSTATIN) Apply 1 application topically 2 (two) times daily. (Patient not taking: Reported on 09/09/2021) 30 g 1   No facility-administered medications prior to visit.     Review of Systems:   Constitutional:   No  weight loss, night sweats,  Fevers, chills, fatigue, or  lassitude.  HEENT:   No headaches,  Difficulty swallowing,  Tooth/dental problems, or  Sore throat,                No sneezing, itching, ear ache, nasal congestion, post nasal drip,   CV:  No chest pain,  Orthopnea, PND, swelling in lower extremities, anasarca, dizziness, palpitations, syncope.   GI  No heartburn, indigestion, abdominal pain, nausea, vomiting, diarrhea, change in bowel habits, loss of appetite, bloody stools.   Resp: No shortness of breath with exertion or at rest.  No excess mucus,   No coughing up of blood.  No change in color of mucus.  No wheezing.  No chest wall deformity  Skin: no  rash or lesions.  GU: no dysuria, change in color of urine, no urgency or frequency.  No flank pain, no hematuria   MS:  + joint pain     Physical Exam  BP (!) 110/54 (BP Location: Left Arm, Patient Position: Sitting, Cuff Size: Normal)   Pulse 100   Temp 98.1 F (36.7 C) (Oral)   Ht 5\' 2"  (1.575 m)   Wt 173 lb 3.2 oz (78.6 kg)   LMP  (LMP Unknown)   SpO2 97%   BMI 31.68 kg/m   GEN: A/Ox3; pleasant , NAD, well nourished    HEENT:  [place]/AT,  NOSE-clear, THROAT-clear, no lesions, no postnasal drip or exudate noted.   NECK:  Supple w/ fair ROM; no JVD; normal carotid impulses w/o bruits; no thyromegaly or nodules palpated; no lymphadenopathy.    RESP  Clear  P & A; w/o, wheezes/ rales/ or rhonchi. no accessory muscle use, no dullness to percussion  CARD:  RRR, no m/r/g, no peripheral edema, pulses intact, no cyanosis or clubbing. Neg Homans sign   GI:   Soft & nt; nml bowel sounds; no organomegaly or masses detected.   Musco: Warm bil, no deformities or joint swelling noted. Normal grips, decreased ROM of bilateral arms due to pain in shoulders.   Neuro: alert, no focal deficits noted.    Skin: Warm, no lesions or rashes    Lab Results:  CBC    Component Value Date/Time   WBC 8.0 04/13/2021 1020   WBC 6.5 12/10/2019 0939   RBC 4.20 04/13/2021 1020   RBC 4.34 12/10/2019 0939   HGB 12.4 04/13/2021 1020   HCT 37.8 04/13/2021 1020   PLT 277 04/13/2021 1020   MCV 90 04/13/2021 1020   MCH 29.5 04/13/2021 1020   MCH 29.5 12/10/2019 0939   MCHC 32.8 04/13/2021 1020   MCHC 31.4 12/10/2019 0939   RDW 13.1 04/13/2021 1020   LYMPHSABS 1.8 04/13/2021 1020   EOSABS 0.3 04/13/2021 1020   BASOSABS 0.0 04/13/2021 1020    BMET    Component Value Date/Time   NA 141 04/13/2021 1020   K 4.4 04/13/2021 1020   CL 104 04/13/2021 1020   CO2 24 04/13/2021 1020   GLUCOSE 83 04/13/2021 1020   GLUCOSE 71 12/10/2019 0939   BUN 16 04/13/2021 1020   CREATININE 0.69 04/13/2021  1020   CALCIUM 9.3 04/13/2021 1020   GFRNONAA 77 09/22/2020 1342   GFRAA 88 09/22/2020 1342    BNP No results found for: BNP  ProBNP No results found for: PROBNP  Imaging: No results found.    No flowsheet data found.  No results found for: NITRICOXIDE      Assessment & Plan:   Cough Post Covid Chronic cough -improved with treatment aimed at Chronic Rhinitis /Cough prevention   Plan  Patient Instructions  Nasal irrigation (saline nasal spray) daily Flonase 1 spray in each nostril daily Azelastine 1 spray in each nostril twice per day Chlorpheniramine 4 mg pill nightly Sip water when you have the urge to cough Salt water gargle twice per day 1 teaspoon of local honey daily Delsym 2 tsp Twice daily  As needed  cough .  Sip water when you have an urge to cough or clear your throat Albuterol 2 puffs every 6 hours as needed for cough, wheeze, or chest congestion Follow up with PCP tomorrow as planned for joint pains Follow up in 3 months with Dr. Halford Chessman or Nurse Practitioner Please contact office for sooner follow up if symptoms do not improve or worsen or seek emergency care       Allergic rhinitis Stable on present regimen  Plan  Patient Instructions  Nasal irrigation (saline nasal spray) daily Flonase 1 spray in each nostril daily Azelastine 1 spray in each nostril twice per day Chlorpheniramine 4 mg pill nightly Sip water when you have the urge to cough Salt water gargle twice per day 1 teaspoon of local honey daily Delsym 2 tsp Twice daily  As needed  cough .  Sip water when you have an urge to cough or clear your throat Albuterol 2 puffs  every 6 hours as needed for cough, wheeze, or chest congestion Follow up with PCP tomorrow as planned for joint pains Follow up in 3 months with Dr. Halford Chessman or Nurse Practitioner Please contact office for sooner follow up if symptoms do not improve or worsen or seek emergency care       Joint pain Bilateral shoulder  pain /stiffness and decreased ROM ? Etiology . Doubt from Singulair  follow up with PCP tomorrow as planned  Please contact office for sooner follow up if symptoms do not improve or worsen or seek emergency care      Rexene Edison, NP 09/24/2021

## 2021-09-24 NOTE — Assessment & Plan Note (Signed)
Stable on present regimen  Plan  Patient Instructions  Nasal irrigation (saline nasal spray) daily Flonase 1 spray in each nostril daily Azelastine 1 spray in each nostril twice per day Chlorpheniramine 4 mg pill nightly Sip water when you have the urge to cough Salt water gargle twice per day 1 teaspoon of local honey daily Delsym 2 tsp Twice daily  As needed  cough .  Sip water when you have an urge to cough or clear your throat Albuterol 2 puffs every 6 hours as needed for cough, wheeze, or chest congestion Follow up with PCP tomorrow as planned for joint pains Follow up in 3 months with Dr. Halford Chessman or Nurse Practitioner Please contact office for sooner follow up if symptoms do not improve or worsen or seek emergency care

## 2021-09-24 NOTE — Assessment & Plan Note (Signed)
Post Covid Chronic cough -improved with treatment aimed at Chronic Rhinitis /Cough prevention   Plan  Patient Instructions  Nasal irrigation (saline nasal spray) daily Flonase 1 spray in each nostril daily Azelastine 1 spray in each nostril twice per day Chlorpheniramine 4 mg pill nightly Sip water when you have the urge to cough Salt water gargle twice per day 1 teaspoon of local honey daily Delsym 2 tsp Twice daily  As needed  cough .  Sip water when you have an urge to cough or clear your throat Albuterol 2 puffs every 6 hours as needed for cough, wheeze, or chest congestion Follow up with PCP tomorrow as planned for joint pains Follow up in 3 months with Dr. Halford Chessman or Nurse Practitioner Please contact office for sooner follow up if symptoms do not improve or worsen or seek emergency care

## 2021-09-25 ENCOUNTER — Ambulatory Visit (INDEPENDENT_AMBULATORY_CARE_PROVIDER_SITE_OTHER): Payer: Medicare Other | Admitting: Nurse Practitioner

## 2021-09-25 ENCOUNTER — Other Ambulatory Visit: Payer: Self-pay

## 2021-09-25 ENCOUNTER — Encounter: Payer: Self-pay | Admitting: Nurse Practitioner

## 2021-09-25 VITALS — BP 135/70 | HR 68 | Temp 98.0°F | Wt 173.2 lb

## 2021-09-25 DIAGNOSIS — Z96611 Presence of right artificial shoulder joint: Secondary | ICD-10-CM | POA: Insufficient documentation

## 2021-09-25 DIAGNOSIS — M25511 Pain in right shoulder: Secondary | ICD-10-CM

## 2021-09-25 DIAGNOSIS — M25512 Pain in left shoulder: Secondary | ICD-10-CM | POA: Diagnosis not present

## 2021-09-25 MED ORDER — NAPROXEN 500 MG PO TABS: 500.0000 mg | ORAL_TABLET | Freq: Two times a day (BID) | ORAL | 0 refills | Status: DC

## 2021-09-25 NOTE — Progress Notes (Signed)
Acute Office Visit  Subjective:    Patient ID: Breanna Davidson, female    DOB: 1951-09-22, 70 y.o.   MRN: [id number]  Chief Complaint  Patient presents with   Pain    Patient states both her shoulders are hurting, is having difficulty sleeping and doing daily activities.     HPI Patient is in today for bilateral shoulder pain that started 10 days ago. She is having trouble putting on her bra. She had an old prescription for celebrex and has been taking that for a few days.   SHOULDER PAIN  Duration: weeks Involved shoulder: bilateral Mechanism of injury: unknown Location: diffuse Onset:gradual Severity: 9/10  Quality:  sharp and aching Frequency: constant Radiation: yes down both arms Aggravating factors: movement and sleep  Alleviating factors: ice, APAP, and NSAIDs  Status: fluctuating Treatments attempted: celebrex (old prescription), tylenol  Relief with NSAIDs?:  mild Weakness: no Numbness: no Decreased grip strength: no Redness: no Swelling: no Bruising: no Fevers: no   Past Medical History:  Diagnosis Date   Allergy    Chest pain    2017 ED VISIT FOR CHEST PAIN , R/O AS GASTRIC CAUSE    Depression    per NP notes, on cymbalta   Headache    per patient very rare sometimes when not well rested   Hyperlipidemia    Increased pressure in the eye    "THEY DID SURGERY ON MY EYES TO REDUCE THE PRESSURE , THEY MADE TWO HOLES IN MY EYES "   Osteopenia    Spinal stenosis    LUMBAR    Vertigo    IMPROVED BUT HAS OCCASIONAL SX    Past Surgical History:  Procedure Laterality Date   APPENDECTOMY     BACK SURGERY  11/2019   CESAREAN SECTION     COLONOSCOPY WITH PROPOFOL N/A 08/26/2020   Procedure: COLONOSCOPY WITH PROPOFOL;  Surgeon: Virgel Manifold, MD;  Location: [facility];  Service: Endoscopy;  Laterality: N/A;   DILATION AND CURETTAGE OF UTERUS     EYE SURGERY     "THEY DID SURGERY ON MY EYES TO REDUCE THE PRESSURE , THEY MADE TWO HOLES IN MY  EYES "   REDUCTION MAMMAPLASTY  1988   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   Procedure: TOTAL HIP ARTHROPLASTY ANTERIOR APPROACH;  Surgeon: Renette Butters, MD;  Location: [facility];  Service: Orthopedics;  Laterality: Left;    Family History  Problem Relation Age of Onset   Breast cancer Mother 77       and again at 63   Lung disease Mother    Cancer Mother    Emphysema Mother    Breast cancer Maternal Grandmother    CAD Father    Hyperlipidemia Sister    Mental illness Daughter        bipolar   Cystic fibrosis Daughter    Breast cancer Maternal Aunt     Social History   Socioeconomic History   Marital status: Married    Spouse name: Not on file   Number of children: Not on file   Years of education: college    Highest education level: Not on file  Occupational History   Occupation: retired   Tobacco Use   Smoking status: Never   Smokeless tobacco: Never  Vaping Use   Vaping Use: Never used  Substance and Sexual Activity   Alcohol use: Not Currently    Alcohol/week: 1.0 standard drink    Types: 1 Shots  of liquor per week   Drug use: No   Sexual activity: Yes  Other Topics Concern   Not on file  Social History Narrative   Not on file   Social Determinants of Health   Financial Resource Strain: Low Risk    Difficulty of Paying Living Expenses: Not hard at all  Food Insecurity: No Food Insecurity   Worried About Charity fundraiser in the Last Year: Never true   [place] in the Last Year: Never true  Transportation Needs: No Transportation Needs   Lack of Transportation (Medical): No   Lack of Transportation (Non-Medical): No  Physical Activity: Sufficiently Active   Days of Exercise per Week: 4 days   Minutes of Exercise per Session: 40 min  Stress: No Stress Concern Present   Feeling of Stress : Not at all  Social Connections: Moderately Isolated   Frequency of Communication with Friends and Family: Three times a week   Frequency of Social  Gatherings with Friends and Family: More than three times a week   Attends Religious Services: Never   Marine scientist or Organizations: No   Attends Music therapist: Never   Marital Status: Married  Human resources officer Violence: Not At Risk   Fear of Current or Ex-Partner: No   Emotionally Abused: No   Physically Abused: No   Sexually Abused: No    Outpatient Medications Prior to Visit  Medication Sig Dispense Refill   albuterol (VENTOLIN HFA) 108 (90 Base) MCG/ACT inhaler Inhale 2 puffs into the lungs every 6 (six) hours as needed for wheezing or shortness of breath. 8 g 5   azelastine (ASTELIN) 0.1 % nasal spray Place 1 spray into both nostrils 2 (two) times daily. Use in each nostril as directed 30 mL 12   CALCIUM-MAGNESIUM PO Take 1 tablet by mouth daily.     chlorpheniramine (CHLOR-TRIMETON) 4 MG tablet Take 1 tablet (4 mg total) by mouth at bedtime. 30 tablet 2   chlorpheniramine-HYDROcodone (TUSSIONEX PENNKINETIC ER) 10-8 MG/5ML SUER Take 5 mLs by mouth at bedtime as needed for cough. 70 mL 0   Cholecalciferol (VITAMIN D) 50 MCG (2000 UT) CAPS Take 2,000 Units by mouth daily.     Coenzyme Q10-Red Yeast Rice (CO Q-10 PLUS RED YEAST RICE PO) Take 1 capsule by mouth daily.     Dorzolamide HCl-Timolol Mal PF 2-0.5 % SOLN Apply 1 drop to eye daily.     fluticasone (FLONASE) 50 MCG/ACT nasal spray Place 2 sprays into both nostrils daily. 16 g 6   nystatin cream (MYCOSTATIN) Apply 1 application topically 2 (two) times daily. 30 g 1   Omega-3 Fatty Acids (FISH OIL) 1000 MG CAPS Take 1,000 mg by mouth daily.     ipratropium (ATROVENT) 0.06 % nasal spray SMARTSIG:2 Spray(s) Both Nares Twice Daily PRN     benzonatate (TESSALON) 100 MG capsule Take 1 capsule (100 mg total) by mouth 2 (two) times daily as needed for cough. (Patient not taking: Reported on 09/09/2021) 20 capsule 0   cefUROXime (CEFTIN) 500 MG tablet Take 1 tablet (500 mg total) by mouth 2 (two) times daily with  a meal. (Patient not taking: Reported on 09/25/2021) 14 tablet 0   montelukast (SINGULAIR) 10 MG tablet Take 1 tablet (10 mg total) by mouth at bedtime. (Patient not taking: Reported on 09/09/2021) 30 tablet 5   No facility-administered medications prior to visit.    Allergies  Allergen Reactions   Combigan [Brimonidine  Tartrate-Timolol] Other (See Comments)    Irritated eyes, redness and itching   Prednisone Other (See Comments)    Altered Mental Status    Review of Systems  Constitutional:  Positive for fatigue. Negative for fever.  Respiratory: Negative.    Cardiovascular: Negative.   Gastrointestinal: Negative.   Genitourinary: Negative.   Musculoskeletal:  Positive for arthralgias (bilateral shoulder pain).  Skin: Negative.   Neurological: Negative.   Psychiatric/Behavioral: Negative.        Objective:    Physical Exam Vitals and nursing note reviewed.  Constitutional:      General: She is not in acute distress.    Appearance: Normal appearance.  HENT:     Head: Normocephalic.  Eyes:     Conjunctiva/sclera: Conjunctivae normal.  Cardiovascular:     Rate and Rhythm: Normal rate and regular rhythm.     Pulses: Normal pulses.     Heart sounds: Normal heart sounds.  Pulmonary:     Effort: Pulmonary effort is normal.     Breath sounds: Normal breath sounds.  Musculoskeletal:        General: No swelling or tenderness.     Cervical back: Normal range of motion.     Comments: Unable to fully abduct arms past 90 degrees when held out to the side. Can't reach left hand behind her back  Skin:    General: Skin is warm.  Neurological:     General: No focal deficit present.     Mental Status: She is alert and oriented to person, place, and time.  Psychiatric:        Mood and Affect: Mood normal.        Behavior: Behavior normal.        Thought Content: Thought content normal.        Judgment: Judgment normal.    BP 135/70   Pulse 68   Temp 98 F (36.7 C)   Wt 173  lb 3.2 oz (78.6 kg)   LMP  (LMP Unknown)   SpO2 99%   BMI 31.68 kg/m  Wt Readings from Last 3 Encounters:  09/25/21 173 lb 3.2 oz (78.6 kg)  09/24/21 173 lb 3.2 oz (78.6 kg)  08/27/21 174 lb 9.6 oz (79.2 kg)    Health Maintenance Due  Topic Date Due   Zoster Vaccines- Shingrix (1 of 2) Never done    There are no preventive care reminders to display for this patient.   Lab Results  Component Value Date   TSH 2.270 09/22/2020   Lab Results  Component Value Date   WBC 8.0 04/13/2021   HGB 12.4 04/13/2021   HCT 37.8 04/13/2021   MCV 90 04/13/2021   PLT 277 04/13/2021   Lab Results  Component Value Date   NA 141 04/13/2021   K 4.4 04/13/2021   CO2 24 04/13/2021   GLUCOSE 83 04/13/2021   BUN 16 04/13/2021   CREATININE 0.69 04/13/2021   BILITOT 0.3 04/13/2021   ALKPHOS 60 04/13/2021   AST 17 04/13/2021   ALT 18 04/13/2021   PROT 6.2 04/13/2021   ALBUMIN 4.1 04/13/2021   CALCIUM 9.3 04/13/2021   ANIONGAP 10 12/10/2019   EGFR 93 04/13/2021   Lab Results  Component Value Date   CHOL 239 (H) 04/13/2021   Lab Results  Component Value Date   HDL 72 04/13/2021   Lab Results  Component Value Date   LDLCALC 150 (H) 04/13/2021   Lab Results  Component Value Date   TRIG  96 04/13/2021   Lab Results  Component Value Date   CHOLHDL 3.2 08/01/2018   Lab Results  Component Value Date   HGBA1C 5.4 05/31/2019       Assessment & Plan:   Problem List Items Addressed This Visit       Other   Acute pain of both shoulders - Primary    Acute severe pain to bilateral shoulders with limited range of motion. No pain and normal ROM to cervical spine. Differentials include arthritis, PMR, tendonitis. Will check CRP, ESR, ANA, RF, anti CCP, CBC, CMP, TSH, and CK. Continue tylenol and heat as needed for pain. She has allergy to prednisone. Will start naproxen twice a day with food. Continue gentle stretches. If labs normal, consider imaging and physical therapy. Follow up  in 1 week.       Relevant Orders   CBC with Differential/Platelet   Comprehensive metabolic panel   ANA w/Reflex if Positive   Rheumatoid Factor   Anti-CCP Ab, IgG + IgA (RDL)   Sed Rate (ESR)   C-reactive protein   TSH   CK (Creatine Kinase)     Meds ordered this encounter  Medications   naproxen (NAPROSYN) 500 MG tablet    Sig: Take 1 tablet (500 mg total) by mouth 2 (two) times daily with a meal.    Dispense:  30 tablet    Refill:  0     Charyl Dancer, NP

## 2021-09-25 NOTE — Assessment & Plan Note (Signed)
Acute severe pain to bilateral shoulders with limited range of motion. No pain and normal ROM to cervical spine. Differentials include arthritis, PMR, tendonitis. Will check CRP, ESR, ANA, RF, anti CCP, CBC, CMP, TSH, and CK. Continue tylenol and heat as needed for pain. She has allergy to prednisone. Will start naproxen twice a day with food. Continue gentle stretches. If labs normal, consider imaging and physical therapy. Follow up in 1 week.  

## 2021-09-25 NOTE — Patient Instructions (Addendum)
Start naproxen twice a day with food Can take tylenol as needed Keep doing your stretches You can also use heat or ice Follow up in 1 week

## 2021-09-26 LAB — CBC WITH DIFFERENTIAL/PLATELET
Basophils Absolute: 0.1 10*3/uL (ref 0.0–0.2)
Basos: 1 %
EOS (ABSOLUTE): 0.3 10*3/uL (ref 0.0–0.4)
Eos: 3 %
Hematocrit: 38.3 % (ref 34.0–46.6)
Hemoglobin: 12.2 g/dL (ref 11.1–15.9)
Immature Grans (Abs): 0 10*3/uL (ref 0.0–0.1)
Immature Granulocytes: 0 %
Lymphocytes Absolute: 1.9 10*3/uL (ref 0.7–3.1)
Lymphs: 22 %
MCH: 28.4 pg (ref 26.6–33.0)
MCHC: 31.9 g/dL (ref 31.5–35.7)
MCV: 89 fL (ref 79–97)
Monocytes Absolute: 0.8 10*3/uL (ref 0.1–0.9)
Monocytes: 10 %
Neutrophils Absolute: 5.3 10*3/uL (ref 1.4–7.0)
Neutrophils: 64 %
Platelets: 385 10*3/uL (ref 150–450)
RBC: 4.29 x10E6/uL (ref 3.77–5.28)
RDW: 12.5 % (ref 11.7–15.4)
WBC: 8.3 10*3/uL (ref 3.4–10.8)

## 2021-09-26 LAB — CK: Total CK: 48 U/L (ref 32–182)

## 2021-09-26 LAB — COMPREHENSIVE METABOLIC PANEL
ALT: 68 IU/L — ABNORMAL HIGH (ref 0–32)
AST: 47 IU/L — ABNORMAL HIGH (ref 0–40)
Albumin/Globulin Ratio: 1.5 (ref 1.2–2.2)
Albumin: 3.8 g/dL (ref 3.8–4.8)
Alkaline Phosphatase: 123 IU/L — ABNORMAL HIGH (ref 44–121)
BUN/Creatinine Ratio: 21 (ref 12–28)
BUN: 16 mg/dL (ref 8–27)
Bilirubin Total: 0.3 mg/dL (ref 0.0–1.2)
CO2: 25 mmol/L (ref 20–29)
Calcium: 9.3 mg/dL (ref 8.7–10.3)
Chloride: 104 mmol/L (ref 96–106)
Creatinine, Ser: 0.77 mg/dL (ref 0.57–1.00)
Globulin, Total: 2.5 g/dL (ref 1.5–4.5)
Glucose: 74 mg/dL (ref 70–99)
Potassium: 4.6 mmol/L (ref 3.5–5.2)
Sodium: 142 mmol/L (ref 134–144)
Total Protein: 6.3 g/dL (ref 6.0–8.5)
eGFR: 83 mL/min/{1.73_m2} (ref >59)

## 2021-09-26 LAB — RHEUMATOID FACTOR: Rheumatoid fact SerPl-aCnc: <10 IU/mL (ref <14.0)

## 2021-09-26 LAB — ANA W/REFLEX IF POSITIVE: Anti Nuclear Antibody (ANA): NEGATIVE

## 2021-09-26 LAB — C-REACTIVE PROTEIN: CRP: 20 mg/L — ABNORMAL HIGH (ref 0–10)

## 2021-09-26 LAB — SEDIMENTATION RATE: Sed Rate: 18 mm/hr (ref 0–40)

## 2021-09-26 LAB — TSH: TSH: 1.99 u[IU]/mL (ref 0.450–4.500)

## 2021-10-01 NOTE — Progress Notes (Signed)
Established Patient Office Visit  Subjective:  Patient ID: Breanna Davidson, female    DOB: Jan 17, 1951  Age: 70 y.o. MRN: [id number]  CC:  Chief Complaint  Patient presents with   Shoulder Pain    Bilateral, left sometimes worse than right. Naproxen helps some for a few hours, then the pain comes back. Still having trouble sleeping and getting dressed.      HPI Breanna Davidson presents for follow up on bilateral arm and shoulder pain. She states it is a 8/10 today feels like a squeezing and tingling pain. She can move her arms in front of her, but is still having trouble reaching behind her and moving her hands over head. She has started the naproxen which helps with the pain for a short period of time. She has also been using heat and doing daily stretches.    Past Medical History:  Diagnosis Date   Allergy    Chest pain    2017 ED VISIT FOR CHEST PAIN , R/O AS GASTRIC CAUSE    Depression    per NP notes, on cymbalta   Headache    per patient very rare sometimes when not well rested   Hyperlipidemia    Increased pressure in the eye    "THEY DID SURGERY ON MY EYES TO REDUCE THE PRESSURE , THEY MADE TWO HOLES IN MY EYES "   Osteopenia    Spinal stenosis    LUMBAR    Vertigo    IMPROVED BUT HAS OCCASIONAL SX    Past Surgical History:  Procedure Laterality Date   APPENDECTOMY     BACK SURGERY  11/2019   CESAREAN SECTION     COLONOSCOPY WITH PROPOFOL N/A 08/26/2020   Procedure: COLONOSCOPY WITH PROPOFOL;  Surgeon: Virgel Manifold, MD;  Location: [facility];  Service: Endoscopy;  Laterality: N/A;   DILATION AND CURETTAGE OF UTERUS     EYE SURGERY     "THEY DID SURGERY ON MY EYES TO REDUCE THE PRESSURE , THEY MADE TWO HOLES IN MY EYES "   REDUCTION MAMMAPLASTY  1988   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   Procedure: TOTAL HIP ARTHROPLASTY ANTERIOR APPROACH;  Surgeon: Renette Butters, MD;  Location: [facility];  Service: Orthopedics;  Laterality: Left;    Family  History  Problem Relation Age of Onset   Breast cancer Mother 7       and again at 22   Lung disease Mother    Cancer Mother    Emphysema Mother    Breast cancer Maternal Grandmother    CAD Father    Hyperlipidemia Sister    Mental illness Daughter        bipolar   Cystic fibrosis Daughter    Breast cancer Maternal Aunt     Social History   Socioeconomic History   Marital status: Married    Spouse name: Not on file   Number of children: Not on file   Years of education: college    Highest education level: Not on file  Occupational History   Occupation: retired   Tobacco Use   Smoking status: Never   Smokeless tobacco: Never  Vaping Use   Vaping Use: Never used  Substance and Sexual Activity   Alcohol use: Not Currently    Alcohol/week: 1.0 standard drink    Types: 1 Shots of liquor per week   Drug use: No   Sexual activity: Yes  Other Topics Concern   Not on  file  Social History Narrative   Not on file   Social Determinants of Health   Financial Resource Strain: Low Risk    Difficulty of Paying Living Expenses: Not hard at all  Food Insecurity: No Food Insecurity   Worried About Charity fundraiser in the Last Year: Never true   Arboriculturist in the Last Year: Never true  Transportation Needs: No Transportation Needs   Lack of Transportation (Medical): No   Lack of Transportation (Non-Medical): No  Physical Activity: Sufficiently Active   Days of Exercise per Week: 4 days   Minutes of Exercise per Session: 40 min  Stress: No Stress Concern Present   Feeling of Stress : Not at all  Social Connections: Moderately Isolated   Frequency of Communication with Friends and Family: Three times a week   Frequency of Social Gatherings with Friends and Family: More than three times a week   Attends Religious Services: Never   Marine scientist or Organizations: No   Attends Music therapist: Never   Marital Status: Married  Human resources officer  Violence: Not At Risk   Fear of Current or Ex-Partner: No   Emotionally Abused: No   Physically Abused: No   Sexually Abused: No    Outpatient Medications Prior to Visit  Medication Sig Dispense Refill   albuterol (VENTOLIN HFA) 108 (90 Base) MCG/ACT inhaler Inhale 2 puffs into the lungs every 6 (six) hours as needed for wheezing or shortness of breath. 8 g 5   azelastine (ASTELIN) 0.1 % nasal spray Place 1 spray into both nostrils 2 (two) times daily. Use in each nostril as directed 30 mL 12   CALCIUM-MAGNESIUM PO Take 1 tablet by mouth daily.     chlorpheniramine (CHLOR-TRIMETON) 4 MG tablet Take 1 tablet (4 mg total) by mouth at bedtime. 30 tablet 2   chlorpheniramine-HYDROcodone (TUSSIONEX PENNKINETIC ER) 10-8 MG/5ML SUER Take 5 mLs by mouth at bedtime as needed for cough. 70 mL 0   Cholecalciferol (VITAMIN D) 50 MCG (2000 UT) CAPS Take 2,000 Units by mouth daily.     Coenzyme Q10-Red Yeast Rice (CO Q-10 PLUS RED YEAST RICE PO) Take 1 capsule by mouth daily.     Dorzolamide HCl-Timolol Mal PF 2-0.5 % SOLN Apply 1 drop to eye daily.     fluticasone (FLONASE) 50 MCG/ACT nasal spray Place 2 sprays into both nostrils daily. 16 g 6   ipratropium (ATROVENT) 0.06 % nasal spray SMARTSIG:2 Spray(s) Both Nares Twice Daily PRN     naproxen (NAPROSYN) 500 MG tablet Take 1 tablet (500 mg total) by mouth 2 (two) times daily with a meal. 30 tablet 0   nystatin cream (MYCOSTATIN) Apply 1 application topically 2 (two) times daily. 30 g 1   Omega-3 Fatty Acids (FISH OIL) 1000 MG CAPS Take 1,000 mg by mouth daily.     No facility-administered medications prior to visit.    Allergies  Allergen Reactions   Combigan [Brimonidine Tartrate-Timolol] Other (See Comments)    Irritated eyes, redness and itching   Prednisone Other (See Comments)    Altered Mental Status    ROS Review of Systems  Constitutional:  Positive for fatigue.  Respiratory: Negative.    Cardiovascular: Negative.    Gastrointestinal: Negative.   Genitourinary: Negative.   Musculoskeletal:  Positive for arthralgias.  Skin: Negative.   Neurological: Negative.      Objective:    Physical Exam Vitals and nursing note reviewed.  Constitutional:  General: She is not in acute distress.    Appearance: Normal appearance.  HENT:     Head: Normocephalic and atraumatic.  Eyes:     Conjunctiva/sclera: Conjunctivae normal.  Cardiovascular:     Rate and Rhythm: Normal rate.     Pulses: Normal pulses.  Pulmonary:     Effort: Pulmonary effort is normal.  Musculoskeletal:        General: No tenderness.     Cervical back: Normal range of motion.     Comments: Limited ROM in bilateral shoulders due to pain  Skin:    General: Skin is warm and dry.  Neurological:     General: No focal deficit present.     Mental Status: She is alert and oriented to person, place, and time.  Psychiatric:        Mood and Affect: Mood normal.        Behavior: Behavior normal.    BP 120/72 (BP Location: Left Arm, Patient Position: Sitting)   Pulse 72   Temp 98.4 F (36.9 C) (Oral)   Resp 18   LMP  (LMP Unknown)   SpO2 98%  Wt Readings from Last 3 Encounters:  09/25/21 173 lb 3.2 oz (78.6 kg)  09/24/21 173 lb 3.2 oz (78.6 kg)  08/27/21 174 lb 9.6 oz (79.2 kg)     Health Maintenance Due  Topic Date Due   Zoster Vaccines- Shingrix (1 of 2) Never done    There are no preventive care reminders to display for this patient.  Lab Results  Component Value Date   TSH 1.990 09/25/2021   Lab Results  Component Value Date   WBC 8.3 09/25/2021   HGB 12.2 09/25/2021   HCT 38.3 09/25/2021   MCV 89 09/25/2021   PLT 385 09/25/2021   Lab Results  Component Value Date   NA 142 09/25/2021   K 4.6 09/25/2021   CO2 25 09/25/2021   GLUCOSE 74 09/25/2021   BUN 16 09/25/2021   CREATININE 0.77 09/25/2021   BILITOT 0.3 09/25/2021   ALKPHOS 123 (H) 09/25/2021   AST 47 (H) 09/25/2021   ALT 68 (H) 09/25/2021    PROT 6.3 09/25/2021   ALBUMIN 3.8 09/25/2021   CALCIUM 9.3 09/25/2021   ANIONGAP 10 12/10/2019   EGFR 83 09/25/2021   Lab Results  Component Value Date   CHOL 239 (H) 04/13/2021   Lab Results  Component Value Date   HDL 72 04/13/2021   Lab Results  Component Value Date   LDLCALC 150 (H) 04/13/2021   Lab Results  Component Value Date   TRIG 96 04/13/2021   Lab Results  Component Value Date   CHOLHDL 3.2 08/01/2018   Lab Results  Component Value Date   HGBA1C 5.4 05/31/2019      Assessment & Plan:   Problem List Items Addressed This Visit       Other   Acute pain of both shoulders - Primary    Acute, ongoing. Will check x-rays of bilateral shoulders and cervical spine. She can continue naproxen. Start tizandine prn and gabapentin at bedtime. Referral placed for physical therapy. Consider referral to orthopedics if ongoing. ANA, RF, and sed rate were negative, CRP was slightly elevated. Follow up in 3-4 weeks.       Relevant Orders   Ambulatory referral to Physical Therapy   DG Shoulder Right   DG Shoulder Left   DG Cervical Spine Complete    Meds ordered this encounter  Medications  tiZANidine (ZANAFLEX) 4 MG tablet    Sig: Take 1 tablet (4 mg total) by mouth every 6 (six) hours as needed for muscle spasms.    Dispense:  30 tablet    Refill:  0   gabapentin (NEURONTIN) 300 MG capsule    Sig: Take 1 capsule (300 mg total) by mouth at bedtime.    Dispense:  30 capsule    Refill:  0     Follow-up: Return in about 3 weeks (around 10/23/2021) for Bilateral shoulder pain.    Charyl Dancer, NP

## 2021-10-02 ENCOUNTER — Ambulatory Visit
Admission: RE | Admit: 2021-10-02 | Discharge: 2021-10-02 | Disposition: A | Discharge status: Home / Self Care | Payer: Medicare Other | Source: Ambulatory Visit | Attending: Nurse Practitioner | Admitting: Nurse Practitioner

## 2021-10-02 ENCOUNTER — Encounter: Payer: Self-pay | Admitting: Nurse Practitioner

## 2021-10-02 ENCOUNTER — Ambulatory Visit (INDEPENDENT_AMBULATORY_CARE_PROVIDER_SITE_OTHER): Payer: Medicare Other | Admitting: Nurse Practitioner

## 2021-10-02 ENCOUNTER — Ambulatory Visit
Admission: RE | Admit: 2021-10-02 | Discharge: 2021-10-02 | Disposition: A | Discharge status: Home / Self Care | Payer: Medicare Other | Attending: Nurse Practitioner | Admitting: Nurse Practitioner

## 2021-10-02 ENCOUNTER — Other Ambulatory Visit: Payer: Self-pay

## 2021-10-02 VITALS — BP 120/72 | HR 72 | Temp 98.4°F | Resp 18

## 2021-10-02 DIAGNOSIS — M25511 Pain in right shoulder: Secondary | ICD-10-CM

## 2021-10-02 DIAGNOSIS — M47812 Spondylosis without myelopathy or radiculopathy, cervical region: Secondary | ICD-10-CM | POA: Diagnosis not present

## 2021-10-02 DIAGNOSIS — M25512 Pain in left shoulder: Secondary | ICD-10-CM | POA: Diagnosis not present

## 2021-10-02 MED ORDER — TIZANIDINE HCL 4 MG PO TABS: 4.0000 mg | ORAL_TABLET | Freq: Four times a day (QID) | ORAL | 0 refills | Status: DC | PRN

## 2021-10-02 MED ORDER — GABAPENTIN 300 MG PO CAPS: 300.0000 mg | ORAL_CAPSULE | Freq: Every day | ORAL | 0 refills | Status: DC

## 2021-10-02 NOTE — Patient Instructions (Signed)
[facility] [address] [place] [place], [place] [address] [phone]

## 2021-10-02 NOTE — Assessment & Plan Note (Signed)
Acute, ongoing. Will check x-rays of bilateral shoulders and cervical spine. She can continue naproxen. Start tizandine prn and gabapentin at bedtime. Referral placed for physical therapy. Consider referral to orthopedics if ongoing. ANA, RF, and sed rate were negative, CRP was slightly elevated. Follow up in 3-4 weeks.

## 2021-10-04 LAB — ANTI-CCP AB, IGG + IGA (RDL): Anti-CCP Ab, IgG + IgA (RDL): <20 Units (ref <20)

## 2021-10-13 ENCOUNTER — Other Ambulatory Visit: Payer: Self-pay

## 2021-10-13 ENCOUNTER — Other Ambulatory Visit: Payer: Self-pay | Admitting: Nurse Practitioner

## 2021-10-13 ENCOUNTER — Encounter: Payer: Self-pay | Admitting: Nurse Practitioner

## 2021-10-13 ENCOUNTER — Ambulatory Visit (INDEPENDENT_AMBULATORY_CARE_PROVIDER_SITE_OTHER): Payer: Medicare Other | Admitting: Nurse Practitioner

## 2021-10-13 VITALS — BP 99/64 | HR 67 | Temp 98.6°F | Ht 62.0 in | Wt 171.0 lb

## 2021-10-13 DIAGNOSIS — E538 Deficiency of other specified B group vitamins: Secondary | ICD-10-CM

## 2021-10-13 DIAGNOSIS — M4722 Other spondylosis with radiculopathy, cervical region: Secondary | ICD-10-CM | POA: Diagnosis not present

## 2021-10-13 DIAGNOSIS — F330 Major depressive disorder, recurrent, mild: Secondary | ICD-10-CM

## 2021-10-13 DIAGNOSIS — E7800 Pure hypercholesterolemia, unspecified: Secondary | ICD-10-CM

## 2021-10-13 DIAGNOSIS — M25511 Pain in right shoulder: Secondary | ICD-10-CM

## 2021-10-13 DIAGNOSIS — M8588 Other specified disorders of bone density and structure, other site: Secondary | ICD-10-CM | POA: Diagnosis not present

## 2021-10-13 DIAGNOSIS — M5412 Radiculopathy, cervical region: Secondary | ICD-10-CM | POA: Diagnosis not present

## 2021-10-13 DIAGNOSIS — M25512 Pain in left shoulder: Secondary | ICD-10-CM

## 2021-10-13 DIAGNOSIS — M542 Cervicalgia: Secondary | ICD-10-CM | POA: Diagnosis not present

## 2021-10-13 DIAGNOSIS — M47892 Other spondylosis, cervical region: Secondary | ICD-10-CM | POA: Diagnosis not present

## 2021-10-13 DIAGNOSIS — Z6832 Body mass index (BMI) 32.0-32.9, adult: Secondary | ICD-10-CM

## 2021-10-13 DIAGNOSIS — E6609 Other obesity due to excess calories: Secondary | ICD-10-CM

## 2021-10-13 DIAGNOSIS — Z1231 Encounter for screening mammogram for malignant neoplasm of breast: Secondary | ICD-10-CM

## 2021-10-13 MED ORDER — CELECOXIB 50 MG PO CAPS: 50.0000 mg | ORAL_CAPSULE | Freq: Two times a day (BID) | ORAL | 1 refills | Status: DC

## 2021-10-13 NOTE — Assessment & Plan Note (Signed)
BMI 31.28.  Recommended eating smaller high protein, low fat meals more frequently and exercising 30 mins a day 5 times a week with a goal of 10-15lb weight loss in the next 3 months. Patient voiced their understanding and motivation to adhere to these recommendations.

## 2021-10-13 NOTE — Assessment & Plan Note (Signed)
Ordered repeat DEXA, recommend she schedule to further assess.  Continue Vitamin D daily and check level today.

## 2021-10-13 NOTE — Assessment & Plan Note (Signed)
Chronic, aortic atherosclerosis noted on lumbar imaging. She is hesitant to start statins and is currently focusing on diet, exercise, and taking an omega 3 supplement. Will check lipid panel today.  ASCVD 5.9%.

## 2021-10-13 NOTE — Progress Notes (Signed)
BP 99/64    Pulse 67    Temp 98.6 F (37 C) (Oral)    Ht '5\' 2"'  (1.575 m)    Wt 171 lb (77.6 kg)    LMP  (LMP Unknown)    SpO2 97%    BMI 31.28 kg/m    Subjective:    Patient ID: Breanna Davidson, female    DOB: June 23, 1951, 70 y.o.   MRN: [id number]  HPI: [name] is a 70 y.o. female  Chief Complaint  Patient presents with   Hyperlipidemia   Osteopenia   Shoulder Pain    Patient states she is still experiencing shoulder pain. Patient states in the morning she can't dress herself or drive. Patient states when she takes the medication she feels a little better, but after the medication wears off she can't stop the pain.    SHOULDER PAIN Ongoing bilateral shoulder pain -- started 3 weeks ago.  R>L, started after she was doing a painting + had a cough for several weeks. Had imaging on 10/02/21 = no arthritic changes in shoulders, neck noted cervical spondylosis.  She starts PT this afternoon at [facility] in [place]. Duration: weeks Involved shoulder: bilateral Mechanism of injury: unknown Location: diffuse Onset:sudden Severity: 8/10  Quality:  dull and aching Frequency: intermittent Radiation: no Aggravating factors: lifting, movement, and throwing  Alleviating factors:  Celebrex   Status: fluctuating Treatments attempted:  Gabapentin, Celebrex, Tizanidine, Tylenol   Relief with NSAIDs?:  mild Weakness: yes Numbness: no Decreased grip strength: no Redness: no Swelling: no Bruising: no Fevers: no   HYPERLIPIDEMIA Last LDL 150 in June. Hyperlipidemia status: good compliance Supplements: fish oil and red yeast rice Aspirin:  no The 10-year ASCVD risk score (Arnett DK, et al., 2019) is: 5.9%   Values used to calculate the score:     Age: 13 years     Sex: Female     Is Non-Hispanic African American: No     Diabetic: No     Tobacco smoker: No     Systolic Blood Pressure: 99 mmHg     Is BP treated: No     HDL Cholesterol: 72 mg/dL     Total Cholesterol: 239  mg/dL Chest pain:  no Coronary artery disease:  no Family history CAD:  no Family history early CAD:  no   OSTEOPENIA Noted on September 2017 scan. Satisfied with current treatment?: yes Adequate calcium & vitamin D: yes Intolerance to bisphosphonates:unknown Weight bearing exercises: yes   DEPRESSION No medications at this time, previously took Celexa. Mood status: stable Satisfied with current treatment?: yes Symptom severity: mild  Duration of current treatment : chronic Side effects: no Medication compliance: good compliance Psychotherapy/counseling: none Depressed mood: no Anxious mood: no Anhedonia: no Significant weight loss or gain: no Insomnia: none Fatigue: no Feelings of worthlessness or guilt: no Impaired concentration/indecisiveness: no Suicidal ideations: no Hopelessness: no Crying spells: no Depression screen [facility] 2/9 10/13/2021 09/09/2021 08/03/2021 04/13/2021 11/25/2020  Decreased Interest 0 0 0 0 0  Down, Depressed, Hopeless 0 0 0 0 0  PHQ - 2 Score 0 0 0 0 0  Altered sleeping 0 0 0 0 0  Tired, decreased energy 0 0 0 3 0  Change in appetite 0 0 0 1 0  Feeling bad or failure about yourself  0 0 0 0 0  Trouble concentrating 0 0 0 0 0  Moving slowly or fidgety/restless 0 0 0 0 0  Suicidal thoughts 0 0 0 0  0  PHQ-9 Score 0 0 0 4 0  Difficult doing work/chores Not difficult at all Not difficult at all Not difficult at all Not difficult at all -  Some recent data might be hidden     Relevant past medical, surgical, family and social history reviewed and updated as indicated. Interim medical history since our last visit reviewed. Allergies and medications reviewed and updated.  Review of Systems  Constitutional:  Negative for activity change, appetite change, diaphoresis, fatigue and fever.  Respiratory:  Negative for cough, chest tightness and shortness of breath.   Cardiovascular:  Negative for chest pain, palpitations and leg swelling.   Gastrointestinal: Negative.   Musculoskeletal:  Positive for arthralgias and neck stiffness.  Neurological: Negative.   Psychiatric/Behavioral: Negative.     Per HPI unless specifically indicated above     Objective:    BP 99/64    Pulse 67    Temp 98.6 F (37 C) (Oral)    Ht '5\' 2"'  (1.575 m)    Wt 171 lb (77.6 kg)    LMP  (LMP Unknown)    SpO2 97%    BMI 31.28 kg/m   Wt Readings from Last 3 Encounters:  10/13/21 171 lb (77.6 kg)  09/25/21 173 lb 3.2 oz (78.6 kg)  09/24/21 173 lb 3.2 oz (78.6 kg)    Physical Exam Vitals and nursing note reviewed.  Constitutional:      General: She is awake. She is not in acute distress.    Appearance: She is well-developed and well-groomed. She is obese. She is not ill-appearing or toxic-appearing.  HENT:     Head: Normocephalic.     Right Ear: Hearing, ear canal and external ear normal.     Left Ear: Hearing, ear canal and external ear normal.  Eyes:     General: Lids are normal.        Right eye: No discharge.        Left eye: No discharge.     Conjunctiva/sclera: Conjunctivae normal.     Pupils: Pupils are equal, round, and reactive to light.  Neck:     Thyroid: No thyromegaly.     Vascular: No carotid bruit.     Comments: Muscle tension noted throughout all posterior neck area. Cardiovascular:     Rate and Rhythm: Normal rate and regular rhythm.     Heart sounds: Normal heart sounds. No murmur heard.   No gallop.  Pulmonary:     Effort: Pulmonary effort is normal. No accessory muscle usage or respiratory distress.     Breath sounds: Normal breath sounds.  Abdominal:     General: Bowel sounds are normal.     Palpations: Abdomen is soft.  Musculoskeletal:     Right shoulder: No swelling, effusion, tenderness, bony tenderness or crepitus. Decreased range of motion. Decreased strength. Normal pulse.     Left shoulder: No swelling, effusion, tenderness, bony tenderness or crepitus. Decreased range of motion. Decreased strength. Normal  pulse.     Cervical back: Neck supple. No rigidity. Muscular tenderness present. No pain with movement or spinous process tenderness. Normal range of motion.     Right lower leg: No edema.     Left lower leg: No edema.  Lymphadenopathy:     Cervical: No cervical adenopathy.  Skin:    General: Skin is warm and dry.  Neurological:     Mental Status: She is alert and oriented to person, place, and time.  Psychiatric:  Attention and Perception: Attention normal.        Mood and Affect: Mood normal.        Behavior: Behavior normal. Behavior is cooperative.        Thought Content: Thought content normal.        Judgment: Judgment normal.    Results for orders placed or performed in visit on 09/25/21  CBC with Differential/Platelet  Result Value Ref Range   WBC 8.3 3.4 - 10.8 x10E3/uL   RBC 4.29 3.77 - 5.28 x10E6/uL   Hemoglobin 12.2 11.1 - 15.9 g/dL   Hematocrit 38.3 34.0 - 46.6 %   MCV 89 79 - 97 fL   MCH 28.4 26.6 - 33.0 pg   MCHC 31.9 31.5 - 35.7 g/dL   RDW 12.5 11.7 - 15.4 %   Platelets 385 150 - 450 x10E3/uL   Neutrophils 64 Not Estab. %   Lymphs 22 Not Estab. %   Monocytes 10 Not Estab. %   Eos 3 Not Estab. %   Basos 1 Not Estab. %   Neutrophils Absolute 5.3 1.4 - 7.0 x10E3/uL   Lymphocytes Absolute 1.9 0.7 - 3.1 x10E3/uL   Monocytes Absolute 0.8 0.1 - 0.9 x10E3/uL   EOS (ABSOLUTE) 0.3 0.0 - 0.4 x10E3/uL   Basophils Absolute 0.1 0.0 - 0.2 x10E3/uL   Immature Granulocytes 0 Not Estab. %   Immature Grans (Abs) 0.0 0.0 - 0.1 x10E3/uL  Comprehensive metabolic panel  Result Value Ref Range   Glucose 74 70 - 99 mg/dL   BUN 16 8 - 27 mg/dL   Creatinine, Ser 0.77 0.57 - 1.00 mg/dL   eGFR 83 >59 mL/min/1.73   BUN/Creatinine Ratio 21 12 - 28   Sodium 142 134 - 144 mmol/L   Potassium 4.6 3.5 - 5.2 mmol/L   Chloride 104 96 - 106 mmol/L   CO2 25 20 - 29 mmol/L   Calcium 9.3 8.7 - 10.3 mg/dL   Total Protein 6.3 6.0 - 8.5 g/dL   Albumin 3.8 3.8 - 4.8 g/dL   Globulin,  Total 2.5 1.5 - 4.5 g/dL   Albumin/Globulin Ratio 1.5 1.2 - 2.2   Bilirubin Total 0.3 0.0 - 1.2 mg/dL   Alkaline Phosphatase 123 (H) 44 - 121 IU/L   AST 47 (H) 0 - 40 IU/L   ALT 68 (H) 0 - 32 IU/L  ANA w/Reflex if Positive  Result Value Ref Range   Anti Nuclear Antibody (ANA) Negative Negative  Rheumatoid Factor  Result Value Ref Range   Rhuematoid fact SerPl-aCnc <10.0 <14.0 IU/mL  Anti-CCP Ab, IgG + IgA (RDL)  Result Value Ref Range   Anti-CCP Ab, IgG + IgA (RDL) <20 <20 Units  Sed Rate (ESR)  Result Value Ref Range   Sed Rate 18 0 - 40 mm/hr  C-reactive protein  Result Value Ref Range   CRP 20 (H) 0 - 10 mg/L  TSH  Result Value Ref Range   TSH 1.990 0.450 - 4.500 uIU/mL  CK (Creatine Kinase)  Result Value Ref Range   Total CK 48 32 - 182 U/L      Assessment & Plan:   Problem List Items Addressed This Visit       Nervous and Auditory   Cervical spondylosis with radiculopathy    Referral to neurosurgery for further recommendations as is affecting shoulders bilaterally.      Relevant Medications   celecoxib (CELEBREX) 50 MG capsule   Other Relevant Orders   Ambulatory referral to Neurosurgery  Musculoskeletal and Integument   Osteopenia    Ordered repeat DEXA, recommend she schedule to further assess.  Continue Vitamin D daily and check level today.      Relevant Orders   TSH   VITAMIN D 25 Hydroxy (Vit-D Deficiency, Fractures)     Other   Acute pain of both shoulders    Ongoing, imaging showed cervical spondylosis.  On exam suspect shoulder pain originating from neck area.  She starts PT this afternoon -- continue this.  Will place referral to Dr. Christella Noa with neurosurgery to further advise, suspect some impingement presenting from neck area.  Celebrex script sent to start at this time, educated her on this.      Depression - Primary    Stable at this time without medication.  Denies SI/HI.  Continue current plan and restart medication as needed.       Hypercholesteremia    Chronic, aortic atherosclerosis noted on lumbar imaging. She is hesitant to start statins and is currently focusing on diet, exercise, and taking an omega 3 supplement. Will check lipid panel today.  ASCVD 5.9%.      Relevant Orders   Lipid Panel w/o Chol/HDL Ratio   Obesity    BMI 31.28.  Recommended eating smaller high protein, low fat meals more frequently and exercising 30 mins a day 5 times a week with a goal of 10-15lb weight loss in the next 3 months. Patient voiced their understanding and motivation to adhere to these recommendations.       Other Visit Diagnoses     B12 deficiency       History of low levels reported, recheck today and start supplement as needed.   Relevant Orders   Vitamin B12        Follow up plan: Return in about 6 weeks (around 11/24/2021) for Neck Pain.

## 2021-10-13 NOTE — Assessment & Plan Note (Signed)
Referral to neurosurgery for further recommendations as is affecting shoulders bilaterally.

## 2021-10-13 NOTE — Patient Instructions (Signed)
Cervical Radiculopathy Cervical radiculopathy happens when a nerve in the neck (a cervical nerve) is pinched or bruised. This condition can happen because of an injury to the cervical spine (vertebrae) in the neck, or as part of the normal aging process. Pressure on the cervical nerves can cause pain or numbness that travels from the neck all the way down to the arm and fingers. This condition usually gets better with rest. Treatment may be needed if the condition does not improve. What are the causes? This condition may be caused by: A neck injury. A bulging (herniated) disk. Muscle spasms. Muscle tightness in the neck due to overuse. Arthritis. Breakdown or degeneration in the bones and joints of the spine (spondylosis) due to aging. Bone spurs that may develop near the cervical nerves. What are the signs or symptoms? Symptoms of this condition include: Pain. The pain may travel from the neck to the arm and hand. The pain can be severe or irritating. It may get worse when you move your neck. Numbness or tingling in your arm or hand. Weakness in the affected arm and hand, in severe cases. How is this diagnosed? This condition may be diagnosed based on your symptoms, your medical history, and a physical exam. You may also have tests, including: X-rays. CT scan. MRI. Electromyogram (EMG). Nerve conduction tests. How is this treated? In many cases, treatment is not needed for this condition. With rest, the condition usually gets better over time. If treatment is needed, options may include: Wearing a soft neck collar (cervical collar) for short periods of time. Doing physical therapy to strengthen your neck muscles. Taking medicines. These may include NSAIDs, such as ibuprofen, or oral corticosteroids. Having spinal injections, in severe cases. Having surgery. This may be needed if other treatments do not help. Different types of surgery may be done depending on the cause of this  condition. Follow these instructions at home: If you have a cervical collar: Wear it as told by your health care provider. Remove it only as told by your health care provider. Ask your health care provider if you can remove the cervical collar for cleaning and bathing. If you are allowed to remove the collar for cleaning or bathing: Follow instructions from your health care provider about how to remove the collar safely. Clean the collar by wiping it with mild soap and water and drying it completely. Take out any removable pads in the collar every 1-2 days, and wash them by hand with soap and water. Let them air-dry completely before you put them back in the collar. Check your skin under the collar for irritation or sores. If you see any, tell your health care provider. Managing pain   Take over-the-counter and prescription medicines only as told by your health care provider. If directed, put ice on the affected area. To do this: If you have a soft neck collar, remove it as told by your health care provider. Put ice in a plastic bag. Place a towel between your skin and the bag. Leave the ice on for 20 minutes, 2-3 times a day. Remove the ice if your skin turns bright red. This is very important. If you cannot feel pain, heat, or cold, you have a greater risk of damage to the area. If applying ice does not help, you can try using heat. Use the heat source that your health care provider recommends, such as a moist heat pack or a heating pad. Place a towel between your skin and   the heat source. Leave the heat on for 20-30 minutes. Remove the heat if your skin turns bright red. This is especially important if you are unable to feel pain, heat, or cold. You have a greater risk of getting burned. Try a gentle neck and shoulder massage to help relieve symptoms. Activity Rest as needed. Return to your normal activities as told by your health care provider. Ask your health care provider what  activities are safe for you. Do stretching and strengthening exercises as told by your health care provider or your physical therapist. You may have to avoid lifting. Ask your health care provider how much you can safely lift. General instructions Use a flat pillow when you sleep. Do not drive while wearing a cervical collar. If you do not have a cervical collar, ask your health care provider if it is safe to drive while your neck heals. Ask your health care provider if the medicine prescribed to you requires you to avoid driving or using machinery. Do not use any products that contain nicotine or tobacco. These products include cigarettes, chewing tobacco, and vaping devices, such as e-cigarettes. If you need help quitting, ask your health care provider. Keep all follow-up visits. This is important. Contact a health care provider if: Your condition does not improve with treatment. Get help right away if: Your pain gets much worse and is not controlled with medicines. You have weakness or numbness in your hand, arm, face, or leg. You have a high fever. You have a stiff, rigid neck. You lose control of your bowels or your bladder (have incontinence). You have trouble with walking, balance, or speaking. Summary Cervical radiculopathy happens when a nerve in the neck is pinched or bruised. A nerve can get pinched from a bulging disk, arthritis, muscle spasms, or an injury to the neck. Symptoms include pain, tingling, or numbness radiating from the neck to the arm or hand. Weakness can also occur in severe cases. Treatment may include rest, wearing a cervical collar, and physical therapy. Medicines may be prescribed to help with pain. In severe cases, injections or surgery may be needed. This information is not intended to replace advice given to you by your health care provider. Make sure you discuss any questions you have with your health care provider. Document Revised: 04/16/2021 Document  Reviewed: 04/16/2021 Elsevier Patient Education  2022 [employer] Inc.  

## 2021-10-13 NOTE — Assessment & Plan Note (Addendum)
Ongoing, imaging showed cervical spondylosis.  On exam suspect shoulder pain originating from neck area.  She starts PT this afternoon -- continue this.  Will place referral to Dr. Christella Noa with neurosurgery to further advise, suspect some impingement presenting from neck area.  Celebrex script sent to start at this time, educated her on this.

## 2021-10-13 NOTE — Assessment & Plan Note (Signed)
Stable at this time without medication.  Denies SI/HI.  Continue current plan and restart medication as needed.

## 2021-10-14 LAB — LIPID PANEL W/O CHOL/HDL RATIO
Cholesterol, Total: 211 mg/dL — ABNORMAL HIGH (ref 100–199)
HDL: 55 mg/dL (ref >39)
LDL Chol Calc (NIH): 140 mg/dL — ABNORMAL HIGH (ref 0–99)
Triglycerides: 88 mg/dL (ref 0–149)
VLDL Cholesterol Cal: 16 mg/dL (ref 5–40)

## 2021-10-14 LAB — VITAMIN B12: Vitamin B-12: 684 pg/mL (ref 232–1245)

## 2021-10-14 LAB — TSH: TSH: 2.6 u[IU]/mL (ref 0.450–4.500)

## 2021-10-14 LAB — VITAMIN D 25 HYDROXY (VIT D DEFICIENCY, FRACTURES): Vit D, 25-Hydroxy: 44.8 ng/mL (ref 30.0–100.0)

## 2021-10-14 NOTE — Progress Notes (Signed)
Contacted via [place] The 10-year ASCVD risk score (Arnett DK, et al., 2019) is: 5.9%   Values used to calculate the score:     Age: 70 years     Sex: Female     Is Non-Hispanic African American: No     Diabetic: No     Tobacco smoker: No     Systolic Blood Pressure: 99 mmHg     Is BP treated: No     HDL Cholesterol: 55 mg/dL     Total Cholesterol: 211 mg/dL   Good morning Breanna Davidson, your labs have returned: - Cholesterol levels remain elevated, but are trending down.  Continue fish oil and red yeast rice + diet focus at home. - Thyroid, B12, and Vitamin D labs are all normal.  Continue your supplements.  Any questions? Keep being amazing!!  Thank you for allowing me to participate in your care.  I appreciate you. Kindest regards, Dryden Tapley

## 2021-10-15 ENCOUNTER — Ambulatory Visit
Admission: RE | Admit: 2021-10-15 | Discharge: 2021-10-15 | Disposition: A | Discharge status: Home / Self Care | Payer: Medicare Other | Source: Ambulatory Visit | Attending: Nurse Practitioner | Admitting: Nurse Practitioner

## 2021-10-15 ENCOUNTER — Other Ambulatory Visit: Payer: Self-pay

## 2021-10-15 DIAGNOSIS — Z1231 Encounter for screening mammogram for malignant neoplasm of breast: Secondary | ICD-10-CM | POA: Insufficient documentation

## 2021-10-15 DIAGNOSIS — M85851 Other specified disorders of bone density and structure, right thigh: Secondary | ICD-10-CM | POA: Insufficient documentation

## 2021-10-15 DIAGNOSIS — Z780 Asymptomatic menopausal state: Secondary | ICD-10-CM | POA: Insufficient documentation

## 2021-10-15 DIAGNOSIS — Z13820 Encounter for screening for osteoporosis: Secondary | ICD-10-CM | POA: Diagnosis not present

## 2021-10-15 NOTE — Progress Notes (Signed)
Contacted via MyChart   Good evening Breanna Davidson, your bone scan has returned: Your bone density shows thinning bones (osteopenia) but not brittle (osteoporosis). We recommend Vitamin D supplementation of about 2,0000 IUs of over the counter Vitamin D3. In addition, we recommend a diet high in calcium with dairy and dark green leafy vegetables. We would like you to get plenty of weight bearing exercises with walking and resistance training such as light weights or resistance bands available with instructions at places such as [employer].  Any questions? Keep being amazing!!  Thank you for allowing me to participate in your care.  I appreciate you. Kindest regards, Romelle Muldoon

## 2021-10-16 NOTE — Progress Notes (Signed)
Contacted via MyChart   Good afternoon Breanna Davidson, your mammogram is normal.  Repeat in one year.:)

## 2021-11-05 DIAGNOSIS — R030 Elevated blood-pressure reading, without diagnosis of hypertension: Secondary | ICD-10-CM | POA: Diagnosis not present

## 2021-11-05 DIAGNOSIS — M4722 Other spondylosis with radiculopathy, cervical region: Secondary | ICD-10-CM | POA: Diagnosis not present

## 2021-11-06 ENCOUNTER — Other Ambulatory Visit: Payer: Self-pay | Admitting: Neurosurgery

## 2021-11-06 DIAGNOSIS — M4722 Other spondylosis with radiculopathy, cervical region: Secondary | ICD-10-CM

## 2021-11-17 DIAGNOSIS — M542 Cervicalgia: Secondary | ICD-10-CM | POA: Diagnosis not present

## 2021-11-17 DIAGNOSIS — M2578 Osteophyte, vertebrae: Secondary | ICD-10-CM | POA: Diagnosis not present

## 2021-11-17 DIAGNOSIS — M47812 Spondylosis without myelopathy or radiculopathy, cervical region: Secondary | ICD-10-CM | POA: Diagnosis not present

## 2021-11-17 DIAGNOSIS — M4722 Other spondylosis with radiculopathy, cervical region: Secondary | ICD-10-CM | POA: Diagnosis not present

## 2021-11-19 DIAGNOSIS — M25511 Pain in right shoulder: Secondary | ICD-10-CM | POA: Diagnosis not present

## 2021-11-19 DIAGNOSIS — M25512 Pain in left shoulder: Secondary | ICD-10-CM | POA: Diagnosis not present

## 2021-11-20 ENCOUNTER — Other Ambulatory Visit: Payer: Medicare Other

## 2021-11-21 DIAGNOSIS — R7989 Other specified abnormal findings of blood chemistry: Secondary | ICD-10-CM | POA: Insufficient documentation

## 2021-11-24 ENCOUNTER — Other Ambulatory Visit: Payer: Self-pay

## 2021-11-24 ENCOUNTER — Ambulatory Visit (INDEPENDENT_AMBULATORY_CARE_PROVIDER_SITE_OTHER): Payer: Medicare Other | Admitting: Nurse Practitioner

## 2021-11-24 ENCOUNTER — Encounter: Payer: Self-pay | Admitting: Nurse Practitioner

## 2021-11-24 VITALS — BP 102/67 | HR 65 | Temp 98.5°F | Ht 62.0 in | Wt 169.6 lb

## 2021-11-24 DIAGNOSIS — M4722 Other spondylosis with radiculopathy, cervical region: Secondary | ICD-10-CM

## 2021-11-24 DIAGNOSIS — E6609 Other obesity due to excess calories: Secondary | ICD-10-CM | POA: Diagnosis not present

## 2021-11-24 DIAGNOSIS — R7989 Other specified abnormal findings of blood chemistry: Secondary | ICD-10-CM | POA: Diagnosis not present

## 2021-11-24 DIAGNOSIS — Z6831 Body mass index (BMI) 31.0-31.9, adult: Secondary | ICD-10-CM | POA: Diagnosis not present

## 2021-11-24 NOTE — Assessment & Plan Note (Signed)
BMI 31.02.  Recommended eating smaller high protein, low fat meals more frequently and exercising 30 mins a day 5 times a week with a goal of 10-15lb weight loss in the next 3 months. Patient voiced their understanding and motivation to adhere to these recommendations.

## 2021-11-24 NOTE — Progress Notes (Signed)
BP 102/67    Pulse 65    Temp 98.5 F (36.9 C) (Oral)    Ht 5\' 2"  (1.575 m)    Wt 169 lb 9.6 oz (76.9 kg)    LMP  (LMP Unknown)    SpO2 98%    BMI 31.02 kg/m    Subjective:    Patient ID: Breanna Davidson, female    DOB: May 02, 1951, 71 y.o.   MRN: [id number]  HPI: Breanna Davidson is a 71 y.o. female  Chief Complaint  Patient presents with   Neck Pain    Patient states she is scheduled for 2 upcoming MRI appointments for each shoulder. Patient states she will follow up with provider on Monday.    SHOULDER & NECK PAIN Ongoing bilateral shoulder & neck pain.  R>L, started after she was doing a painting + had a cough for several weeks. Had imaging on 10/02/21 = no arthritic changes in shoulders, neck noted cervical spondylosis.  Is seeing [facility], last visit on 11/19/21.  Is taking Tylenol and Ibuprofen only at this time -- did not tolerate Tramadol and does not take regularly.  Going for MRI shoulders on Thursday, then sees Dr. Christella Noa in one week.  She has stopped physical therapy at this time, was discontinued by therapist.  Recent LFTs in December were mildly elevated, along with alkaline phosphatase -- is taking Tylenol daily. Duration: weeks Involved shoulder: bilateral Mechanism of injury: unknown Location: diffuse Onset:sudden Severity:4-5/10 with medication on board and without medication 8/10 Quality:  dull and aching Frequency: intermittent Radiation: no Aggravating factors: lifting, movement, and throwing  Alleviating factors:  Celebrex   Status: fluctuating Treatments attempted:  Gabapentin, Celebrex, Tizanidine, Tylenol   Relief with NSAIDs?:  moderate Weakness: yes Numbness: no Decreased grip strength: no Redness: no Swelling: no Bruising: no Fevers: no   Relevant past medical, surgical, family and social history reviewed and updated as indicated. Interim medical history since our last visit reviewed. Allergies and medications reviewed and  updated.  Review of Systems  Constitutional:  Negative for activity change, appetite change, diaphoresis, fatigue and fever.  Respiratory:  Negative for cough, chest tightness and shortness of breath.   Cardiovascular:  Negative for chest pain, palpitations and leg swelling.  Gastrointestinal: Negative.   Musculoskeletal:  Positive for arthralgias and neck stiffness.  Neurological: Negative.   Psychiatric/Behavioral: Negative.     Per HPI unless specifically indicated above     Objective:    BP 102/67    Pulse 65    Temp 98.5 F (36.9 C) (Oral)    Ht 5\' 2"  (1.575 m)    Wt 169 lb 9.6 oz (76.9 kg)    LMP  (LMP Unknown)    SpO2 98%    BMI 31.02 kg/m   Wt Readings from Last 3 Encounters:  11/24/21 169 lb 9.6 oz (76.9 kg)  10/13/21 171 lb (77.6 kg)  09/25/21 173 lb 3.2 oz (78.6 kg)    Physical Exam Vitals and nursing note reviewed.  Constitutional:      General: She is awake. She is not in acute distress.    Appearance: She is well-developed and well-groomed. She is obese. She is not ill-appearing or toxic-appearing.  HENT:     Head: Normocephalic.     Right Ear: Hearing, ear canal and external ear normal.     Left Ear: Hearing, ear canal and external ear normal.  Eyes:     General: Lids are normal.  Right eye: No discharge.        Left eye: No discharge.     Conjunctiva/sclera: Conjunctivae normal.     Pupils: Pupils are equal, round, and reactive to light.  Neck:     Thyroid: No thyromegaly.     Vascular: No carotid bruit.     Comments: Muscle tension noted throughout all posterior neck area. Cardiovascular:     Rate and Rhythm: Normal rate and regular rhythm.     Heart sounds: Normal heart sounds. No murmur heard.   No gallop.  Pulmonary:     Effort: Pulmonary effort is normal. No accessory muscle usage or respiratory distress.     Breath sounds: Normal breath sounds.  Abdominal:     General: Bowel sounds are normal.     Palpations: Abdomen is soft.   Musculoskeletal:     Right shoulder: No swelling, effusion, tenderness, bony tenderness or crepitus. Decreased range of motion. Decreased strength. Normal pulse.     Left shoulder: No swelling, effusion, tenderness, bony tenderness or crepitus. Decreased range of motion. Decreased strength. Normal pulse.     Cervical back: Neck supple. No rigidity. Muscular tenderness present. No pain with movement or spinous process tenderness. Normal range of motion.     Right lower leg: No edema.     Left lower leg: No edema.  Lymphadenopathy:     Cervical: No cervical adenopathy.  Skin:    General: Skin is warm and dry.  Neurological:     Mental Status: She is alert and oriented to person, place, and time.  Psychiatric:        Attention and Perception: Attention normal.        Mood and Affect: Mood normal.        Behavior: Behavior normal. Behavior is cooperative.        Thought Content: Thought content normal.        Judgment: Judgment normal.    Results for orders placed or performed in visit on 10/13/21  Lipid Panel w/o Chol/HDL Ratio  Result Value Ref Range   Cholesterol, Total 211 (H) 100 - 199 mg/dL   Triglycerides 88 0 - 149 mg/dL   HDL 55 >39 mg/dL   VLDL Cholesterol Cal 16 5 - 40 mg/dL   LDL Chol Calc (NIH) 140 (H) 0 - 99 mg/dL  TSH  Result Value Ref Range   TSH 2.600 0.450 - 4.500 uIU/mL  VITAMIN D 25 Hydroxy (Vit-D Deficiency, Fractures)  Result Value Ref Range   Vit D, 25-Hydroxy 44.8 30.0 - 100.0 ng/mL  Vitamin B12  Result Value Ref Range   Vitamin B-12 684 232 - 1,245 pg/mL      Assessment & Plan:   Problem List Items Addressed This Visit       Nervous and Auditory   Cervical spondylosis with radiculopathy - Primary    Ongoing, being followed by neurosurgery -- will continue this collaboration.  Scheduled for upcoming imaging.  Appreciate their input.        Relevant Medications   traMADol (ULTRAM) 50 MG tablet     Other   Elevated LFTs    Recheck on labs  today, along with GGT.  Suspect may be related to Tylenol use at this time, monitor closely.      Relevant Orders   Hepatic function panel   Gamma GT   Obesity    BMI 31.02.  Recommended eating smaller high protein, low fat meals more frequently and exercising 30 mins a day  5 times a week with a goal of 10-15lb weight loss in the next 3 months. Patient voiced their understanding and motivation to adhere to these recommendations.         Follow up plan: Return in about 8 weeks (around 01/19/2022) for NECK PAIN AND MOOD.

## 2021-11-24 NOTE — Patient Instructions (Signed)
Cervical Radiculopathy ?Cervical radiculopathy means that a nerve in the neck (a cervical nerve) is pinched or bruised. This can happen because of an injury to the cervical spine (vertebrae) in the neck, or as a normal part of getting older. This condition can cause pain or loss of feeling (numbness) that runs from your neck all the way down to your arm and fingers. Often, this condition gets better with rest. Treatment may be needed if the condition does not get better. ?What are the causes? ?A neck injury. ?A bulging disk in your spine. ?Sudden muscle tightening (muscle spasms). ?Tight muscles in your neck due to overuse. ?Arthritis. ?Breakdown in the bones and joints of the spine (spondylosis) due to getting older. ?Bone spurs that form near the nerves in the neck. ?What are the signs or symptoms? ?Pain. The pain may: ?Run from the neck to the arm and hand. ?Be very bad or irritating. ?Get worse when you move your neck. ?Loss of feeling or tingling in your arm or hand. ?Weakness in your arm or hand, in very bad cases. ?How is this treated? ?In many cases, treatment is not needed for this condition. With rest, the condition often gets better over time. If treatment is needed, options may include: ?Wearing a soft neck collar (cervical collar) for short periods of time. ?Doing exercises (physical therapy) to strengthen your neck muscles. ?Taking medicines. ?Having shots (injections) in your spine, in very bad cases. ?Having surgery. This may be needed if other treatments do not help. The type of surgery that is used will depend on the cause of your condition. ?Follow these instructions at home: ?If you have a soft neck collar: ?Wear it as told by your doctor. Take it off only as told by your doctor. ?Ask your doctor if you can take the collar off for cleaning and bathing. If you are allowed to take the collar off for cleaning or bathing: ?Follow instructions from your doctor about how to take off the collar  safely. ?Clean the collar by wiping it with mild soap and water and drying it completely. ?Take out any removable pads in the collar every 1-2 days. Wash them by hand with soap and water. Let them air-dry completely before you put them back in the collar. ?Check your skin under the collar for redness or sores. If you see any, tell your doctor. ?Managing pain ?  ?Take over-the-counter and prescription medicines only as told by your doctor. ?If told, put ice on the painful area. To do this: ?If you have a soft neck collar, take if off as told by your doctor. ?Put ice in a plastic bag. ?Place a towel between your skin and the bag. ?Leave the ice on for 20 minutes, 2-3 times a day. ?Take off the ice if your skin turns bright red. This is very important. If you cannot feel pain, heat, or cold, you have a greater risk of damage to the area. ?If using ice does not help, you can try using heat. Use the heat source that your doctor recommends, such as a moist heat pack or a heating pad. ?Place a towel between your skin and the heat source. ?Leave the heat on for 20-30 minutes. ?Take off the heat if your skin turns bright red. This is very important. If you cannot feel pain, heat, or cold, you have a greater risk of getting burned. ?You may try a gentle neck and shoulder rub (massage). ?Activity ?Rest as needed. ?Return to your normal   activities when your doctor says that it is safe. ?Do exercises as told by your doctor or physical therapist. ?You may have to avoid lifting. Ask your doctor how much you can safely lift. ?General instructions ?Use a flat pillow when you sleep. ?Do not drive while wearing a soft neck collar. If you do not have a soft neck collar, ask your doctor if it is safe to drive while your neck heals. ?Ask your doctor if you should avoid driving or using machines while you are taking your medicine. ?Do not smoke or use any products that contain nicotine or tobacco. If you need help quitting, ask your  doctor. ?Keep all follow-up visits. ?Contact a doctor if: ?Your condition does not get better with treatment. ?Get help right away if: ?Your pain gets worse and medicine does not help. ?You lose feeling or feel weak in your hand, arm, face, or leg. ?You have a high fever. ?Your neck is stiff. ?You cannot control when you poop or pee (have incontinence). ?You have trouble with walking, balance, or talking. ?Summary ?Cervical radiculopathy means that a nerve in the neck is pinched or bruised. ?A nerve can get pinched from a bulging disk, arthritis, an injury to the neck, or other causes. ?Symptoms include pain, tingling, or loss of feeling that goes from the neck to the arm or hand. ?Weakness in your arm or hand can happen in very bad cases. ?Treatment may include resting, wearing a soft neck collar, and doing exercises. You might need to take medicines for pain. In very bad cases, shots or surgery may be needed. ?This information is not intended to replace advice given to you by your health care provider. Make sure you discuss any questions you have with your health care provider. ?Document Revised: 04/16/2021 Document Reviewed: 04/16/2021 ?Elsevier Patient Education ? 2022 [employer] ? ?

## 2021-11-24 NOTE — Assessment & Plan Note (Signed)
Recheck on labs today, along with GGT.  Suspect may be related to Tylenol use at this time, monitor closely.

## 2021-11-24 NOTE — Assessment & Plan Note (Signed)
Ongoing, being followed by neurosurgery -- will continue this collaboration.  Scheduled for upcoming imaging.  Appreciate their input.

## 2021-11-25 LAB — HEPATIC FUNCTION PANEL
ALT: 16 IU/L (ref 0–32)
AST: 20 IU/L (ref 0–40)
Albumin: 4.3 g/dL (ref 3.8–4.8)
Alkaline Phosphatase: 82 IU/L (ref 44–121)
Bilirubin Total: <0.2 mg/dL (ref 0.0–1.2)
Bilirubin, Direct: <0.1 mg/dL (ref 0.00–0.40)
Total Protein: 6.7 g/dL (ref 6.0–8.5)

## 2021-11-25 LAB — GAMMA GT: GGT: 61 IU/L — ABNORMAL HIGH (ref 0–60)

## 2021-11-25 NOTE — Progress Notes (Signed)
Contacted via MyChart   Good morning Breanna Davidson, your liver function has returned and AST and ALT are normal this check as is alkaline phosphatase -- GGT is mildly elevated (which sometimes looks at gall bladder) however, this is very mild elevation and something we can continue to monitor as needed.  Any questions? Keep being awesome!!  Thank you for allowing me to participate in your care.  I appreciate you. Kindest regards, Cherrell Maybee

## 2021-11-26 DIAGNOSIS — M67812 Other specified disorders of synovium, left shoulder: Secondary | ICD-10-CM | POA: Diagnosis not present

## 2021-11-26 DIAGNOSIS — M25411 Effusion, right shoulder: Secondary | ICD-10-CM | POA: Diagnosis not present

## 2021-11-26 DIAGNOSIS — M67811 Other specified disorders of synovium, right shoulder: Secondary | ICD-10-CM | POA: Diagnosis not present

## 2021-11-26 DIAGNOSIS — M25412 Effusion, left shoulder: Secondary | ICD-10-CM | POA: Diagnosis not present

## 2021-11-26 DIAGNOSIS — M25511 Pain in right shoulder: Secondary | ICD-10-CM | POA: Diagnosis not present

## 2021-11-26 DIAGNOSIS — M25512 Pain in left shoulder: Secondary | ICD-10-CM | POA: Diagnosis not present

## 2021-11-30 DIAGNOSIS — R030 Elevated blood-pressure reading, without diagnosis of hypertension: Secondary | ICD-10-CM | POA: Diagnosis not present

## 2021-11-30 DIAGNOSIS — M25512 Pain in left shoulder: Secondary | ICD-10-CM | POA: Diagnosis not present

## 2021-11-30 DIAGNOSIS — M25511 Pain in right shoulder: Secondary | ICD-10-CM | POA: Diagnosis not present

## 2021-12-02 DIAGNOSIS — M25512 Pain in left shoulder: Secondary | ICD-10-CM | POA: Diagnosis not present

## 2021-12-02 DIAGNOSIS — M25511 Pain in right shoulder: Secondary | ICD-10-CM | POA: Diagnosis not present

## 2021-12-08 DIAGNOSIS — M6281 Muscle weakness (generalized): Secondary | ICD-10-CM | POA: Diagnosis not present

## 2021-12-08 DIAGNOSIS — S46012D Strain of muscle(s) and tendon(s) of the rotator cuff of left shoulder, subsequent encounter: Secondary | ICD-10-CM | POA: Diagnosis not present

## 2021-12-08 DIAGNOSIS — M25511 Pain in right shoulder: Secondary | ICD-10-CM | POA: Diagnosis not present

## 2021-12-08 DIAGNOSIS — M19011 Primary osteoarthritis, right shoulder: Secondary | ICD-10-CM | POA: Diagnosis not present

## 2021-12-08 DIAGNOSIS — M25512 Pain in left shoulder: Secondary | ICD-10-CM | POA: Diagnosis not present

## 2021-12-08 DIAGNOSIS — S46011D Strain of muscle(s) and tendon(s) of the rotator cuff of right shoulder, subsequent encounter: Secondary | ICD-10-CM | POA: Diagnosis not present

## 2021-12-08 DIAGNOSIS — M19012 Primary osteoarthritis, left shoulder: Secondary | ICD-10-CM | POA: Diagnosis not present

## 2021-12-14 DIAGNOSIS — M25512 Pain in left shoulder: Secondary | ICD-10-CM | POA: Diagnosis not present

## 2021-12-14 DIAGNOSIS — M19011 Primary osteoarthritis, right shoulder: Secondary | ICD-10-CM | POA: Diagnosis not present

## 2021-12-14 DIAGNOSIS — M6281 Muscle weakness (generalized): Secondary | ICD-10-CM | POA: Diagnosis not present

## 2021-12-14 DIAGNOSIS — M19012 Primary osteoarthritis, left shoulder: Secondary | ICD-10-CM | POA: Diagnosis not present

## 2021-12-14 DIAGNOSIS — S46011D Strain of muscle(s) and tendon(s) of the rotator cuff of right shoulder, subsequent encounter: Secondary | ICD-10-CM | POA: Diagnosis not present

## 2021-12-14 DIAGNOSIS — S46012D Strain of muscle(s) and tendon(s) of the rotator cuff of left shoulder, subsequent encounter: Secondary | ICD-10-CM | POA: Diagnosis not present

## 2021-12-14 DIAGNOSIS — M25511 Pain in right shoulder: Secondary | ICD-10-CM | POA: Diagnosis not present

## 2021-12-22 DIAGNOSIS — M19011 Primary osteoarthritis, right shoulder: Secondary | ICD-10-CM | POA: Diagnosis not present

## 2021-12-22 DIAGNOSIS — M19012 Primary osteoarthritis, left shoulder: Secondary | ICD-10-CM | POA: Diagnosis not present

## 2021-12-22 DIAGNOSIS — M25512 Pain in left shoulder: Secondary | ICD-10-CM | POA: Diagnosis not present

## 2021-12-22 DIAGNOSIS — M6281 Muscle weakness (generalized): Secondary | ICD-10-CM | POA: Diagnosis not present

## 2021-12-22 DIAGNOSIS — S46012D Strain of muscle(s) and tendon(s) of the rotator cuff of left shoulder, subsequent encounter: Secondary | ICD-10-CM | POA: Diagnosis not present

## 2021-12-22 DIAGNOSIS — M25511 Pain in right shoulder: Secondary | ICD-10-CM | POA: Diagnosis not present

## 2021-12-22 DIAGNOSIS — S46011D Strain of muscle(s) and tendon(s) of the rotator cuff of right shoulder, subsequent encounter: Secondary | ICD-10-CM | POA: Diagnosis not present

## 2021-12-29 DIAGNOSIS — S46012D Strain of muscle(s) and tendon(s) of the rotator cuff of left shoulder, subsequent encounter: Secondary | ICD-10-CM | POA: Diagnosis not present

## 2021-12-29 DIAGNOSIS — M6281 Muscle weakness (generalized): Secondary | ICD-10-CM | POA: Diagnosis not present

## 2021-12-29 DIAGNOSIS — M25511 Pain in right shoulder: Secondary | ICD-10-CM | POA: Diagnosis not present

## 2021-12-29 DIAGNOSIS — S46011D Strain of muscle(s) and tendon(s) of the rotator cuff of right shoulder, subsequent encounter: Secondary | ICD-10-CM | POA: Diagnosis not present

## 2021-12-29 DIAGNOSIS — M19012 Primary osteoarthritis, left shoulder: Secondary | ICD-10-CM | POA: Diagnosis not present

## 2021-12-29 DIAGNOSIS — M25512 Pain in left shoulder: Secondary | ICD-10-CM | POA: Diagnosis not present

## 2021-12-29 DIAGNOSIS — M19011 Primary osteoarthritis, right shoulder: Secondary | ICD-10-CM | POA: Diagnosis not present

## 2022-01-13 DIAGNOSIS — M19011 Primary osteoarthritis, right shoulder: Secondary | ICD-10-CM | POA: Diagnosis not present

## 2022-01-17 NOTE — Patient Instructions (Signed)
Neck Exercises Ask your health care provider which exercises are safe for you. Do exercises exactly as told by your health care provider and adjust them as directed. It is normal to feel mild stretching, pulling, tightness, or discomfort as you do these exercises. Stop right away if you feel sudden pain or your pain gets worse. Do not begin these exercises until told by your health care provider. Neck exercises can be important for many reasons. They can improve strength and maintain flexibility in your neck, which will help your upper back and prevent neck pain. Stretching exercises Rotation neck stretching  Sit in a chair or stand up. Place your feet flat on the floor, shoulder-width apart. Slowly turn your head (rotate) to the right until a slight stretch is felt. Turn it all the way to the right so you can look over your right shoulder. Do not tilt or tip your head. Hold this position for 10-30 seconds. Slowly turn your head (rotate) to the left until a slight stretch is felt. Turn it all the way to the left so you can look over your left shoulder. Do not tilt or tip your head. Hold this position for 10-30 seconds. Repeat __________ times. Complete this exercise __________ times a day. Neck retraction  Sit in a sturdy chair or stand up. Look straight ahead. Do not bend your neck. Use your fingers to push your chin backward (retraction). Do not bend your neck for this movement. Continue to face straight ahead. If you are doing the exercise properly, you will feel a slight sensation in your throat and a stretch at the back of your neck. Hold the stretch for 1-2 seconds. Repeat __________ times. Complete this exercise __________ times a day. Strengthening exercises Neck press  Lie on your back on a firm bed or on the floor with a pillow under your head. Use your neck muscles to push your head down on the pillow and straighten your spine. Hold the position as well as you can. Keep your head  facing up (in a neutral position) and your chin tucked. Slowly count to 5 while holding this position. Repeat __________ times. Complete this exercise __________ times a day. Isometrics These are exercises in which you strengthen the muscles in your neck while keeping your neck still (isometrics). Sit in a supportive chair and place your hand on your forehead. Keep your head and face facing straight ahead. Do not flex or extend your neck while doing isometrics. Push forward with your head and neck while pushing back with your hand. Hold for 10 seconds. Do the sequence again, this time putting your hand against the back of your head. Use your head and neck to push backward against the hand pressure. Finally, do the same exercise on either side of your head, pushing sideways against the pressure of your hand. Repeat __________ times. Complete this exercise __________ times a day. Prone head lifts  Lie face-down (prone position), resting on your elbows so that your chest and upper back are raised. Start with your head facing downward, near your chest. Position your chin either on or near your chest. Slowly lift your head upward. Lift until you are looking straight ahead. Then continue lifting your head as far back as you can comfortably stretch. Hold your head up for 5 seconds. Then slowly lower it to your starting position. Repeat __________ times. Complete this exercise __________ times a day. Supine head lifts  Lie on your back (supine position), bending your knees   to point to the ceiling and keeping your feet flat on the floor. Lift your head slowly off the floor, raising your chin toward your chest. Hold for 5 seconds. Repeat __________ times. Complete this exercise __________ times a day. Scapular retraction  Stand with your arms at your sides. Look straight ahead. Slowly pull both shoulders (scapulae) backward and downward (retraction) until you feel a stretch between your shoulder  blades in your upper back. Hold for 10-30 seconds. Relax and repeat. Repeat __________ times. Complete this exercise __________ times a day. Contact a health care provider if: Your neck pain or discomfort gets worse when you do an exercise. Your neck pain or discomfort does not improve within 2 hours after you exercise. If you have any of these problems, stop exercising right away. Do not do the exercises again unless your health care provider says that you can. Get help right away if: You develop sudden, severe neck pain. If this happens, stop exercising right away. Do not do the exercises again unless your health care provider says that you can. This information is not intended to replace advice given to you by your health care provider. Make sure you discuss any questions you have with your health care provider. Document Revised: 04/07/2021 Document Reviewed: 04/07/2021 Elsevier Patient Education  2022 [employer]  

## 2022-01-19 ENCOUNTER — Other Ambulatory Visit: Payer: Self-pay

## 2022-01-19 ENCOUNTER — Ambulatory Visit (INDEPENDENT_AMBULATORY_CARE_PROVIDER_SITE_OTHER): Payer: Medicare Other | Admitting: Nurse Practitioner

## 2022-01-19 ENCOUNTER — Encounter: Payer: Self-pay | Admitting: Nurse Practitioner

## 2022-01-19 VITALS — BP 124/74 | HR 59 | Temp 97.8°F | Ht 62.0 in | Wt 162.2 lb

## 2022-01-19 DIAGNOSIS — M25511 Pain in right shoulder: Secondary | ICD-10-CM | POA: Diagnosis not present

## 2022-01-19 DIAGNOSIS — E6609 Other obesity due to excess calories: Secondary | ICD-10-CM

## 2022-01-19 DIAGNOSIS — R7989 Other specified abnormal findings of blood chemistry: Secondary | ICD-10-CM | POA: Diagnosis not present

## 2022-01-19 DIAGNOSIS — M25512 Pain in left shoulder: Secondary | ICD-10-CM | POA: Diagnosis not present

## 2022-01-19 DIAGNOSIS — M4722 Other spondylosis with radiculopathy, cervical region: Secondary | ICD-10-CM

## 2022-01-19 DIAGNOSIS — Z6830 Body mass index (BMI) 30.0-30.9, adult: Secondary | ICD-10-CM

## 2022-01-19 DIAGNOSIS — F330 Major depressive disorder, recurrent, mild: Secondary | ICD-10-CM

## 2022-01-19 NOTE — Assessment & Plan Note (Signed)
Recheck on labs today, recent labs improved. 

## 2022-01-19 NOTE — Assessment & Plan Note (Signed)
BMI 29.67.  Recommended eating smaller high protein, low fat meals more frequently and exercising 30 mins a day 5 times a week with a goal of 10-15lb weight loss in the next 3 months. Patient voiced their understanding and motivation to adhere to these recommendations. ? ?

## 2022-01-19 NOTE — Assessment & Plan Note (Signed)
Will continue collaboration with ortho, scheduled for rotator cuff repair upcoming. ?

## 2022-01-19 NOTE — Assessment & Plan Note (Signed)
Stable at this time without medication.  Denies SI/HI.  Continue current plan and restart medication as needed. ?

## 2022-01-19 NOTE — Assessment & Plan Note (Signed)
Ongoing, being followed by neurosurgery -- will continue this collaboration.  Appreciate their input.   

## 2022-01-19 NOTE — Progress Notes (Signed)
? ?BP 124/74   Pulse (!) 59   Temp 97.8 ?F (36.6 ?C) (Oral)   Ht '5\' 2"'$  (1.575 m)   Wt 162 lb 3.2 oz (73.6 kg)   LMP  (LMP Unknown)   SpO2 100%   BMI 29.67 kg/m?   ? ?Subjective:  ? ? Patient ID: Breanna Davidson, female    DOB: 1950-12-13, 71 y.o.   MRN: [id number] ? ?HPI: ?Breanna Davidson is a 71 y.o. female ? ?Chief Complaint  ?Patient presents with  ? Neck Pain  ?  Patient states she went to see a doctor in [place] and states she had imaging in February. Patient states she is having scheduled surgery on rotator cuff on Thursday and she will go back to Dr.Murphy on April 10 th. Patient would like to discuss with provider at today's visit.   ? Mood  ? ?SHOULDER & NECK PAIN ?Ongoing bilateral shoulder & neck pain -- was seen by Dr. Christella Noa who then sent to Dr. Percell Miller.  Had imaging on 10/02/21 = no arthritic changes in shoulders, neck noted cervical spondylosis.  She is going for rotator cuff repair (left) on Thursday. ? ?Is seeing [facility], last visit on 11/30/21.  Is taking Tylenol and Ibuprofen only at this time -- did not tolerate Tramadol and does not take regularly.  Cortisone shot helped a lot with pain recently. ?Duration: weeks ?Involved shoulder: bilateral ?Mechanism of injury: unknown ?Location: diffuse ?Onset:sudden ?Severity:varies ?Quality:  dull and aching ?Frequency: intermittent ?Radiation: no ?Aggravating factors: lifting, movement, and throwing  ?Alleviating factors:  Celebrex   ?Status: fluctuating ?Treatments attempted:  Gabapentin, Celebrex, Tizanidine, Tylenol   ?Relief with NSAIDs?:  moderate ?Weakness: yes ?Numbness: no ?Decreased grip strength: no ?Redness: no ?Swelling: no ?Bruising: no ?Fevers: no  ? ?DEPRESSION ?No current medications for this. ?Mood status: controlled ?Satisfied with current treatment?: yes ?Previous psychiatric medications: Cymbalta ?Depressed mood: no ?Anxious mood: no ?Anhedonia: no ?Significant weight loss or gain: no ?Insomnia: none ?Fatigue:  no ?Feelings of worthlessness or guilt: no ?Impaired concentration/indecisiveness: no ?Suicidal ideations: no ?Hopelessness: no ?Crying spells: no ? ?  01/19/2022  ?  9:32 AM 10/13/2021  ?  8:56 AM 09/09/2021  ?  1:11 PM 08/03/2021  ?  3:32 PM 04/13/2021  ?  9:24 AM  ?Depression screen PHQ 2/9  ?Decreased Interest 0 0 0 0 0  ?Down, Depressed, Hopeless 0 0 0 0 0  ?PHQ - 2 Score 0 0 0 0 0  ?Altered sleeping 0 0 0 0 0  ?Tired, decreased energy 0 0 0 0 3  ?Change in appetite 0 0 0 0 1  ?Feeling bad or failure about yourself  0 0 0 0 0  ?Trouble concentrating 0 0 0 0 0  ?Moving slowly or fidgety/restless 0 0 0 0 0  ?Suicidal thoughts 0 0 0 0 0  ?PHQ-9 Score 0 0 0 0 4  ?Difficult doing work/chores Not difficult at all Not difficult at all Not difficult at all Not difficult at all Not difficult at all  ?  ? ?Relevant past medical, surgical, family and social history reviewed and updated as indicated. Interim medical history since our last visit reviewed. ?Allergies and medications reviewed and updated. ? ?Review of Systems  ?Constitutional:  Negative for activity change, appetite change, diaphoresis, fatigue and fever.  ?Respiratory:  Negative for cough, chest tightness and shortness of breath.   ?Cardiovascular:  Negative for chest pain, palpitations and leg swelling.  ?Gastrointestinal: Negative.   ?Musculoskeletal:  Positive for arthralgias and neck stiffness.  ?Neurological: Negative.   ?Psychiatric/Behavioral: Negative.    ? ?Per HPI unless specifically indicated above ? ?   ?Objective:  ?  ?BP 124/74   Pulse (!) 59   Temp 97.8 ?F (36.6 ?C) (Oral)   Ht '5\' 2"'$  (1.575 m)   Wt 162 lb 3.2 oz (73.6 kg)   LMP  (LMP Unknown)   SpO2 100%   BMI 29.67 kg/m?   ?Wt Readings from Last 3 Encounters:  ?01/19/22 162 lb 3.2 oz (73.6 kg)  ?11/24/21 169 lb 9.6 oz (76.9 kg)  ?10/13/21 171 lb (77.6 kg)  ?  ?Physical Exam ?Vitals and nursing note reviewed.  ?Constitutional:   ?   General: She is awake. She is not in acute distress. ?    Appearance: She is well-developed and well-groomed. She is obese. She is not ill-appearing or toxic-appearing.  ?HENT:  ?   Head: Normocephalic.  ?   Right Ear: Hearing, ear canal and external ear normal.  ?   Left Ear: Hearing, ear canal and external ear normal.  ?Eyes:  ?   General: Lids are normal.     ?   Right eye: No discharge.     ?   Left eye: No discharge.  ?   Conjunctiva/sclera: Conjunctivae normal.  ?   Pupils: Pupils are equal, round, and reactive to light.  ?Neck:  ?   Thyroid: No thyromegaly.  ?   Vascular: No carotid bruit.  ?Cardiovascular:  ?   Rate and Rhythm: Normal rate and regular rhythm.  ?   Heart sounds: Normal heart sounds. No murmur heard. ?  No gallop.  ?Pulmonary:  ?   Effort: Pulmonary effort is normal. No accessory muscle usage or respiratory distress.  ?   Breath sounds: Normal breath sounds.  ?Abdominal:  ?   General: Bowel sounds are normal.  ?   Palpations: Abdomen is soft.  ?Musculoskeletal:  ?   Right shoulder: No swelling, effusion, tenderness, bony tenderness or crepitus. Decreased range of motion. Decreased strength. Normal pulse.  ?   Left shoulder: No swelling, effusion, tenderness, bony tenderness or crepitus. Decreased range of motion. Decreased strength. Normal pulse.  ?   Cervical back: Neck supple. No rigidity. No pain with movement or spinous process tenderness. Normal range of motion.  ?   Right lower leg: No edema.  ?   Left lower leg: No edema.  ?Lymphadenopathy:  ?   Cervical: No cervical adenopathy.  ?Skin: ?   General: Skin is warm and dry.  ?Neurological:  ?   Mental Status: She is alert and oriented to person, place, and time.  ?Psychiatric:     ?   Attention and Perception: Attention normal.     ?   Mood and Affect: Mood normal.     ?   Behavior: Behavior normal. Behavior is cooperative.     ?   Thought Content: Thought content normal.     ?   Judgment: Judgment normal.  ? ? ?Results for orders placed or performed in visit on 11/24/21  ?Hepatic function panel   ?Result Value Ref Range  ? Total Protein 6.7 6.0 - 8.5 g/dL  ? Albumin 4.3 3.8 - 4.8 g/dL  ? Bilirubin Total <0.2 0.0 - 1.2 mg/dL  ? Bilirubin, Direct <0.10 0.00 - 0.40 mg/dL  ? Alkaline Phosphatase 82 44 - 121 IU/L  ? AST 20 0 - 40 IU/L  ? ALT 16 0 - 32  IU/L  ?Gamma GT  ?Result Value Ref Range  ? GGT 61 (H) 0 - 60 IU/L  ? ?   ?Assessment & Plan:  ? ?Problem List Items Addressed This Visit   ? ?  ? Nervous and Auditory  ? Cervical spondylosis with radiculopathy  ?  Ongoing, being followed by neurosurgery -- will continue this collaboration.  Appreciate their input.   ?  ?  ? Relevant Orders  ? CBC with Differential/Platelet  ? Comprehensive metabolic panel  ?  ? Other  ? Acute pain of both shoulders  ?  Will continue collaboration with ortho, scheduled for rotator cuff repair upcoming. ?  ?  ? Depression - Primary  ?  Stable at this time without medication.  Denies SI/HI.  Continue current plan and restart medication as needed. ?  ?  ? Elevated LFTs  ?  Recheck on labs today, recent labs improved. ?  ?  ? Obesity  ?  BMI 29.67.  Recommended eating smaller high protein, low fat meals more frequently and exercising 30 mins a day 5 times a week with a goal of 10-15lb weight loss in the next 3 months. Patient voiced their understanding and motivation to adhere to these recommendations. ? ?  ?  ?  ? ?Follow up plan: ?Return in about 8 months (around 09/23/2022) for Annual physical.  ?

## 2022-01-20 LAB — COMPREHENSIVE METABOLIC PANEL
ALT: 15 IU/L (ref 0–32)
AST: 13 IU/L (ref 0–40)
Albumin/Globulin Ratio: 1.8 (ref 1.2–2.2)
Albumin: 4.1 g/dL (ref 3.8–4.8)
Alkaline Phosphatase: 70 IU/L (ref 44–121)
BUN/Creatinine Ratio: 18 (ref 12–28)
BUN: 15 mg/dL (ref 8–27)
Bilirubin Total: 0.3 mg/dL (ref 0.0–1.2)
CO2: 23 mmol/L (ref 20–29)
Calcium: 9.5 mg/dL (ref 8.7–10.3)
Chloride: 104 mmol/L (ref 96–106)
Creatinine, Ser: 0.84 mg/dL (ref 0.57–1.00)
Globulin, Total: 2.3 g/dL (ref 1.5–4.5)
Glucose: 90 mg/dL (ref 70–99)
Potassium: 4.6 mmol/L (ref 3.5–5.2)
Sodium: 139 mmol/L (ref 134–144)
Total Protein: 6.4 g/dL (ref 6.0–8.5)
eGFR: 75 mL/min/{1.73_m2} (ref >59)

## 2022-01-20 LAB — CBC WITH DIFFERENTIAL/PLATELET
Basophils Absolute: 0.1 10*3/uL (ref 0.0–0.2)
Basos: 1 %
EOS (ABSOLUTE): 0.2 10*3/uL (ref 0.0–0.4)
Eos: 3 %
Hematocrit: 38.8 % (ref 34.0–46.6)
Hemoglobin: 12.8 g/dL (ref 11.1–15.9)
Immature Grans (Abs): 0 10*3/uL (ref 0.0–0.1)
Immature Granulocytes: 0 %
Lymphocytes Absolute: 2.1 10*3/uL (ref 0.7–3.1)
Lymphs: 28 %
MCH: 29 pg (ref 26.6–33.0)
MCHC: 33 g/dL (ref 31.5–35.7)
MCV: 88 fL (ref 79–97)
Monocytes Absolute: 0.6 10*3/uL (ref 0.1–0.9)
Monocytes: 9 %
Neutrophils Absolute: 4.4 10*3/uL (ref 1.4–7.0)
Neutrophils: 59 %
Platelets: 331 10*3/uL (ref 150–450)
RBC: 4.42 x10E6/uL (ref 3.77–5.28)
RDW: 14.6 % (ref 11.7–15.4)
WBC: 7.4 10*3/uL (ref 3.4–10.8)

## 2022-01-20 NOTE — Progress Notes (Signed)
Contacted via [place] ? ? ?Good morning Breanna Davidson, your labs have returned and are overall stable.  Definitely ready for surgery.  Any questions? ?Keep being amazing!!  Thank you for allowing me to participate in your care.  I appreciate you. ?Kindest regards, ?Shoshannah Faubert ?

## 2022-01-21 DIAGNOSIS — M7552 Bursitis of left shoulder: Secondary | ICD-10-CM | POA: Diagnosis not present

## 2022-01-21 DIAGNOSIS — S46812A Strain of other muscles, fascia and tendons at shoulder and upper arm level, left arm, initial encounter: Secondary | ICD-10-CM | POA: Diagnosis not present

## 2022-01-21 DIAGNOSIS — M7522 Bicipital tendinitis, left shoulder: Secondary | ICD-10-CM | POA: Diagnosis not present

## 2022-01-21 DIAGNOSIS — M19012 Primary osteoarthritis, left shoulder: Secondary | ICD-10-CM | POA: Diagnosis not present

## 2022-01-21 DIAGNOSIS — S46012A Strain of muscle(s) and tendon(s) of the rotator cuff of left shoulder, initial encounter: Secondary | ICD-10-CM | POA: Diagnosis not present

## 2022-01-21 DIAGNOSIS — M24112 Other articular cartilage disorders, left shoulder: Secondary | ICD-10-CM | POA: Diagnosis not present

## 2022-01-21 DIAGNOSIS — G8918 Other acute postprocedural pain: Secondary | ICD-10-CM | POA: Diagnosis not present

## 2022-01-21 DIAGNOSIS — M7542 Impingement syndrome of left shoulder: Secondary | ICD-10-CM | POA: Diagnosis not present

## 2022-02-01 DIAGNOSIS — M19012 Primary osteoarthritis, left shoulder: Secondary | ICD-10-CM | POA: Diagnosis not present

## 2022-02-10 DIAGNOSIS — M25612 Stiffness of left shoulder, not elsewhere classified: Secondary | ICD-10-CM | POA: Diagnosis not present

## 2022-02-10 DIAGNOSIS — M75122 Complete rotator cuff tear or rupture of left shoulder, not specified as traumatic: Secondary | ICD-10-CM | POA: Diagnosis not present

## 2022-02-10 DIAGNOSIS — M6281 Muscle weakness (generalized): Secondary | ICD-10-CM | POA: Diagnosis not present

## 2022-02-16 DIAGNOSIS — M25612 Stiffness of left shoulder, not elsewhere classified: Secondary | ICD-10-CM | POA: Diagnosis not present

## 2022-02-16 DIAGNOSIS — M75122 Complete rotator cuff tear or rupture of left shoulder, not specified as traumatic: Secondary | ICD-10-CM | POA: Diagnosis not present

## 2022-02-16 DIAGNOSIS — M6281 Muscle weakness (generalized): Secondary | ICD-10-CM | POA: Diagnosis not present

## 2022-02-23 DIAGNOSIS — M6281 Muscle weakness (generalized): Secondary | ICD-10-CM | POA: Diagnosis not present

## 2022-02-23 DIAGNOSIS — M75122 Complete rotator cuff tear or rupture of left shoulder, not specified as traumatic: Secondary | ICD-10-CM | POA: Diagnosis not present

## 2022-02-23 DIAGNOSIS — M25612 Stiffness of left shoulder, not elsewhere classified: Secondary | ICD-10-CM | POA: Diagnosis not present

## 2022-03-01 DIAGNOSIS — M25511 Pain in right shoulder: Secondary | ICD-10-CM | POA: Diagnosis not present

## 2022-03-02 DIAGNOSIS — M6281 Muscle weakness (generalized): Secondary | ICD-10-CM | POA: Diagnosis not present

## 2022-03-02 DIAGNOSIS — M75122 Complete rotator cuff tear or rupture of left shoulder, not specified as traumatic: Secondary | ICD-10-CM | POA: Diagnosis not present

## 2022-03-02 DIAGNOSIS — M25612 Stiffness of left shoulder, not elsewhere classified: Secondary | ICD-10-CM | POA: Diagnosis not present

## 2022-03-09 DIAGNOSIS — M75122 Complete rotator cuff tear or rupture of left shoulder, not specified as traumatic: Secondary | ICD-10-CM | POA: Diagnosis not present

## 2022-03-09 DIAGNOSIS — M6281 Muscle weakness (generalized): Secondary | ICD-10-CM | POA: Diagnosis not present

## 2022-03-09 DIAGNOSIS — M25612 Stiffness of left shoulder, not elsewhere classified: Secondary | ICD-10-CM | POA: Diagnosis not present

## 2022-03-11 DIAGNOSIS — H40053 Ocular hypertension, bilateral: Secondary | ICD-10-CM | POA: Diagnosis not present

## 2022-03-11 DIAGNOSIS — H40033 Anatomical narrow angle, bilateral: Secondary | ICD-10-CM | POA: Diagnosis not present

## 2022-03-16 DIAGNOSIS — M25612 Stiffness of left shoulder, not elsewhere classified: Secondary | ICD-10-CM | POA: Diagnosis not present

## 2022-03-16 DIAGNOSIS — M6281 Muscle weakness (generalized): Secondary | ICD-10-CM | POA: Diagnosis not present

## 2022-03-16 DIAGNOSIS — M75122 Complete rotator cuff tear or rupture of left shoulder, not specified as traumatic: Secondary | ICD-10-CM | POA: Diagnosis not present

## 2022-03-23 DIAGNOSIS — M6281 Muscle weakness (generalized): Secondary | ICD-10-CM | POA: Diagnosis not present

## 2022-03-23 DIAGNOSIS — M25612 Stiffness of left shoulder, not elsewhere classified: Secondary | ICD-10-CM | POA: Diagnosis not present

## 2022-03-23 DIAGNOSIS — M75122 Complete rotator cuff tear or rupture of left shoulder, not specified as traumatic: Secondary | ICD-10-CM | POA: Diagnosis not present

## 2022-03-30 DIAGNOSIS — M25612 Stiffness of left shoulder, not elsewhere classified: Secondary | ICD-10-CM | POA: Diagnosis not present

## 2022-03-30 DIAGNOSIS — M6281 Muscle weakness (generalized): Secondary | ICD-10-CM | POA: Diagnosis not present

## 2022-03-30 DIAGNOSIS — M75122 Complete rotator cuff tear or rupture of left shoulder, not specified as traumatic: Secondary | ICD-10-CM | POA: Diagnosis not present

## 2022-04-06 DIAGNOSIS — M6281 Muscle weakness (generalized): Secondary | ICD-10-CM | POA: Diagnosis not present

## 2022-04-06 DIAGNOSIS — M25612 Stiffness of left shoulder, not elsewhere classified: Secondary | ICD-10-CM | POA: Diagnosis not present

## 2022-04-06 DIAGNOSIS — M75122 Complete rotator cuff tear or rupture of left shoulder, not specified as traumatic: Secondary | ICD-10-CM | POA: Diagnosis not present

## 2022-04-13 DIAGNOSIS — M25612 Stiffness of left shoulder, not elsewhere classified: Secondary | ICD-10-CM | POA: Diagnosis not present

## 2022-04-13 DIAGNOSIS — M75122 Complete rotator cuff tear or rupture of left shoulder, not specified as traumatic: Secondary | ICD-10-CM | POA: Diagnosis not present

## 2022-04-13 DIAGNOSIS — M6281 Muscle weakness (generalized): Secondary | ICD-10-CM | POA: Diagnosis not present

## 2022-04-19 DIAGNOSIS — M25511 Pain in right shoulder: Secondary | ICD-10-CM | POA: Diagnosis not present

## 2022-04-19 DIAGNOSIS — M75122 Complete rotator cuff tear or rupture of left shoulder, not specified as traumatic: Secondary | ICD-10-CM | POA: Diagnosis not present

## 2022-04-20 DIAGNOSIS — M25612 Stiffness of left shoulder, not elsewhere classified: Secondary | ICD-10-CM | POA: Diagnosis not present

## 2022-04-20 DIAGNOSIS — M6281 Muscle weakness (generalized): Secondary | ICD-10-CM | POA: Diagnosis not present

## 2022-04-20 DIAGNOSIS — M75122 Complete rotator cuff tear or rupture of left shoulder, not specified as traumatic: Secondary | ICD-10-CM | POA: Diagnosis not present

## 2022-04-28 DIAGNOSIS — M75122 Complete rotator cuff tear or rupture of left shoulder, not specified as traumatic: Secondary | ICD-10-CM | POA: Diagnosis not present

## 2022-04-28 DIAGNOSIS — M25612 Stiffness of left shoulder, not elsewhere classified: Secondary | ICD-10-CM | POA: Diagnosis not present

## 2022-04-28 DIAGNOSIS — M6281 Muscle weakness (generalized): Secondary | ICD-10-CM | POA: Diagnosis not present

## 2022-05-04 DIAGNOSIS — M6281 Muscle weakness (generalized): Secondary | ICD-10-CM | POA: Diagnosis not present

## 2022-05-04 DIAGNOSIS — M75122 Complete rotator cuff tear or rupture of left shoulder, not specified as traumatic: Secondary | ICD-10-CM | POA: Diagnosis not present

## 2022-05-04 DIAGNOSIS — M25612 Stiffness of left shoulder, not elsewhere classified: Secondary | ICD-10-CM | POA: Diagnosis not present

## 2022-08-19 ENCOUNTER — Other Ambulatory Visit: Payer: Self-pay | Admitting: Orthopedic Surgery

## 2022-08-19 DIAGNOSIS — M24011 Loose body in right shoulder: Secondary | ICD-10-CM | POA: Diagnosis not present

## 2022-08-19 DIAGNOSIS — M7551 Bursitis of right shoulder: Secondary | ICD-10-CM | POA: Diagnosis not present

## 2022-08-19 DIAGNOSIS — M7521 Bicipital tendinitis, right shoulder: Secondary | ICD-10-CM | POA: Diagnosis not present

## 2022-08-19 DIAGNOSIS — G8918 Other acute postprocedural pain: Secondary | ICD-10-CM | POA: Diagnosis not present

## 2022-08-19 DIAGNOSIS — M7541 Impingement syndrome of right shoulder: Secondary | ICD-10-CM | POA: Diagnosis not present

## 2022-08-19 DIAGNOSIS — S46011A Strain of muscle(s) and tendon(s) of the rotator cuff of right shoulder, initial encounter: Secondary | ICD-10-CM | POA: Diagnosis not present

## 2022-08-19 DIAGNOSIS — M19011 Primary osteoarthritis, right shoulder: Secondary | ICD-10-CM | POA: Diagnosis not present

## 2022-08-19 DIAGNOSIS — S46811A Strain of other muscles, fascia and tendons at shoulder and upper arm level, right arm, initial encounter: Secondary | ICD-10-CM | POA: Diagnosis not present

## 2022-08-19 MED ORDER — ONDANSETRON 4 MG PO TBDP: 4.0000 mg | ORAL_TABLET | Freq: Two times a day (BID) | ORAL | 0 refills | Status: DC | PRN

## 2022-08-19 MED ORDER — ASPIRIN 81 MG PO TBEC: 81.0000 mg | DELAYED_RELEASE_TABLET | Freq: Two times a day (BID) | ORAL | 0 refills | Status: DC

## 2022-08-19 MED ORDER — BACLOFEN 10 MG PO TABS: 10.0000 mg | ORAL_TABLET | Freq: Three times a day (TID) | ORAL | 0 refills | Status: DC | PRN

## 2022-08-19 MED ORDER — ACETAMINOPHEN 500 MG PO TABS: 1000.0000 mg | ORAL_TABLET | Freq: Four times a day (QID) | ORAL | 2 refills | Status: DC | PRN

## 2022-08-19 MED ORDER — MELOXICAM 15 MG PO TABS: 15.0000 mg | ORAL_TABLET | Freq: Every day | ORAL | 0 refills | Status: DC | PRN

## 2022-08-19 MED ORDER — OXYCODONE HCL 5 MG PO TABS: 5.0000 mg | ORAL_TABLET | Freq: Four times a day (QID) | ORAL | 0 refills | Status: DC | PRN

## 2022-08-30 DIAGNOSIS — M19011 Primary osteoarthritis, right shoulder: Secondary | ICD-10-CM | POA: Diagnosis not present

## 2022-08-31 ENCOUNTER — Encounter: Payer: Medicare Other | Admitting: Nurse Practitioner

## 2022-09-05 NOTE — Patient Instructions (Signed)

## 2022-09-07 ENCOUNTER — Encounter: Payer: Self-pay | Admitting: Nurse Practitioner

## 2022-09-07 ENCOUNTER — Ambulatory Visit (INDEPENDENT_AMBULATORY_CARE_PROVIDER_SITE_OTHER): Payer: Medicare Other | Admitting: Nurse Practitioner

## 2022-09-07 VITALS — BP 128/71 | HR 58 | Temp 98.5°F | Ht 62.5 in | Wt 160.3 lb

## 2022-09-07 DIAGNOSIS — I700 Atherosclerosis of aorta: Secondary | ICD-10-CM | POA: Diagnosis not present

## 2022-09-07 DIAGNOSIS — Z0000 Encounter for general adult medical examination without abnormal findings: Secondary | ICD-10-CM | POA: Diagnosis not present

## 2022-09-07 DIAGNOSIS — M4722 Other spondylosis with radiculopathy, cervical region: Secondary | ICD-10-CM

## 2022-09-07 DIAGNOSIS — Z6828 Body mass index (BMI) 28.0-28.9, adult: Secondary | ICD-10-CM

## 2022-09-07 DIAGNOSIS — M8588 Other specified disorders of bone density and structure, other site: Secondary | ICD-10-CM

## 2022-09-07 DIAGNOSIS — E7800 Pure hypercholesterolemia, unspecified: Secondary | ICD-10-CM

## 2022-09-07 DIAGNOSIS — R030 Elevated blood-pressure reading, without diagnosis of hypertension: Secondary | ICD-10-CM | POA: Diagnosis not present

## 2022-09-07 DIAGNOSIS — Z23 Encounter for immunization: Secondary | ICD-10-CM

## 2022-09-07 DIAGNOSIS — R7989 Other specified abnormal findings of blood chemistry: Secondary | ICD-10-CM

## 2022-09-07 DIAGNOSIS — Z1231 Encounter for screening mammogram for malignant neoplasm of breast: Secondary | ICD-10-CM | POA: Diagnosis not present

## 2022-09-07 NOTE — Assessment & Plan Note (Signed)
Chronic, ongoing.  Recent DEXA remains stable, no worsening.  Continue supplements and check labs today.  Repeat DEXA in 2027.

## 2022-09-07 NOTE — Assessment & Plan Note (Signed)
Chronic.  Noted on lumbar imaging in October 2019.  Continue focus on healthy diet and exercise for prevention.  Does not wish to start statin or ASA.

## 2022-09-07 NOTE — Addendum Note (Signed)
Addended by: Marnee Guarneri T on: 09/07/2022 01:32 PM   Modules accepted: Level of Service

## 2022-09-07 NOTE — Assessment & Plan Note (Signed)
Recheck on labs today, recent labs improved.

## 2022-09-07 NOTE — Assessment & Plan Note (Signed)
BMI 28.85.  Recommended eating smaller high protein, low fat meals more frequently and exercising 30 mins a day 5 times a week with a goal of 10-15lb weight loss in the next 3 months. Patient voiced their understanding and motivation to adhere to these recommendations.

## 2022-09-07 NOTE — Assessment & Plan Note (Signed)
Chronic, aortic atherosclerosis noted on lumbar imaging. She is hesitant to start statins and is currently focusing on diet, exercise, and taking an omega 3 supplement. Will check lipid panel today.  ASCVD 10.7%.

## 2022-09-07 NOTE — Assessment & Plan Note (Signed)
Ongoing, being followed by neurosurgery -- will continue this collaboration.  Appreciate their input.

## 2022-09-07 NOTE — Assessment & Plan Note (Signed)
Stable with BP at goal.  Continue diet focus and regular activity.  Labs today.

## 2022-09-07 NOTE — Progress Notes (Signed)
BP 128/71   Pulse (!) 58   Temp 98.5 F (36.9 C) (Oral)   Ht 5' 2.5" (1.588 m)   Wt 160 lb 4.8 oz (72.7 kg)   LMP  (LMP Unknown)   SpO2 98%   BMI 28.85 kg/m    Subjective:    Patient ID: Breanna Davidson, female    DOB: May 30, 1951, 71 y.o.   MRN: [id number]  HPI: Breanna Davidson is a 71 y.o. female presenting on 09/07/2022 for comprehensive medical examination and wellness. Current medical complaints include:none  She currently lives with: husband Menopausal Symptoms: no  HYPERLIPIDEMIA She refuses statin therapy -- takes red yeast rice and fish oil.  Aortic atherosclerosis noted on lumbar imaging 08/02/2018.  History of elevations in LFTs and some mild elevation ESR/CRP with cervical neck issues. Hyperlipidemia status: good compliance Medication compliance: good compliance Past cholesterol meds: none Supplements: fish oil and red yeast rice Aspirin:  no The 10-year ASCVD risk score (Arnett DK, et al., 2019) is: 10.7%   Values used to calculate the score:     Age: 44 years     Sex: Female     Is Non-Hispanic African American: No     Diabetic: No     Tobacco smoker: No     Systolic Blood Pressure: 630 mmHg     Is BP treated: No     HDL Cholesterol: 55 mg/dL     Total Cholesterol: 211 mg/dL Chest pain:  no Coronary artery disease:  no Family history CAD:  yes -- father lived until 75 with higher levels and no treatment Family history early CAD:  no   OSTEOPENIA Last DEXA with osteopenia noted, 10/15/21. Satisfied with current treatment?: yes Adequate calcium & vitamin D: yes Weight bearing exercises: yes   A voluntary discussion about advance care planning including the explanation and discussion of advance directives was extensively discussed  with the patient for 15 minutes with patient. Explanation about the health care proxy and Living will was reviewed and packet with forms with explanation of how to fill them out was given.  During this discussion, the patient  was able to identify a health care proxy as husband and plans to fill out the paperwork required.  Patient was offered a separate [place] visit for further assistance with forms.  She has POA scanned into hospital chart per her report.  Depression Screen done today and results listed below:     09/07/2022    1:10 PM 01/19/2022    9:32 AM 10/13/2021    8:56 AM 09/09/2021    1:11 PM 08/03/2021    3:32 PM  Depression screen PHQ 2/9  Decreased Interest 0 0 0 0 0  Down, Depressed, Hopeless 0 0 0 0 0  PHQ - 2 Score 0 0 0 0 0  Altered sleeping 0 0 0 0 0  Tired, decreased energy 0 0 0 0 0  Change in appetite 0 0 0 0 0  Feeling bad or failure about yourself  0 0 0 0 0  Trouble concentrating 0 0 0 0 0  Moving slowly or fidgety/restless 0 0 0 0 0  Suicidal thoughts 0 0 0 0 0  PHQ-9 Score 0 0 0 0 0  Difficult doing work/chores Not difficult at all Not difficult at all Not difficult at all Not difficult at all Not difficult at all       06/05/2021    2:52 PM 08/03/2021    3:32 PM  09/09/2021   12:59 PM 09/07/2022    1:09 PM 09/07/2022    1:16 PM  Fall Risk  Falls in the past year? 0 0 0 0 0  Was there an injury with Fall? 0 0 0 0 0  Fall Risk Category Calculator 0 0 0 0 0  Fall Risk Category _0   Patient Fall Risk Level   Low fall risk Low fall risk Low fall risk  Patient at Risk for Falls Due to No Fall Risks No Fall Risks  No Fall Risks No Fall Risks  Fall risk Follow up Falls evaluation completed Falls evaluation completed Falls evaluation completed;Falls prevention discussed Falls evaluation completed Falls prevention discussed    Functional Status Survey: Is the patient deaf or have difficulty hearing?: No Does the patient have difficulty seeing, even when wearing glasses/contacts?: No Does the patient have difficulty concentrating, remembering, or making decisions?: No Does the patient have difficulty walking or climbing stairs?: No Does the patient  have difficulty dressing or bathing?: No Does the patient have difficulty doing errands alone such as visiting a doctor's office or shopping?: No   Past Medical History:  Past Medical History:  Diagnosis Date   Allergy    Chest pain    2017 ED VISIT FOR CHEST PAIN , R/O AS GASTRIC CAUSE    Depression    per NP notes, on cymbalta   Headache    per patient very rare sometimes when not well rested   Hyperlipidemia    Increased pressure in the eye    "THEY DID SURGERY ON MY EYES TO REDUCE THE PRESSURE , THEY MADE TWO HOLES IN MY EYES "   Osteopenia    Spinal stenosis    LUMBAR    Vertigo    IMPROVED BUT HAS OCCASIONAL SX    Surgical History:  Past Surgical History:  Procedure Laterality Date   APPENDECTOMY     BACK SURGERY  11/2019   CESAREAN SECTION     COLONOSCOPY WITH PROPOFOL N/A 08/26/2020   Procedure: COLONOSCOPY WITH PROPOFOL;  Surgeon: Virgel Manifold, MD;  Location: [facility] ENDOSCOPY;  Service: Endoscopy;  Laterality: N/A;   DILATION AND CURETTAGE OF UTERUS     EYE SURGERY     "THEY DID SURGERY ON MY EYES TO REDUCE THE PRESSURE , THEY MADE TWO HOLES IN MY EYES "   REDUCTION MAMMAPLASTY  1988   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   azelastine (ASTELIN) 0.1 % nasal spray Place 1 spray into both nostrils 2 (two) times daily. Use in each nostril as directed   CALCIUM-MAGNESIUM PO Take 1 tablet by mouth daily.   Cholecalciferol (VITAMIN D) 50 MCG (2000 UT) CAPS Take 2,000 Units by mouth daily.   Coenzyme Q10-Red Yeast Rice (CO Q-10 PLUS RED YEAST RICE PO) Take 1 capsule by mouth daily.    Dorzolamide HCl-Timolol Mal PF 2-0.5 % SOLN Apply 1 drop to eye daily.   fluticasone (FLONASE) 50 MCG/ACT nasal spray Place 2 sprays into both nostrils daily.   ipratropium (ATROVENT) 0.06 % nasal spray SMARTSIG:2 Spray(s) Both  Nares Twice Daily PRN   nystatin cream (MYCOSTATIN) Apply 1 application topically 2 (two) times daily.   Omega-3 Fatty Acids (FISH OIL) 1000 MG CAPS Take 1,000 mg by mouth daily.   No current facility-administered medications on file prior to visit.    Allergies:  Allergies  Allergen Reactions   Combigan [Brimonidine Tartrate-Timolol] Other (See Comments)    Irritated eyes, redness and itching   Prednisone Other (See Comments)    Altered Mental Status    Social History:  Social History   Socioeconomic History   Marital status: Married    Spouse name: Not on file   Number of children: Not on file   Years of education: college    Highest education level: Not on file  Occupational History   Occupation: retired   Tobacco Use   Smoking status: Never   Smokeless tobacco: Never  Vaping Use   Vaping Use: Never used  Substance and Sexual Activity   Alcohol use: Not Currently    Alcohol/week: 1.0 standard drink of alcohol    Types: 1 Shots of liquor per week   Drug use: No   Sexual activity: Yes  Other Topics Concern   Not on file  Social History Narrative   Not on file   Social Determinants of Health   Financial Resource Strain: Low Risk  (09/09/2021)   Overall Financial Resource Strain (CARDIA)    Difficulty of Paying Living Expenses: Not hard at all  Food Insecurity: No Food Insecurity (09/09/2021)   Hunger Vital Sign    Worried About Running Out of Food in the Last Year: Never true    [place] in the Last Year: Never true  Transportation Needs: No Transportation Needs (09/09/2021)   PRAPARE - Hydrologist (Medical): No    Lack of Transportation (Non-Medical): No  Physical Activity: Sufficiently Active  (09/09/2021)   Exercise Vital Sign    Days of Exercise per Week: 4 days    Minutes of Exercise per Session: 40 min  Stress: No Stress Concern Present (09/09/2021)   [place]    Feeling of Stress : Not at all  Social Connections: Moderately Isolated (09/09/2021)   Social Connection and Isolation Panel [NHANES]    Frequency of Communication with Friends and Family: Three times a week    Frequency of Social Gatherings with Friends and Family: More than three times a week    Attends Religious Services: Never    Marine scientist or Organizations: No    Attends Archivist Meetings: Never    Marital Status: Married  Human resources officer Violence: Not At Risk (09/09/2021)   Humiliation, Afraid, Rape, and Kick questionnaire    Fear of Current or Ex-Partner: No    Emotionally Abused: No    Physically Abused: No    Sexually Abused: No   Social History   Tobacco Use  Smoking Status Never  Smokeless Tobacco Never   Social History   Substance and Sexual Activity  Alcohol Use Not Currently   Alcohol/week: 1.0 standard drink of alcohol   Types: 1 Shots of liquor per week    Family History:  Family History  Problem Relation Age of Onset   Breast cancer Mother 44       and again at 46   Lung disease Mother    Cancer Mother    Emphysema Mother    CAD Father  Hyperlipidemia Sister    Mental illness Daughter        bipolar   Cystic fibrosis Daughter    Breast cancer Maternal Aunt    Breast cancer Maternal Grandmother     Past medical history, surgical history, medications, allergies, family history and social history reviewed with patient today and changes made to appropriate areas of the chart.   ROS All other ROS negative except what is listed above and in the HPI.      Objective:    BP 128/71   Pulse (!) 58   Temp 98.5 F (36.9 C) (Oral)   Ht 5' 2.5" (1.588 m)   Wt 160 lb 4.8 oz (72.7 kg)    LMP  (LMP Unknown)   SpO2 98%   BMI 28.85 kg/m   Wt Readings from Last 3 Encounters:  09/07/22 160 lb 4.8 oz (72.7 kg)  01/19/22 162 lb 3.2 oz (73.6 kg)  11/24/21 169 lb 9.6 oz (76.9 kg)    Physical Exam Vitals and nursing note reviewed.  Constitutional:      General: She is awake. She is not in acute distress.    Appearance: She is well-developed. She is not ill-appearing.  HENT:     Head: Normocephalic and atraumatic.     Right Ear: Hearing, tympanic membrane, ear canal and external ear normal. No drainage.     Left Ear: Hearing, tympanic membrane, ear canal and external ear normal. No drainage.     Nose: Nose normal.     Right Sinus: No maxillary sinus tenderness or frontal sinus tenderness.     Left Sinus: No maxillary sinus tenderness or frontal sinus tenderness.     Mouth/Throat:     Mouth: Mucous membranes are moist.     Pharynx: Oropharynx is clear. Uvula midline. No pharyngeal swelling, oropharyngeal exudate or posterior oropharyngeal erythema.  Eyes:     General: Lids are normal.        Right eye: No discharge.        Left eye: No discharge.     Extraocular Movements: Extraocular movements intact.     Conjunctiva/sclera: Conjunctivae normal.     Pupils: Pupils are equal, round, and reactive to light.     Visual Fields: Right eye visual fields normal and left eye visual fields normal.  Neck:     Thyroid: No thyromegaly.     Vascular: No carotid bruit.     Trachea: Trachea normal.  Cardiovascular:     Rate and Rhythm: Normal rate and regular rhythm.     Heart sounds: Normal heart sounds. No murmur heard.    No gallop.  Pulmonary:     Effort: Pulmonary effort is normal. No accessory muscle usage or respiratory distress.     Breath sounds: Normal breath sounds.  Abdominal:     General: Bowel sounds are normal.     Palpations: Abdomen is soft. There is no hepatomegaly or splenomegaly.     Tenderness: There is no abdominal tenderness.  Musculoskeletal:         General: Normal range of motion.     Cervical back: Normal range of motion and neck supple.     Right lower leg: No edema.     Left lower leg: No edema.  Lymphadenopathy:     Head:     Right side of head: No submental, submandibular, tonsillar, preauricular or posterior auricular adenopathy.     Left side of head: No submental, submandibular, tonsillar, preauricular or posterior auricular adenopathy.  Cervical: No cervical adenopathy.  Skin:    General: Skin is warm and dry.     Capillary Refill: Capillary refill takes less than 2 seconds.     Findings: No rash.  Neurological:     Mental Status: She is alert and oriented to person, place, and time.     Gait: Gait is intact.     Deep Tendon Reflexes: Reflexes are normal and symmetric.     Reflex Scores:      Brachioradialis reflexes are 2+ on the right side and 2+ on the left side.      Patellar reflexes are 2+ on the right side and 2+ on the left side. Psychiatric:        Attention and Perception: Attention normal.        Mood and Affect: Mood normal.        Speech: Speech normal.        Behavior: Behavior normal. Behavior is cooperative.        Thought Content: Thought content normal.        Judgment: Judgment normal.    Results for orders placed or performed in visit on 01/19/22  CBC with Differential/Platelet  Result Value Ref Range   WBC 7.4 3.4 - 10.8 x10E3/uL   RBC 4.42 3.77 - 5.28 x10E6/uL   Hemoglobin 12.8 11.1 - 15.9 g/dL   Hematocrit 38.8 34.0 - 46.6 %   MCV 88 79 - 97 fL   MCH 29.0 26.6 - 33.0 pg   MCHC 33.0 31.5 - 35.7 g/dL   RDW 14.6 11.7 - 15.4 %   Platelets 331 150 - 450 x10E3/uL   Neutrophils 59 Not Estab. %   Lymphs 28 Not Estab. %   Monocytes 9 Not Estab. %   Eos 3 Not Estab. %   Basos 1 Not Estab. %   Neutrophils Absolute 4.4 1.4 - 7.0 x10E3/uL   Lymphocytes Absolute 2.1 0.7 - 3.1 x10E3/uL   Monocytes Absolute 0.6 0.1 - 0.9 x10E3/uL   EOS (ABSOLUTE) 0.2 0.0 - 0.4 x10E3/uL   Basophils Absolute  0.1 0.0 - 0.2 x10E3/uL   Immature Granulocytes 0 Not Estab. %   Immature Grans (Abs) 0.0 0.0 - 0.1 x10E3/uL  Comprehensive metabolic panel  Result Value Ref Range   Glucose 90 70 - 99 mg/dL   BUN 15 8 - 27 mg/dL   Creatinine, Ser 0.84 0.57 - 1.00 mg/dL   eGFR 75 >59 mL/min/1.73   BUN/Creatinine Ratio 18 12 - 28   Sodium 139 134 - 144 mmol/L   Potassium 4.6 3.5 - 5.2 mmol/L   Chloride 104 96 - 106 mmol/L   CO2 23 20 - 29 mmol/L   Calcium 9.5 8.7 - 10.3 mg/dL   Total Protein 6.4 6.0 - 8.5 g/dL   Albumin 4.1 3.8 - 4.8 g/dL   Globulin, Total 2.3 1.5 - 4.5 g/dL   Albumin/Globulin Ratio 1.8 1.2 - 2.2   Bilirubin Total 0.3 0.0 - 1.2 mg/dL   Alkaline Phosphatase 70 44 - 121 IU/L   AST 13 0 - 40 IU/L   ALT 15 0 - 32 IU/L      Assessment & Plan:   Problem List Items Addressed This Visit       Cardiovascular and Mediastinum   Aortic atherosclerosis (HCC)    Chronic.  Noted on lumbar imaging in October 2019.  Continue focus on healthy diet and exercise for prevention.  Does not wish to start statin or ASA.  Relevant Orders   Comprehensive metabolic panel   Lipid Panel w/o Chol/HDL Ratio     Nervous and Auditory   Cervical spondylosis with radiculopathy    Ongoing, being followed by neurosurgery -- will continue this collaboration.  Appreciate their input.        Relevant Orders   C-reactive protein   Sed Rate (ESR)     Musculoskeletal and Integument   Osteopenia    Chronic, ongoing.  Recent DEXA remains stable, no worsening.  Continue supplements and check labs today.  Repeat DEXA in 2027.      Relevant Orders   VITAMIN D 25 Hydroxy (Vit-D Deficiency, Fractures)     Other   BMI 28.0-28.9,adult    BMI 28.85.  Recommended eating smaller high protein, low fat meals more frequently and exercising 30 mins a day 5 times a week with a goal of 10-15lb weight loss in the next 3 months. Patient voiced their understanding and motivation to adhere to these recommendations.        Elevated blood-pressure reading, without diagnosis of hypertension    Stable with BP at goal.  Continue diet focus and regular activity.  Labs today.      Relevant Orders   CBC with Differential/Platelet   TSH   Elevated LFTs    Recheck on labs today, recent labs improved.      Relevant Orders   Comprehensive metabolic panel   Hypercholesteremia    Chronic, aortic atherosclerosis noted on lumbar imaging. She is hesitant to start statins and is currently focusing on diet, exercise, and taking an omega 3 supplement. Will check lipid panel today.  ASCVD 10.7%.      Relevant Orders   Comprehensive metabolic panel   Lipid Panel w/o Chol/HDL Ratio   Other Visit Diagnoses     Medicare annual wellness visit, subsequent    -  Primary   Wellness due and performed today.   Flu vaccine need       Flu vaccine in office today.   Encounter for screening mammogram for malignant neoplasm of breast       Mammogram ordered.   Relevant Orders   MM 3D SCREEN BREAST BILATERAL   Encounter for annual physical exam       Annual physical today with labs and health maintenance reviewed, discussed with patient.        Follow up plan: Return in about 6 months (around 03/08/2023) for HLD and OSTEOPENIA.   LABORATORY TESTING:  - Pap smear: not applicable  IMMUNIZATIONS:   - Tdap: Tetanus vaccination status reviewed: last tetanus booster within 10 years. - Influenza: Up to date - Pneumovax: Up to date - Prevnar: Up to date - COVID: Up to date - HPV: Not applicable - Shingrix vaccine:  will do this in future  SCREENING: -Mammogram: Up to date  - Colonoscopy: Up to date  - Bone Density: Up to date  -Hearing Test: Not applicable  -Spirometry: Not applicable   PATIENT COUNSELING:   Advised to take 1 mg of folate supplement per day if capable of pregnancy.   Sexuality: Discussed sexually transmitted diseases, partner selection, use of condoms, avoidance of unintended pregnancy  and  contraceptive alternatives.   Advised to avoid cigarette smoking.  I discussed with the patient that most people either abstain from alcohol or drink within safe limits (<=14/week and <=4 drinks/occasion for males, <=7/weeks and <= 3 drinks/occasion for females) and that the risk for alcohol disorders and other health effects rises proportionally  with the number of drinks per week and how often a drinker exceeds daily limits.  Discussed cessation/primary prevention of drug use and availability of treatment for abuse.   Diet: Encouraged to adjust caloric intake to maintain  or achieve ideal body weight, to reduce intake of dietary saturated fat and total fat, to limit sodium intake by avoiding high sodium foods and not adding table salt, and to maintain adequate dietary potassium and calcium preferably from fresh fruits, vegetables, and low-fat dairy products.    Stressed the importance of regular exercise  Injury prevention: Discussed safety belts, safety helmets, smoke detector, smoking near bedding or upholstery.   Dental health: Discussed importance of regular tooth brushing, flossing, and dental visits.    NEXT PREVENTATIVE PHYSICAL DUE IN 1 YEAR. Return in about 6 months (around 03/08/2023) for HLD and OSTEOPENIA.

## 2022-09-08 DIAGNOSIS — M25611 Stiffness of right shoulder, not elsewhere classified: Secondary | ICD-10-CM | POA: Diagnosis not present

## 2022-09-08 DIAGNOSIS — M75121 Complete rotator cuff tear or rupture of right shoulder, not specified as traumatic: Secondary | ICD-10-CM | POA: Diagnosis not present

## 2022-09-08 DIAGNOSIS — M6281 Muscle weakness (generalized): Secondary | ICD-10-CM | POA: Diagnosis not present

## 2022-09-08 LAB — CBC WITH DIFFERENTIAL/PLATELET
Basophils Absolute: 0.1 10*3/uL (ref 0.0–0.2)
Basos: 1 %
EOS (ABSOLUTE): 0.3 10*3/uL (ref 0.0–0.4)
Eos: 4 %
Hematocrit: 39.3 % (ref 34.0–46.6)
Hemoglobin: 12.9 g/dL (ref 11.1–15.9)
Immature Grans (Abs): 0 10*3/uL (ref 0.0–0.1)
Immature Granulocytes: 0 %
Lymphocytes Absolute: 2.3 10*3/uL (ref 0.7–3.1)
Lymphs: 27 %
MCH: 29.7 pg (ref 26.6–33.0)
MCHC: 32.8 g/dL (ref 31.5–35.7)
MCV: 90 fL (ref 79–97)
Monocytes Absolute: 0.7 10*3/uL (ref 0.1–0.9)
Monocytes: 8 %
Neutrophils Absolute: 5 10*3/uL (ref 1.4–7.0)
Neutrophils: 60 %
Platelets: 396 10*3/uL (ref 150–450)
RBC: 4.35 x10E6/uL (ref 3.77–5.28)
RDW: 12.7 % (ref 11.7–15.4)
WBC: 8.4 10*3/uL (ref 3.4–10.8)

## 2022-09-08 LAB — LIPID PANEL W/O CHOL/HDL RATIO
Cholesterol, Total: 273 mg/dL — ABNORMAL HIGH (ref 100–199)
HDL: 70 mg/dL (ref >39)
LDL Chol Calc (NIH): 186 mg/dL — ABNORMAL HIGH (ref 0–99)
Triglycerides: 97 mg/dL (ref 0–149)
VLDL Cholesterol Cal: 17 mg/dL (ref 5–40)

## 2022-09-08 LAB — COMPREHENSIVE METABOLIC PANEL
ALT: 16 IU/L (ref 0–32)
AST: 16 IU/L (ref 0–40)
Albumin/Globulin Ratio: 2 (ref 1.2–2.2)
Albumin: 4.3 g/dL (ref 3.8–4.8)
Alkaline Phosphatase: 95 IU/L (ref 44–121)
BUN/Creatinine Ratio: 23 (ref 12–28)
BUN: 16 mg/dL (ref 8–27)
Bilirubin Total: 0.4 mg/dL (ref 0.0–1.2)
CO2: 21 mmol/L (ref 20–29)
Calcium: 9.5 mg/dL (ref 8.7–10.3)
Chloride: 101 mmol/L (ref 96–106)
Creatinine, Ser: 0.69 mg/dL (ref 0.57–1.00)
Globulin, Total: 2.1 g/dL (ref 1.5–4.5)
Glucose: 89 mg/dL (ref 70–99)
Potassium: 4.2 mmol/L (ref 3.5–5.2)
Sodium: 137 mmol/L (ref 134–144)
Total Protein: 6.4 g/dL (ref 6.0–8.5)
eGFR: 93 mL/min/{1.73_m2} (ref >59)

## 2022-09-08 LAB — SEDIMENTATION RATE: Sed Rate: 12 mm/hr (ref 0–40)

## 2022-09-08 LAB — C-REACTIVE PROTEIN: CRP: 2 mg/L (ref 0–10)

## 2022-09-08 LAB — VITAMIN D 25 HYDROXY (VIT D DEFICIENCY, FRACTURES): Vit D, 25-Hydroxy: 43.7 ng/mL (ref 30.0–100.0)

## 2022-09-08 LAB — TSH: TSH: 1.96 u[IU]/mL (ref 0.450–4.500)

## 2022-09-08 NOTE — Progress Notes (Signed)
Contacted via MyChart   Good afternoon Breanna Davidson, your labs have returned and overall look great with exception of cholesterol labs which have trended up a little.  I know you prefer not to take medication for this.  Your cholesterol is still high, but continued recommendations to make lifestyle changes. Your LDL is above normal. The LDL is the bad cholesterol. Over time and in combination with inflammation and other factors, this contributes to plaque which in turn may lead to stroke and/or heart attack down the road. Sometimes high LDL is primarily genetic, and people might be eating all the right foods but still have high numbers. Other times, there is room for improvement in one's diet and eating healthier can bring this number down and potentially reduce one's risk of heart attack and/or stroke.   To reduce your LDL, Remember - more fruits and vegetables, more fish, and limit red meat and dairy products. More soy, nuts, beans, barley, lentils, oats and plant sterol ester enriched margarine instead of butter. I also encourage eliminating sugar and processed food. Remember, shop on the outside of the grocery store and visit your [employer]. If you would like to talk with me about dietary changes plus or minus medications for your cholesterol, please let me know. We should recheck your cholesterol in 6 months. Keep being amazing!!  Thank you for allowing me to participate in your care.  I appreciate you. Kindest regards, Breanna Davidson

## 2022-09-20 DIAGNOSIS — H40053 Ocular hypertension, bilateral: Secondary | ICD-10-CM | POA: Diagnosis not present

## 2022-09-21 DIAGNOSIS — M6281 Muscle weakness (generalized): Secondary | ICD-10-CM | POA: Diagnosis not present

## 2022-09-21 DIAGNOSIS — M25611 Stiffness of right shoulder, not elsewhere classified: Secondary | ICD-10-CM | POA: Diagnosis not present

## 2022-09-21 DIAGNOSIS — M75121 Complete rotator cuff tear or rupture of right shoulder, not specified as traumatic: Secondary | ICD-10-CM | POA: Diagnosis not present

## 2022-09-27 DIAGNOSIS — M75121 Complete rotator cuff tear or rupture of right shoulder, not specified as traumatic: Secondary | ICD-10-CM | POA: Diagnosis not present

## 2022-09-28 DIAGNOSIS — M75121 Complete rotator cuff tear or rupture of right shoulder, not specified as traumatic: Secondary | ICD-10-CM | POA: Diagnosis not present

## 2022-09-28 DIAGNOSIS — M6281 Muscle weakness (generalized): Secondary | ICD-10-CM | POA: Diagnosis not present

## 2022-09-28 DIAGNOSIS — M25611 Stiffness of right shoulder, not elsewhere classified: Secondary | ICD-10-CM | POA: Diagnosis not present

## 2022-09-29 DIAGNOSIS — D225 Melanocytic nevi of trunk: Secondary | ICD-10-CM | POA: Diagnosis not present

## 2022-09-29 DIAGNOSIS — D2261 Melanocytic nevi of right upper limb, including shoulder: Secondary | ICD-10-CM | POA: Diagnosis not present

## 2022-09-29 DIAGNOSIS — D2272 Melanocytic nevi of left lower limb, including hip: Secondary | ICD-10-CM | POA: Diagnosis not present

## 2022-09-29 DIAGNOSIS — L570 Actinic keratosis: Secondary | ICD-10-CM | POA: Diagnosis not present

## 2022-09-29 DIAGNOSIS — L821 Other seborrheic keratosis: Secondary | ICD-10-CM | POA: Diagnosis not present

## 2022-09-29 DIAGNOSIS — D2262 Melanocytic nevi of left upper limb, including shoulder: Secondary | ICD-10-CM | POA: Diagnosis not present

## 2022-09-29 DIAGNOSIS — D485 Neoplasm of uncertain behavior of skin: Secondary | ICD-10-CM | POA: Diagnosis not present

## 2022-09-29 DIAGNOSIS — D2271 Melanocytic nevi of right lower limb, including hip: Secondary | ICD-10-CM | POA: Diagnosis not present

## 2022-09-29 DIAGNOSIS — X32XXXA Exposure to sunlight, initial encounter: Secondary | ICD-10-CM | POA: Diagnosis not present

## 2022-10-04 ENCOUNTER — Ambulatory Visit (INDEPENDENT_AMBULATORY_CARE_PROVIDER_SITE_OTHER): Payer: Medicare Other | Admitting: *Deleted

## 2022-10-04 DIAGNOSIS — Z0000 Encounter for general adult medical examination without abnormal findings: Secondary | ICD-10-CM | POA: Diagnosis not present

## 2022-10-04 NOTE — Progress Notes (Signed)
Subjective:   Breanna Davidson is a 71 y.o. female who presents for Medicare Annual (Subsequent) preventive examination.  I connected with  Breanna Davidson on 10/04/22 by a telephone enabled telemedicine application and verified that I am speaking with the correct person using two identifiers.   I discussed the limitations of evaluation and management by telemedicine. The patient expressed understanding and agreed to proceed.  Patient location: home  Provider location: Tele-health-office  .   Review of Systems     Cardiac Risk Factors include: advanced age (>19mn, >>70women)     Objective:    Today's Vitals   There is no height or weight on file to calculate BMI.     10/04/2022    8:35 AM 09/09/2021   12:59 PM 09/08/2020    2:32 PM 08/26/2020    8:12 AM 12/13/2019    8:00 AM 12/12/2019    7:35 AM 12/10/2019    9:18 AM  Advanced Directives  Does Patient Have a Medical Advance Directive? Yes Yes Yes Yes Yes Yes Yes  Type of Advance Directive Living will Healthcare [place];Healthcare Power of Attorney Living will  Living will  Does patient want to make changes to medical advance directive?     No - Patient declined    Copy of HPotomacin Chart?  Yes - validated most recent copy scanned in chart (See row information) Yes - validated most recent copy scanned in chart (See row information) No - copy requested       Current Medications (verified) Outpatient Encounter Medications as of 10/04/2022  Medication Sig   albuterol (VENTOLIN HFA) 108 (90 Base) MCG/ACT inhaler Inhale 2 puffs into the lungs every 6 (six) hours as needed for wheezing or shortness of breath.   azelastine (ASTELIN) 0.1 % nasal spray Place 1 spray into both nostrils 2 (two) times daily. Use in each nostril as directed   CALCIUM-MAGNESIUM PO Take 1 tablet by mouth daily.   Cholecalciferol (VITAMIN D) 50 MCG (2000 UT) CAPS Take  2,000 Units by mouth daily.   Coenzyme Q10-Red Yeast Rice (CO Q-10 PLUS RED YEAST RICE PO) Take 1 capsule by mouth daily.   Dorzolamide HCl-Timolol Mal PF 2-0.5 % SOLN Apply 1 drop to eye daily.   fluticasone (FLONASE) 50 MCG/ACT nasal spray Place 2 sprays into both nostrils daily.   ipratropium (ATROVENT) 0.06 % nasal spray SMARTSIG:2 Spray(s) Both Nares Twice Daily PRN   nystatin cream (MYCOSTATIN) Apply 1 application topically 2 (two) times daily.   Omega-3 Fatty Acids (FISH OIL) 1000 MG CAPS Take 1,000 mg by mouth daily.   No facility-administered encounter medications on file as of 10/04/2022.    Allergies (verified) Combigan [brimonidine tartrate-timolol] and Prednisone   History: Past Medical History:  Diagnosis Date   Allergy    Chest pain    2017 ED VISIT FOR CHEST PAIN , R/O AS GASTRIC CAUSE    Depression    per NP notes, on cymbalta   Headache    per patient very rare sometimes when not well rested   Hyperlipidemia    Increased pressure in the eye    "THEY DID SURGERY ON MY EYES TO REDUCE THE PRESSURE , THEY MADE TWO HOLES IN MY EYES "   Osteopenia    Spinal stenosis    LUMBAR    Vertigo    IMPROVED BUT HAS OCCASIONAL SX   Past Surgical History:  Procedure Laterality Date   APPENDECTOMY     BACK SURGERY  11/2019   CESAREAN SECTION     COLONOSCOPY WITH PROPOFOL N/A 08/26/2020   Procedure: COLONOSCOPY WITH PROPOFOL;  Surgeon: Breanna Manifold, MD;  Location: [facility] ENDOSCOPY;  Service: Endoscopy;  Laterality: N/A;   DILATION AND CURETTAGE OF UTERUS     EYE SURGERY     "THEY DID SURGERY ON MY EYES TO REDUCE THE PRESSURE , THEY MADE TWO HOLES IN MY EYES "   REDUCTION MAMMAPLASTY  1988   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   Procedure: TOTAL HIP ARTHROPLASTY ANTERIOR APPROACH;  Surgeon: Breanna Butters, MD;  Location: [facility];  Service: Orthopedics;  Laterality: Left;   Family History  Problem Relation Age of Onset   Breast cancer Mother 16       and again  at 9   Lung disease Mother    Cancer Mother    Emphysema Mother    CAD Father    Hyperlipidemia Sister    Mental illness Daughter        bipolar   Cystic fibrosis Daughter    Breast cancer Maternal Aunt    Breast cancer Maternal Grandmother    Social History   Socioeconomic History   Marital status: Married    Spouse name: Not on file   Number of children: Not on file   Years of education: college    Highest education level: Not on file  Occupational History   Occupation: retired   Tobacco Use   Smoking status: Never   Smokeless tobacco: Never  Vaping Use   Vaping Use: Never used  Substance and Sexual Activity   Alcohol use: Not Currently    Alcohol/week: 1.0 standard drink of alcohol    Types: 1 Shots of liquor per week   Drug use: No   Sexual activity: Yes  Other Topics Concern   Not on file  Social History Narrative   Not on file   Social Determinants of Health   Financial Resource Strain: Low Risk  (10/04/2022)   Overall Financial Resource Strain (CARDIA)    Difficulty of Paying Living Expenses: Not hard at all  Food Insecurity: No Food Insecurity (10/04/2022)   Hunger Vital Sign    Worried About Running Out of Food in the Last Year: Never true    [place] in the Last Year: Never true  Transportation Needs: No Transportation Needs (10/04/2022)   PRAPARE - Hydrologist (Medical): No    Lack of Transportation (Non-Medical): No  Physical Activity: Sufficiently Active (10/04/2022)   Exercise Vital Sign    Days of Exercise per Week: 3 days    Minutes of Exercise per Session: 50 min  Stress: No Stress Concern Present (10/04/2022)   [place]    Feeling of Stress : Not at all  Social Connections: [place] (10/04/2022)   Social Connection and Isolation Panel [NHANES]    Frequency of Communication with Friends and Family: Three times a week     Frequency of Social Gatherings with Friends and Family: Twice a week    Attends Religious Services: 1 to 4 times per year    Active Member of [employer] or Organizations: Yes    Attends Archivist Meetings: 1 to 4 times per year    Marital Status: Married    Tobacco Counseling Counseling given: Not Answered   Clinical Intake:  Pre-visit preparation completed: Yes  Pain : No/denies pain     Diabetes: No  How often do you need to have someone help you when you read instructions, pamphlets, or other written materials from your doctor or pharmacy?: 1 - Never  Diabetic?  no  Interpreter Needed?: No  Information entered by :: Leroy Kennedy LPN   Activities of Daily Living    10/04/2022    8:42 AM 10/04/2022    8:33 AM  In your present state of health, do you have any difficulty performing the following activities:  Hearing? 0 0  Vision? 0 0  Difficulty concentrating or making decisions? 0 0  Walking or climbing stairs? 0 0  Dressing or bathing? 0 0  Doing errands, shopping? 0 0  Preparing Food and eating ? N N  Using the Toilet? N N  In the past six months, have you accidently leaked urine? N N  Do you have problems with loss of bowel control? N N  Managing your Medications? N N  Managing your Finances? N N  Housekeeping or managing your Housekeeping? N N    Patient Care Team: Venita Lick, NP as PCP - General (Nurse Practitioner)  Indicate any recent Medical Services you may have received from other than [facility] providers in the past year (date may be approximate).     Assessment:   This is a routine wellness examination for [place].  Hearing/Vision screen Hearing Screening - Comments:: No trouble hearing Vision Screening - Comments:: Up to date [facility]  Dietary issues and exercise activities discussed: Current Exercise Habits: Home exercise routine, Type of exercise: strength training/weights;walking, Time (Minutes): 40, Frequency (Times/Week):  3, Weekly Exercise (Minutes/Week): 120, Intensity: Mild, Exercise limited by: orthopedic condition(s)   Goals Addressed             This Visit's Progress    Patient Stated       Would like to be able to play golf again       Depression Screen    10/04/2022    8:41 AM 09/07/2022    1:10 PM 01/19/2022    9:32 AM 01/19/2022    9:22 AM 11/24/2021    1:11 PM 10/13/2021    8:56 AM 09/09/2021    1:11 PM  PHQ 2/9 Scores  PHQ - 2 Score 0 0 0   0 0  PHQ- 9 Score 2 0 0   0 0  Exception Documentation    Patient refusal Patient refusal      Fall Risk    10/04/2022    8:35 AM 10/04/2022    8:33 AM 09/07/2022    1:16 PM 09/07/2022    1:09 PM 09/09/2021   12:59 PM  Fall Risk   Falls in the past year? 0 0 0 0 0  Number falls in past yr: 0 0 0 0 0  Injury with Fall? 0 0 0 0 0  Risk for fall due to :   No Fall Risks No Fall Risks   Follow up Falls evaluation completed;Education provided;Falls prevention discussed  Falls prevention discussed Falls evaluation completed Falls evaluation completed;Falls prevention discussed    FALL RISK PREVENTION PERTAINING TO THE HOME:  Any stairs in or around the home? No  If so, are there any without handrails? No  Home free of loose throw rugs in walkways, pet beds, electrical cords, etc? Yes  Adequate lighting in your home to reduce risk of falls? Yes   ASSISTIVE DEVICES UTILIZED TO  PREVENT FALLS:  Life alert? No  Use of a cane, walker or w/c? No  Grab bars in the bathroom? No  Shower chair or bench in shower? Yes  Elevated toilet seat or a handicapped toilet? Yes   TIMED UP AND GO:  Was the test performed? No .    Cognitive Function:        10/04/2022    8:39 AM 09/08/2020    2:38 PM 08/20/2019    9:31 AM 06/23/2018    8:17 AM 06/23/2017    8:30 AM  6CIT Screen  What Year? 0 points 0 points 0 points 0 points 0 points  What month? 0 points 0 points 0 points 0 points 0 points  What time? 0 points 0 points 0 points 0 points 0  points  Count back from 20 0 points 0 points 0 points 0 points 0 points  Months in reverse 0 points 0 points 0 points 0 points 0 points  Repeat phrase 0 points 2 points 0 points 0 points 0 points  Total Score 0 points 2 points 0 points 0 points 0 points    Immunizations Immunization History  Administered Date(s) Administered   COVID-19, mRNA, vaccine(Comirnaty)12 years and older 08/02/2022   Fluad Quad(high Dose 65+) 07/31/2019, 08/03/2021   Hepatitis A, Adult 04/13/2021   Hepatitis B, adult 04/13/2021, 05/13/2021   Influenza, High Dose Seasonal PF 07/23/2016, 07/26/2017, 08/01/2018, 08/02/2022   Influenza-Unspecified 07/13/2020   PFIZER(Purple Top)SARS-COV-2 Vaccination 11/14/2019, 12/02/2019, 08/13/2020, 06/08/2021, 09/03/2021   Pneumococcal Conjugate-13 07/23/2016   Pneumococcal Polysaccharide-23 07/26/2017   Td 11/13/2004, 04/13/2021   Zoster, Live 09/12/2015    TDAP status: Up to date  Flu Vaccine status: Up to date  Pneumococcal vaccine status: Up to date  Covid-19 vaccine status: Information provided on how to obtain vaccines.   Qualifies for Shingles Vaccine? Yes   Zostavax completed Yes   Shingrix Completed?: No.    Education has been provided regarding the importance of this vaccine. Patient has been advised to call insurance company to determine out of pocket expense if they have not yet received this vaccine. Advised may also receive vaccine at local pharmacy or Health Dept. Verbalized acceptance and understanding.  Screening Tests Health Maintenance  Topic Date Due   Zoster Vaccines- Shingrix (1 of 2) 12/08/2022 (Originally 03/11/2001)   COLONOSCOPY (Pts 45-69yr Insurance coverage will need to be confirmed)  08/27/2023   Medicare Annual Wellness (AWV)  10/05/2023   MAMMOGRAM  10/16/2023   DEXA SCAN  10/15/2026   DTaP/Tdap/Td (3 - Tdap) 04/14/2031   Pneumonia Vaccine 71 Years old  Completed   INFLUENZA VACCINE  Completed   COVID-19 Vaccine  Completed    Hepatitis C Screening  Completed   HPV VACCINES  Aged Out    Health Maintenance  There are no preventive care reminders to display for this patient.   Colorectal cancer screening: Type of screening: Colonoscopy. Completed 2021. Repeat every 3 years  Mammogram Scheduled  Bone Density status: Completed 2022. Results reflect: Bone density results: OSTEOPENIA. Repeat every 3 years.  Lung Cancer Screening: (Low Dose CT Chest recommended if Age 598-80years, 30 pack-year currently smoking OR have quit w/in 15years.) does not qualify.   Lung Cancer Screening Referral:   Additional Screening:  Hepatitis C Screening: does not qualify; Completed 2017  Vision Screening: Recommended annual ophthalmology exams for early detection of glaucoma and other disorders of the eye. Is the patient up to date with their annual eye exam?  Yes  Who is the provider or what is the name of the office in which the patient attends annual eye exams? [place] If pt is not established with a provider, would they like to be referred to a provider to establish care? No .   Dental Screening: Recommended annual dental exams for proper oral hygiene  Community Resource Referral / Chronic Care Management: CRR required this visit?  No   CCM required this visit?  No      Plan:     I have personally reviewed and noted the following in the patient's chart:   Medical and social history Use of alcohol, tobacco or illicit drugs  Current medications and supplements including opioid prescriptions. Patient is not currently taking opioid prescriptions. Functional ability and status Nutritional status Physical activity Advanced directives List of other physicians Hospitalizations, surgeries, and ER visits in previous 12 months Vitals Screenings to include cognitive, depression, and falls Referrals and appointments  In addition, I have reviewed and discussed with patient certain preventive protocols, quality  metrics, and best practice recommendations. A written personalized care plan for preventive services as well as general preventive health recommendations were provided to patient.     Leroy Kennedy, LPN   [id number]   Nurse Notes:

## 2022-10-04 NOTE — Patient Instructions (Signed)
Breanna Davidson , Thank you for taking time to come for your Medicare Wellness Visit. I appreciate your ongoing commitment to your health goals. Please review the following plan we discussed and let me know if I can assist you in the future.   These are the goals we discussed:  Goals      DIET - INCREASE WATER INTAKE     Recommend drinking at least 6-8 glasses of water a day      Patient Stated     09/08/2020, wants to lose 10 pounds     Patient Stated     Would like to be able to play golf again     Weight (lb) < 200 lb (90.7 kg)     Loose wieght        This is a list of the screening recommended for you and due dates:  Health Maintenance  Topic Date Due   Zoster (Shingles) Vaccine (1 of 2) 12/08/2022*   Colon Cancer Screening  08/27/2023   Medicare Annual Wellness Visit  10/05/2023   Mammogram  10/16/2023   DEXA scan (bone density measurement)  10/15/2026   DTaP/Tdap/Td vaccine (3 - Tdap) 04/14/2031   Pneumonia Vaccine  Completed   Flu Shot  Completed   COVID-19 Vaccine  Completed   Hepatitis C Screening: USPSTF Recommendation to screen - Ages 48-79 yo.  Completed   HPV Vaccine  Aged Out  *Topic was postponed. The date shown is not the original due date.    Advanced directives: on file   Next appointment: Follow up in one year for your annual wellness visit 03-08-2023 @ 8:20  [place] 65 Years and Older, Female Preventive care refers to lifestyle choices and visits with your health care provider that can promote health and wellness. What does preventive care include? A yearly physical exam. This is also called an annual well check. Dental exams once or twice a year. Routine eye exams. Ask your health care provider how often you should have your eyes checked. Personal lifestyle choices, including: Daily care of your teeth and gums. Regular physical activity. Eating a healthy diet. Avoiding tobacco and drug use. Limiting alcohol use. Practicing safe  sex. Taking low-dose aspirin every day. Taking vitamin and mineral supplements as recommended by your health care provider. What happens during an annual well check? The services and screenings done by your health care provider during your annual well check will depend on your age, overall health, lifestyle risk factors, and family history of disease. Counseling  Your health care provider may ask you questions about your: Alcohol use. Tobacco use. Drug use. Emotional well-being. Home and relationship well-being. Sexual activity. Eating habits. History of falls. Memory and ability to understand (cognition). Work and work Statistician. Reproductive health. Screening  You may have the following tests or measurements: Height, weight, and BMI. Blood pressure. Lipid and cholesterol levels. These may be checked every 5 years, or more frequently if you are over 49 years old. Skin check. Lung cancer screening. You may have this screening every year starting at age 41 if you have a 30-pack-year history of smoking and currently smoke or have quit within the past 15 years. Fecal occult blood test (FOBT) of the stool. You may have this test every year starting at age 74. Flexible sigmoidoscopy or colonoscopy. You may have a sigmoidoscopy every 5 years or a colonoscopy every 10 years starting at age 49. Hepatitis C blood test. Hepatitis B blood test. Sexually transmitted  disease (STD) testing. Diabetes screening. This is done by checking your blood sugar (glucose) after you have not eaten for a while (fasting). You may have this done every 1-3 years. Bone density scan. This is done to screen for osteoporosis. You may have this done starting at age 61. Mammogram. This may be done every 1-2 years. Talk to your health care provider about how often you should have regular mammograms. Talk with your health care provider about your test results, treatment options, and if necessary, the need for more  tests. Vaccines  Your health care provider may recommend certain vaccines, such as: Influenza vaccine. This is recommended every year. Tetanus, diphtheria, and acellular pertussis (Tdap, Td) vaccine. You may need a Td booster every 10 years. Zoster vaccine. You may need this after age 86. Pneumococcal 13-valent conjugate (PCV13) vaccine. One dose is recommended after age 21. Pneumococcal polysaccharide (PPSV23) vaccine. One dose is recommended after age 73. Talk to your health care provider about which screenings and vaccines you need and how often you need them. This information is not intended to replace advice given to you by your health care provider. Make sure you discuss any questions you have with your health care provider. Document Released: 11/07/2015 Document Revised: 06/30/2016 Document Reviewed: 08/12/2015 Elsevier Interactive Patient Education  2017 [place] Prevention in the Home Falls can cause injuries. They can happen to people of all ages. There are many things you can do to make your home safe and to help prevent falls. What can I do on the outside of my home? Regularly fix the edges of walkways and driveways and fix any cracks. Remove anything that might make you trip as you walk through a door, such as a raised step or threshold. Trim any bushes or trees on the path to your home. Use bright outdoor lighting. Clear any walking paths of anything that might make someone trip, such as rocks or tools. Regularly check to see if handrails are loose or broken. Make sure that both sides of any steps have handrails. Any raised decks and porches should have guardrails on the edges. Have any leaves, snow, or ice cleared regularly. Use sand or salt on walking paths during winter. Clean up any spills in your garage right away. This includes oil or grease spills. What can I do in the bathroom? Use night lights. Install grab bars by the toilet and in the tub and shower.  Do not use towel bars as grab bars. Use non-skid mats or decals in the tub or shower. If you need to sit down in the shower, use a plastic, non-slip stool. Keep the floor dry. Clean up any water that spills on the floor as soon as it happens. Remove soap buildup in the tub or shower regularly. Attach bath mats securely with double-sided non-slip rug tape. Do not have throw rugs and other things on the floor that can make you trip. What can I do in the bedroom? Use night lights. Make sure that you have a light by your bed that is easy to reach. Do not use any sheets or blankets that are too big for your bed. They should not hang down onto the floor. Have a firm chair that has side arms. You can use this for support while you get dressed. Do not have throw rugs and other things on the floor that can make you trip. What can I do in the kitchen? Clean up any spills right away. Avoid walking  on wet floors. Keep items that you use a lot in easy-to-reach places. If you need to reach something above you, use a strong step stool that has a grab bar. Keep electrical cords out of the way. Do not use floor polish or wax that makes floors slippery. If you must use wax, use non-skid floor wax. Do not have throw rugs and other things on the floor that can make you trip. What can I do with my stairs? Do not leave any items on the stairs. Make sure that there are handrails on both sides of the stairs and use them. Fix handrails that are broken or loose. Make sure that handrails are as long as the stairways. Check any carpeting to make sure that it is firmly attached to the stairs. Fix any carpet that is loose or worn. Avoid having throw rugs at the top or bottom of the stairs. If you do have throw rugs, attach them to the floor with carpet tape. Make sure that you have a light switch at the top of the stairs and the bottom of the stairs. If you do not have them, ask someone to add them for you. What else  can I do to help prevent falls? Wear shoes that: Do not have high heels. Have rubber bottoms. Are comfortable and fit you well. Are closed at the toe. Do not wear sandals. If you use a stepladder: Make sure that it is fully opened. Do not climb a closed stepladder. Make sure that both sides of the stepladder are locked into place. Ask someone to hold it for you, if possible. Clearly mark and make sure that you can see: Any grab bars or handrails. First and last steps. Where the edge of each step is. Use tools that help you move around (mobility aids) if they are needed. These include: Canes. Walkers. Scooters. Crutches. Turn on the lights when you go into a dark area. Replace any light bulbs as soon as they burn out. Set up your furniture so you have a clear path. Avoid moving your furniture around. If any of your floors are uneven, fix them. If there are any pets around you, be aware of where they are. Review your medicines with your doctor. Some medicines can make you feel dizzy. This can increase your chance of falling. Ask your doctor what other things that you can do to help prevent falls. This information is not intended to replace advice given to you by your health care provider. Make sure you discuss any questions you have with your health care provider. Document Released: 08/07/2009 Document Revised: 03/18/2016 Document Reviewed: 11/15/2014 Elsevier Interactive Patient Education  2017 [employer].

## 2022-10-11 DIAGNOSIS — M75121 Complete rotator cuff tear or rupture of right shoulder, not specified as traumatic: Secondary | ICD-10-CM | POA: Diagnosis not present

## 2022-10-11 DIAGNOSIS — M6281 Muscle weakness (generalized): Secondary | ICD-10-CM | POA: Diagnosis not present

## 2022-10-11 DIAGNOSIS — M25611 Stiffness of right shoulder, not elsewhere classified: Secondary | ICD-10-CM | POA: Diagnosis not present

## 2022-10-20 ENCOUNTER — Ambulatory Visit
Admission: RE | Admit: 2022-10-20 | Discharge: 2022-10-20 | Disposition: A | Discharge status: Home / Self Care | Payer: Medicare Other | Source: Ambulatory Visit | Attending: Nurse Practitioner | Admitting: Nurse Practitioner

## 2022-10-20 DIAGNOSIS — Z1231 Encounter for screening mammogram for malignant neoplasm of breast: Secondary | ICD-10-CM | POA: Diagnosis not present

## 2022-10-21 NOTE — Progress Notes (Signed)
Contacted via MyChart   Normal mammogram, may repeat in one year:)

## 2022-10-27 DIAGNOSIS — M25611 Stiffness of right shoulder, not elsewhere classified: Secondary | ICD-10-CM | POA: Diagnosis not present

## 2022-10-27 DIAGNOSIS — M6281 Muscle weakness (generalized): Secondary | ICD-10-CM | POA: Diagnosis not present

## 2022-10-27 DIAGNOSIS — M75121 Complete rotator cuff tear or rupture of right shoulder, not specified as traumatic: Secondary | ICD-10-CM | POA: Diagnosis not present

## 2022-11-03 DIAGNOSIS — M75121 Complete rotator cuff tear or rupture of right shoulder, not specified as traumatic: Secondary | ICD-10-CM | POA: Diagnosis not present

## 2022-11-03 DIAGNOSIS — M25611 Stiffness of right shoulder, not elsewhere classified: Secondary | ICD-10-CM | POA: Diagnosis not present

## 2022-11-03 DIAGNOSIS — M6281 Muscle weakness (generalized): Secondary | ICD-10-CM | POA: Diagnosis not present

## 2022-11-22 ENCOUNTER — Ambulatory Visit (INDEPENDENT_AMBULATORY_CARE_PROVIDER_SITE_OTHER): Payer: Medicare Other

## 2022-11-22 ENCOUNTER — Ambulatory Visit
Admission: RE | Admit: 2022-11-22 | Discharge: 2022-11-22 | Disposition: A | Discharge status: Home / Self Care | Payer: Medicare Other | Source: Ambulatory Visit | Attending: Physician Assistant | Admitting: Physician Assistant

## 2022-11-22 VITALS — BP 150/86 | HR 73 | Temp 98.7°F

## 2022-11-22 DIAGNOSIS — R0602 Shortness of breath: Secondary | ICD-10-CM | POA: Diagnosis not present

## 2022-11-22 DIAGNOSIS — R051 Acute cough: Secondary | ICD-10-CM | POA: Diagnosis not present

## 2022-11-22 DIAGNOSIS — J209 Acute bronchitis, unspecified: Secondary | ICD-10-CM | POA: Diagnosis not present

## 2022-11-22 DIAGNOSIS — R062 Wheezing: Secondary | ICD-10-CM | POA: Diagnosis not present

## 2022-11-22 DIAGNOSIS — R059 Cough, unspecified: Secondary | ICD-10-CM | POA: Diagnosis not present

## 2022-11-22 MED ORDER — ALBUTEROL SULFATE HFA 108 (90 BASE) MCG/ACT IN AERS: 1.0000 | INHALATION_SPRAY | Freq: Four times a day (QID) | RESPIRATORY_TRACT | 0 refills | Status: DC | PRN

## 2022-11-22 MED ORDER — PROMETHAZINE-DM 6.25-15 MG/5ML PO SYRP: 5.0000 mL | ORAL_SOLUTION | Freq: Four times a day (QID) | ORAL | 0 refills | Status: DC | PRN

## 2022-11-22 NOTE — Discharge Instructions (Signed)
-  Your x-ray does not show pneumonia.  You have bronchitis which is generally viral.  I have sent cough medication and an inhaler to the pharmacy.  As we discussed, bronchitis can last for 2 to 6 weeks.  If you develop a fever or feel worse you should return for reevaluation.

## 2022-11-22 NOTE — ED Triage Notes (Signed)
Pt presents with a cough, SOB, wheezing,  runny nose and chest congestion x 5 days. Pt was on vacation when symptoms started and took Amoxicillin that was prescribed to her friend. She took 3 tablets on 11/18/22 and I every 12 hours since.

## 2022-11-22 NOTE — ED Provider Notes (Signed)
[facility]    CSN: [id number] Arrival date & time: 11/22/22  1253      History   Chief Complaint Chief Complaint  Patient presents with   Cough    HPI Breanna Davidson is a 72 y.o. female presenting for 5-day history of cough, congestion, wheezing, shortness of breath, runny nose.  She also reports some fatigue.  She has not had any fevers, chills, sweats, sinus pain, abdominal pain, vomiting or diarrhea.  She reports recent travel or return from [place].  Denies any other people that she traveled with being ill.  She reports that she has a prescription for Augmentin that she took from her friend.  Has been taking that for the past 5 days and believes its helped.  Has also been taking over-the-counter cough medication.  No known exposure to flu or COVID.  No other complaints.  HPI  Past Medical History:  Diagnosis Date   Allergy    Chest pain    2017 ED VISIT FOR CHEST PAIN , R/O AS GASTRIC CAUSE    Depression    per NP notes, on cymbalta   Headache    per patient very rare sometimes when not well rested   Hyperlipidemia    Increased pressure in the eye    "THEY DID SURGERY ON MY EYES TO REDUCE THE PRESSURE , THEY MADE TWO HOLES IN MY EYES "   Osteopenia    Spinal stenosis    LUMBAR    Vertigo    IMPROVED BUT HAS OCCASIONAL SX    Patient Active Problem List   Diagnosis Date Noted   Elevated LFTs 11/21/2021   Cervical spondylosis with radiculopathy 10/13/2021   H/O total shoulder replacement, 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   Lipoma of colon    Aortic atherosclerosis ([place]) 07/27/2020   Elevated blood-pressure reading, without diagnosis of hypertension 11/19/2019   History of hip replacement, total, 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   History of lumbar fusion 12/12/2018   DDD (degenerative disc disease), lumbar 12/12/2018   BMI 28.0-28.9,adult 07/23/2016   Hypercholesteremia 09/10/2015   Osteopenia 09/10/2015   Allergic rhinitis 09/10/2015    Past Surgical History:  Procedure  Laterality Date   APPENDECTOMY     BACK SURGERY  11/2019   CESAREAN SECTION     COLONOSCOPY WITH PROPOFOL N/A 08/26/2020   Procedure: COLONOSCOPY WITH PROPOFOL;  Surgeon: Virgel Manifold, MD;  Location: [facility];  Service: Endoscopy;  Laterality: N/A;   DILATION AND CURETTAGE OF UTERUS     EYE SURGERY     "THEY DID SURGERY ON MY EYES TO REDUCE THE PRESSURE , THEY MADE TWO HOLES IN MY EYES "   REDUCTION MAMMAPLASTY  1988   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   Procedure: TOTAL HIP ARTHROPLASTY ANTERIOR APPROACH;  Surgeon: Renette Butters, MD;  Location: [facility];  Service: Orthopedics;  Laterality: Left;    OB History     Gravida  3   Para  1   Term  1   Preterm      AB  2   Living  1      SAB  1   IAB  1   Ectopic      Multiple      Live Births               Home Medications    Prior to Admission medications   Medication Sig Start Date End Date Taking? Authorizing Provider  albuterol (VENTOLIN HFA) 108 (90 Base)  MCG/ACT inhaler Inhale 1-2 puffs into the lungs every 6 (six) hours as needed for wheezing or shortness of breath. 11/22/22  Yes Danton Clap, PA-C  promethazine-dextromethorphan (PROMETHAZINE-DM) 6.25-15 MG/5ML syrup Take 5 mLs by mouth 4 (four) times daily as needed. 11/22/22  Yes Laurene Footman B, PA-C  azelastine (ASTELIN) 0.1 % nasal spray Place 1 spray into both nostrils 2 (two) times daily. Use in each nostril as directed 08/27/21   Chesley Mires, MD  CALCIUM-MAGNESIUM PO Take 1 tablet by mouth daily.    [provider]  Cholecalciferol (VITAMIN D) 50 MCG (2000 UT) CAPS Take 2,000 Units by mouth daily.    [provider]  Coenzyme Q10-Red Yeast Rice (CO Q-10 PLUS RED YEAST RICE PO) Take 1 capsule by mouth daily.    [provider]  Dorzolamide HCl-Timolol Mal PF 2-0.5 % SOLN Apply 1 drop to eye daily. 07/23/20   [provider]  fluticasone (FLONASE) 50 MCG/ACT nasal spray Place 2 sprays into both  nostrils daily. 09/22/20   Marnee Guarneri T, NP  ipratropium (ATROVENT) 0.06 % nasal spray SMARTSIG:2 Spray(s) Both Nares Twice Daily PRN 09/23/21   [provider]  nystatin cream (MYCOSTATIN) Apply 1 application topically 2 (two) times daily. 09/22/20   Cannady, Henrine Screws T, NP  Omega-3 Fatty Acids (FISH OIL) 1000 MG CAPS Take 1,000 mg by mouth daily.    [provider]    Family History Family History  Problem Relation Age of Onset   Breast cancer Mother 82       and again at 51   Lung disease Mother    Cancer Mother    Emphysema Mother    CAD Father    Hyperlipidemia Sister    Mental illness Daughter        bipolar   Cystic fibrosis Daughter    Breast cancer Maternal Aunt    Breast cancer Maternal Grandmother     Social History Social History   Tobacco Use   Smoking status: Never   Smokeless tobacco: Never  Vaping Use   Vaping Use: Never used  Substance Use Topics   Alcohol use: Not Currently    Alcohol/week: 1.0 standard drink of alcohol    Types: 1 Shots of liquor per week   Drug use: No     Allergies   Combigan [brimonidine tartrate-timolol] and Prednisone   Review of Systems Review of Systems  Constitutional:  Positive for fatigue. Negative for chills, diaphoresis and fever.  HENT:  Positive for congestion and rhinorrhea. Negative for ear pain, sinus pressure, sinus pain and sore throat.   Respiratory:  Positive for cough, shortness of breath and wheezing.   Cardiovascular:  Negative for chest pain.  Gastrointestinal:  Negative for abdominal pain, nausea and vomiting.  Musculoskeletal:  Negative for arthralgias and myalgias.  Skin:  Negative for rash.  Neurological:  Negative for weakness and headaches.  Hematological:  Negative for adenopathy.     Physical Exam Triage Vital Signs ED Triage Vitals  Enc Vitals Group     BP      Pulse      Resp      Temp      Temp src      SpO2      Weight      Height      Head Circumference       Peak Flow      Pain Score      Pain Loc  Pain Edu?      Excl. in [place]?    No data found.  Updated Vital Signs BP (!) 150/86 (BP Location: Left Arm)   Pulse 73   Temp 98.7 F (37.1 C) (Oral)   LMP  (LMP Unknown)   SpO2 99%      Physical Exam Vitals and nursing note reviewed.  Constitutional:      General: She is not in acute distress.    Appearance: Normal appearance. She is not ill-appearing or toxic-appearing.  HENT:     Head: Normocephalic and atraumatic.     Nose: Congestion present.     Mouth/Throat:     Mouth: Mucous membranes are moist.     Pharynx: Oropharynx is clear.  Eyes:     General: No scleral icterus.       Right eye: No discharge.        Left eye: No discharge.     Conjunctiva/sclera: Conjunctivae normal.  Cardiovascular:     Rate and Rhythm: Normal rate and regular rhythm.     Heart sounds: Normal heart sounds.  Pulmonary:     Effort: Pulmonary effort is normal. No respiratory distress.     Breath sounds: Wheezing (few scattered wheezes bilateral upper airway) present.  Musculoskeletal:     Cervical back: Neck supple.  Skin:    General: Skin is dry.  Neurological:     General: No focal deficit present.     Mental Status: She is alert. Mental status is at baseline.     Motor: No weakness.     Gait: Gait normal.  Psychiatric:        Mood and Affect: Mood normal.        Behavior: Behavior normal.        Thought Content: Thought content normal.      UC Treatments / Results  Labs (all labs ordered are listed, but only abnormal results are displayed) Labs Reviewed - No data to display  EKG   Radiology DG Chest 2 View  Result Date: 11/22/2022 CLINICAL DATA:  Cough, shortness of breath and wheezing for 5 days. EXAM: CHEST - 2 VIEW COMPARISON:  August 03, 2021 FINDINGS: The heart size and mediastinal contours are within normal limits. Both lungs are clear. Scoliosis of spine. IMPRESSION: No active cardiopulmonary disease.  Electronically Signed   By: Abelardo Diesel M.D.   On: 11/22/2022 14:13    Procedures Procedures (including critical care time)  Medications Ordered in UC Medications - No data to display  Initial Impression / Assessment and Plan / UC Course  I have reviewed the triage vital signs and the nursing notes.  Pertinent labs & imaging results that were available during my care of the patient were reviewed by me and considered in my medical decision making (see chart for details).   72 year old female presents for 5-day history of cough, congestion, wheezing, shortness of breath, nasal congestion.  Recent travel to and from [place].  Has been taking at home and for the past 5 days.  She got this prescription from a friend.  She is afebrile and overall well-appearing.  No acute distress.  On exam she has mild nasal congestion.  Throat clear.  Few scattered wheezes throughout bilateral upper airway.  Chest x-ray obtained today shows no acute abnormality.  Discussed results with patient.  Advised her she has bronchitis which is most likely viral.  She reports that she is going to take the last 2 days of the Augmentin.  Sent  Promethazine DM and albuterol to the pharmacy encourage plenty rest and fluids.  Reviewed returning or going to ER if fever, worsening cough or increased shortness of breath not relieved by use of the inhaler.   Final Clinical Impressions(s) / UC Diagnoses   Final diagnoses:  Acute bronchitis, unspecified organism  Acute cough  Wheezing     Discharge Instructions      -Your x-ray does not show pneumonia.  You have bronchitis which is generally viral.  I have sent cough medication and an inhaler to the pharmacy.  As we discussed, bronchitis can last for 2 to 6 weeks.  If you develop a fever or feel worse you should return for reevaluation.     ED Prescriptions     Medication Sig Dispense Auth. Provider   promethazine-dextromethorphan (PROMETHAZINE-DM) 6.25-15 MG/5ML syrup  Take 5 mLs by mouth 4 (four) times daily as needed. 118 mL Laurene Footman B, PA-C   albuterol (VENTOLIN HFA) 108 (90 Base) MCG/ACT inhaler Inhale 1-2 puffs into the lungs every 6 (six) hours as needed for wheezing or shortness of breath. 1 g Danton Clap, PA-C      PDMP not reviewed this encounter.   Danton Clap, PA-C 11/22/22 1443

## 2022-11-29 DIAGNOSIS — M75121 Complete rotator cuff tear or rupture of right shoulder, not specified as traumatic: Secondary | ICD-10-CM | POA: Diagnosis not present

## 2022-12-01 ENCOUNTER — Other Ambulatory Visit (HOSPITAL_COMMUNITY)
Admission: RE | Admit: 2022-12-01 | Discharge: 2022-12-01 | Disposition: A | Discharge status: Home / Self Care | Payer: Medicare Other | Source: Ambulatory Visit | Attending: Family Medicine | Admitting: Family Medicine

## 2022-12-01 ENCOUNTER — Encounter: Payer: Self-pay | Admitting: Family Medicine

## 2022-12-01 ENCOUNTER — Ambulatory Visit: Payer: Medicare Other | Admitting: Family Medicine

## 2022-12-01 VITALS — BP 120/67 | HR 65 | Wt 161.8 lb

## 2022-12-01 DIAGNOSIS — Z1151 Encounter for screening for human papillomavirus (HPV): Secondary | ICD-10-CM | POA: Insufficient documentation

## 2022-12-01 DIAGNOSIS — N952 Postmenopausal atrophic vaginitis: Secondary | ICD-10-CM

## 2022-12-01 DIAGNOSIS — Z01419 Encounter for gynecological examination (general) (routine) without abnormal findings: Secondary | ICD-10-CM | POA: Insufficient documentation

## 2022-12-01 DIAGNOSIS — Z124 Encounter for screening for malignant neoplasm of cervix: Secondary | ICD-10-CM | POA: Diagnosis present

## 2022-12-01 NOTE — Progress Notes (Signed)
CC: Est. Care  Pt stating hx of HPV

## 2022-12-01 NOTE — Progress Notes (Signed)
   GYNECOLOGY ANNUAL PREVENTATIVE CARE ENCOUNTER NOTE  Subjective:   Breanna Davidson is a 72 y.o. G8P1021 female here for a routine annual gynecologic exam.  Current complaints: vaginal irritation and dryness.     Denies abnormal vaginal bleeding, discharge, pelvic pain, problems with intercourse or other gynecologic concerns.    Gynecologic History No LMP recorded (lmp unknown). Patient is postmenopausal. Contraception: post menopausal status Last Pap: 2018. Results were: normal Last mammogram: 2023. Results were: normal  There are no preventive care reminders to display for this patient.  The following portions of the patient's history were reviewed and updated as appropriate: allergies, current medications, past family history, past medical history, past social history, past surgical history and problem list.  Review of Systems Pertinent items are noted in HPI.   Objective:  BP 120/67   Pulse 65   Wt 161 lb 12.8 oz (73.4 kg)   LMP  (LMP Unknown)   BMI 29.12 kg/m  CONSTITUTIONAL: Well-developed, well-nourished female in no acute distress.  HENT:  Normocephalic, atraumatic, External right and left ear normal. Oropharynx is clear and moist EYES:  No scleral icterus.  NECK: Normal range of motion, supple, no masses.  Normal thyroid.  SKIN: Skin is warm and dry. No rash noted. Not diaphoretic. No erythema. No pallor. NEUROLOGIC: Alert and oriented to person, place, and time. Normal reflexes, muscle tone coordination. No cranial nerve deficit noted. PSYCHIATRIC: Normal mood and affect. Normal behavior. Normal judgment and thought content. CARDIOVASCULAR: Normal heart rate noted, regular rhythm. 2+ distal pulses. RESPIRATORY: Effort and breath sounds normal, no problems with respiration noted. BREASTS: Symmetric in size. No masses, skin changes, nipple drainage, or lymphadenopathy. ABDOMEN: Soft,  no distention noted.  No tenderness, rebound or guarding.  PELVIC: Normal appearing  external genitalia; Normal hair pattern. Moderately atrophic vaginal mucosa and cervix. No abnormal discharge noted.  Pap smear obtained.  Normal uterine size, no other palpable masses, no uterine or adnexal tenderness. Chaperone present for exam MUSCULOSKELETAL: Normal range of motion.    Assessment and Plan:   1) Annual gynecologic examination with pap smear:    Reviewed menopausal symptoms and management.  Recommended use of hydrating agents like coconut oil or crisco Discussed HRT and patient does not desire at this time She desires pap smear as she has a history of HPV and what sounds like a LEEP. Last pap was NIL, HPV neg  1. Screening for cervical cancer - Cytology - PAP  2. Well woman exam with routine gynecological exam  3. Vaginal atrophy  4. Postmenopausal atrophic vaginitis  Please refer to After Visit Summary for other counseling recommendations.   Return in about 1 year (around 12/02/2023) for Yearly wellness exam.  Caren Macadam, MD, MPH, ABFM Attending Physician Center for [facility]

## 2022-12-03 DIAGNOSIS — M25611 Stiffness of right shoulder, not elsewhere classified: Secondary | ICD-10-CM | POA: Diagnosis not present

## 2022-12-03 DIAGNOSIS — M75121 Complete rotator cuff tear or rupture of right shoulder, not specified as traumatic: Secondary | ICD-10-CM | POA: Diagnosis not present

## 2022-12-03 DIAGNOSIS — M6281 Muscle weakness (generalized): Secondary | ICD-10-CM | POA: Diagnosis not present

## 2022-12-07 LAB — CYTOLOGY - PAP
Comment: NEGATIVE
Diagnosis: NEGATIVE
High risk HPV: NEGATIVE

## 2023-03-05 NOTE — Patient Instructions (Signed)
Be Involved in Your Health Care:  Taking Medications When medications are taken as directed, they can greatly improve your health. But if they are not taken as instructed, they may not work. In some cases, not taking them correctly can be harmful. To help ensure your treatment remains effective and safe, understand your medications and how to take them.  Your lab results, notes and after visit summary will be available on My Chart. We strongly encourage you to use this feature. If lab results are abnormal the clinic will contact you with the appropriate steps. If the clinic does not contact you assume the results are satisfactory. You can always see your results on My Chart. If you have questions regarding your condition, please contact the clinic during office hours. You can also ask questions on My Chart.  We at [facility] are grateful that you chose us to provide care. We strive to provide excellent and compassionate care and are always looking for feedback. If you get a survey from the clinic please complete this.    Preventing High Cholesterol Cholesterol is a white, waxy substance similar to fat that the human body needs to help build cells. The liver makes all the cholesterol that a person's body needs. Having high cholesterol (hypercholesterolemia) increases your risk for heart disease and stroke. Extra or excess cholesterol comes from the food that you eat. High cholesterol can often be prevented with diet and lifestyle changes. If you already have high cholesterol, you can control it with diet, lifestyle changes, and medicines. How can high cholesterol affect me? If you have high cholesterol, fatty deposits (plaques) may build up on the walls of your blood vessels. The blood vessels that carry blood away from your heart are called arteries. Plaques make the arteries narrower and stiffer. This in turn can: Restrict or block blood flow and cause blood clots to form. Increase  your risk for heart attack and stroke. What can increase my risk for high cholesterol? This condition is more likely to develop in people who: Eat foods that are high in saturated fat or cholesterol. Saturated fat is mostly found in foods that come from animal sources. Are overweight. Are not getting enough exercise. Use products that contain nicotine or tobacco, such as cigarettes, e-cigarettes, and chewing tobacco. Have a family history of high cholesterol (familial hypercholesterolemia). What actions can I take to prevent this? Nutrition  Eat less saturated fat. Avoid trans fats (partially hydrogenated oils). These are often found in margarine and in some baked goods, fried foods, and snacks bought in packages. Avoid precooked or cured meat, such as bacon, sausages, or meat loaves. Avoid foods and drinks that have added sugars. Eat more fruits, vegetables, and whole grains. Choose healthy sources of protein, such as fish, poultry, lean cuts of red meat, beans, peas, lentils, and nuts. Choose healthy sources of fat, such as: Nuts. Vegetable oils, especially olive oil. Fish that have healthy fats, such as omega-3 fatty acids. These fish include mackerel or salmon. Lifestyle Lose weight if you are overweight. Maintaining a healthy body mass index (BMI) can help prevent or control high cholesterol. It can also lower your risk for diabetes and high blood pressure. Ask your health care provider to help you with a diet and exercise plan to lose weight safely. Do not use any products that contain nicotine or tobacco. These products include cigarettes, chewing tobacco, and vaping devices, such as e-cigarettes. If you need help quitting, ask your health care provider. Alcohol   use Do not drink alcohol if: Your health care provider tells you not to drink. You are pregnant, may be pregnant, or are planning to become pregnant. If you drink alcohol: Limit how much you have to: 0-1 drink a day for  women. 0-2 drinks a day for men. Know how much alcohol is in your drink. In the [place] one drink equals one 12 oz bottle of beer (355 mL), one 5 oz glass of wine (148 mL), or one 1 oz glass of hard liquor (44 mL). Activity  Get enough exercise. Do exercises as told by your health care provider. Each week, do at least 150 minutes of exercise that takes a medium level of effort (moderate-intensity exercise). This kind of exercise: Makes your heart beat faster while allowing you to still be able to talk. Can be done in short sessions several times a day or longer sessions a few times a week. For example, on 5 days each week, you could walk fast or ride your bike 3 times a day for 10 minutes each time. Medicines Your health care provider may recommend medicines to help lower cholesterol. This may be a medicine to lower the amount of cholesterol that your liver makes. You may need medicine if: Diet and lifestyle changes have not lowered your cholesterol enough. You have high cholesterol and other risk factors for heart disease or stroke. Take over-the-counter and prescription medicines only as told by your health care provider. General information Manage your risk factors for high cholesterol. Talk with your health care provider about all your risk factors and how to lower your risk. Manage other conditions that you have, such as diabetes or high blood pressure (hypertension). Have blood tests to check your cholesterol levels at regular points in time as told by your health care provider. Keep all follow-up visits. This is important. Where to find more information American Heart Association: www.heart.org National Heart, Lung, and Blood Institute: www.nhlbi.nih.gov Summary High cholesterol increases your risk for heart disease and stroke. By keeping your cholesterol level low, you can reduce your risk for these conditions. High cholesterol can often be prevented with diet and lifestyle  changes. Work with your health care provider to manage your risk factors, and have your blood tested regularly. This information is not intended to replace advice given to you by your health care provider. Make sure you discuss any questions you have with your health care provider. Document Revised: 05/14/2022 Document Reviewed: 12/15/2020 Elsevier Patient Education  2023 Elsevier Inc.  

## 2023-03-08 ENCOUNTER — Ambulatory Visit (INDEPENDENT_AMBULATORY_CARE_PROVIDER_SITE_OTHER): Payer: Medicare Other | Admitting: Nurse Practitioner

## 2023-03-08 ENCOUNTER — Encounter: Payer: Self-pay | Admitting: Nurse Practitioner

## 2023-03-08 VITALS — BP 114/75 | HR 60 | Temp 98.0°F | Ht 62.52 in | Wt 163.6 lb

## 2023-03-08 DIAGNOSIS — R030 Elevated blood-pressure reading, without diagnosis of hypertension: Secondary | ICD-10-CM | POA: Diagnosis not present

## 2023-03-08 DIAGNOSIS — M8588 Other specified disorders of bone density and structure, other site: Secondary | ICD-10-CM | POA: Diagnosis not present

## 2023-03-08 DIAGNOSIS — Z6828 Body mass index (BMI) 28.0-28.9, adult: Secondary | ICD-10-CM

## 2023-03-08 DIAGNOSIS — I700 Atherosclerosis of aorta: Secondary | ICD-10-CM

## 2023-03-08 DIAGNOSIS — R7989 Other specified abnormal findings of blood chemistry: Secondary | ICD-10-CM

## 2023-03-08 DIAGNOSIS — E7800 Pure hypercholesterolemia, unspecified: Secondary | ICD-10-CM

## 2023-03-08 NOTE — Assessment & Plan Note (Signed)
Stable with BP at goal.  Continue diet focus and regular activity.  Labs today. 

## 2023-03-08 NOTE — Assessment & Plan Note (Signed)
Chronic, aortic atherosclerosis noted on lumbar imaging. She is hesitant to start statins and is currently focusing on diet, exercise, and taking an omega 3 supplement. Will check lipid panel today.  ASCVD 8.9%.

## 2023-03-08 NOTE — Assessment & Plan Note (Signed)
Chronic.  Noted on lumbar imaging in October 2019.  Continue focus on healthy diet and exercise for prevention.  Does not wish to start statin or ASA. 

## 2023-03-08 NOTE — Progress Notes (Signed)
BP 114/75   Pulse 60   Temp 98 F (36.7 C) (Oral)   Ht 5' 2.52" (1.588 m)   Wt 163 lb 9.6 oz (74.2 kg)   LMP  (LMP Unknown)   SpO2 98%   BMI 29.43 kg/m    Subjective:    Patient ID: Breanna Davidson, female    DOB: [birth year], 72 y.o.   MRN: [id number]  HPI: Breanna Davidson is a 72 y.o. female  Chief Complaint  Patient presents with   Osteopenia   Hyperlipidemia   HYPERLIPIDEMIA She refuses statin therapy -- takes red yeast rice and fish oil.  Aortic atherosclerosis noted on lumbar imaging 08/02/2018.   Hyperlipidemia status: good compliance Medication compliance: good compliance Past cholesterol meds: none Supplements: fish oil and red yeast rice Aspirin:  no The 10-year ASCVD risk score (Arnett DK, et al., 2019) is: 8.9%   Values used to calculate the score:     Age: 75 years     Sex: Female     Is Non-Hispanic African American: No     Diabetic: No     Tobacco smoker: No     Systolic Blood Pressure: 114 mmHg     Is BP treated: No     HDL Cholesterol: 70 mg/dL     Total Cholesterol: 273 mg/dL Chest pain:  no Coronary artery disease:  no Family history CAD: yes -- father lived until 55 with higher levels and no treatment Family history early CAD:  no   OSTEOPENIA Last DEXA with osteopenia noted, 10/15/21.  Does take Vitamin D 2000 units daily.  No recent falls or fractures. Satisfied with current treatment?: yes Adequate calcium & vitamin D: yes Weight bearing exercises: yes   Relevant past medical, surgical, family and social history reviewed and updated as indicated. Interim medical history since our last visit reviewed. Allergies and medications reviewed and updated.  Review of Systems  Constitutional:  Negative for activity change, appetite change, diaphoresis, fatigue and fever.  Respiratory:  Negative for cough, chest tightness and shortness of breath.   Cardiovascular:  Negative for chest pain, palpitations and leg swelling.  Gastrointestinal:  Negative.   Neurological: Negative.   Psychiatric/Behavioral: Negative.     Per HPI unless specifically indicated above     Objective:    BP 114/75   Pulse 60   Temp 98 F (36.7 C) (Oral)   Ht 5' 2.52" (1.588 m)   Wt 163 lb 9.6 oz (74.2 kg)   LMP  (LMP Unknown)   SpO2 98%   BMI 29.43 kg/m   Wt Readings from Last 3 Encounters:  03/08/23 163 lb 9.6 oz (74.2 kg)  12/01/22 161 lb 12.8 oz (73.4 kg)  09/07/22 160 lb 4.8 oz (72.7 kg)    Physical Exam Vitals and nursing note reviewed.  Constitutional:      General: She is awake. She is not in acute distress.    Appearance: She is well-developed and well-groomed. She is obese. She is not ill-appearing or toxic-appearing.  HENT:     Head: Normocephalic.     Right Ear: Hearing, ear canal and external ear normal.     Left Ear: Hearing, ear canal and external ear normal.  Eyes:     General: Lids are normal.        Right eye: No discharge.        Left eye: No discharge.     Conjunctiva/sclera: Conjunctivae normal.     Pupils: Pupils are equal, round,  and reactive to light.  Neck:     Thyroid: No thyromegaly.     Vascular: No carotid bruit.  Cardiovascular:     Rate and Rhythm: Normal rate and regular rhythm.     Heart sounds: Normal heart sounds. No murmur heard.    No gallop.  Pulmonary:     Effort: Pulmonary effort is normal. No accessory muscle usage or respiratory distress.     Breath sounds: Normal breath sounds.  Abdominal:     General: Bowel sounds are normal.     Palpations: Abdomen is soft.  Musculoskeletal:     Cervical back: Neck supple. No rigidity. No pain with movement or spinous process tenderness. Normal range of motion.     Right lower leg: No edema.     Left lower leg: No edema.  Lymphadenopathy:     Cervical: No cervical adenopathy.  Skin:    General: Skin is warm and dry.  Neurological:     Mental Status: She is alert and oriented to person, place, and time.  Psychiatric:        Attention and  Perception: Attention normal.        Mood and Affect: Mood normal.        Behavior: Behavior normal. Behavior is cooperative.        Thought Content: Thought content normal.        Judgment: Judgment normal.    Results for orders placed or performed in visit on 12/01/22  Cytology - PAP  Result Value Ref Range   High risk HPV Negative    Adequacy      Satisfactory for evaluation; transformation zone component PRESENT.   Diagnosis      - Negative for intraepithelial lesion or malignancy (NILM)   Comment Normal Reference Range HPV - Negative       Assessment & Plan:   Problem List Items Addressed This Visit       Cardiovascular and Mediastinum   Aortic atherosclerosis (HCC) - Primary    Chronic.  Noted on lumbar imaging in October 2019.  Continue focus on healthy diet and exercise for prevention.  Does not wish to start statin or ASA.      Relevant Orders   Comprehensive metabolic panel   Lipid Panel w/o Chol/HDL Ratio     Musculoskeletal and Integument   Osteopenia    Chronic, ongoing.  Recent DEXA remains stable, no worsening.  Continue supplements and check labs today.  Repeat DEXA in 2027.        Other   BMI 28.0-28.9,adult    BMI 29.43.  Recommended eating smaller high protein, low fat meals more frequently and exercising 30 mins a day 5 times a week with a goal of 10-15lb weight loss in the next 3 months. Patient voiced their understanding and motivation to adhere to these recommendations.       Elevated blood-pressure reading, without diagnosis of hypertension    Stable with BP at goal.  Continue diet focus and regular activity.  Labs today.      Relevant Orders   Comprehensive metabolic panel   Elevated LFTs    Recheck on labs today, recent labs improved.      Relevant Orders   Comprehensive metabolic panel   Hypercholesteremia    Chronic, aortic atherosclerosis noted on lumbar imaging. She is hesitant to start statins and is currently focusing on diet,  exercise, and taking an omega 3 supplement. Will check lipid panel today.  ASCVD 8.9%.  Relevant Orders   Comprehensive metabolic panel   Lipid Panel w/o Chol/HDL Ratio     Follow up plan: Return in about 6 months (around 09/08/2023) for Annual physical after 09/08/23.

## 2023-03-08 NOTE — Assessment & Plan Note (Signed)
BMI 29.43. Recommended eating smaller high protein, low fat meals more frequently and exercising 30 mins a day 5 times a week with a goal of 10-15lb weight loss in the next 3 months. Patient voiced their understanding and motivation to adhere to these recommendations.  

## 2023-03-08 NOTE — Assessment & Plan Note (Signed)
Chronic, ongoing.  Recent DEXA remains stable, no worsening.  Continue supplements and check labs today.  Repeat DEXA in 2027. 

## 2023-03-08 NOTE — Assessment & Plan Note (Signed)
Recheck on labs today, recent labs improved. ?

## 2023-03-09 LAB — COMPREHENSIVE METABOLIC PANEL
ALT: 17 IU/L (ref 0–32)
AST: 20 IU/L (ref 0–40)
Albumin/Globulin Ratio: 2 (ref 1.2–2.2)
Albumin: 4.1 g/dL (ref 3.8–4.8)
Alkaline Phosphatase: 65 IU/L (ref 44–121)
BUN/Creatinine Ratio: 22 (ref 12–28)
BUN: 18 mg/dL (ref 8–27)
Bilirubin Total: 0.4 mg/dL (ref 0.0–1.2)
CO2: 24 mmol/L (ref 20–29)
Calcium: 9.5 mg/dL (ref 8.7–10.3)
Chloride: 104 mmol/L (ref 96–106)
Creatinine, Ser: 0.82 mg/dL (ref 0.57–1.00)
Globulin, Total: 2.1 g/dL (ref 1.5–4.5)
Glucose: 90 mg/dL (ref 70–99)
Potassium: 4.5 mmol/L (ref 3.5–5.2)
Sodium: 140 mmol/L (ref 134–144)
Total Protein: 6.2 g/dL (ref 6.0–8.5)
eGFR: 76 mL/min/{1.73_m2} (ref >59)

## 2023-03-09 LAB — LIPID PANEL W/O CHOL/HDL RATIO
Cholesterol, Total: 286 mg/dL — ABNORMAL HIGH (ref 100–199)
HDL: 83 mg/dL (ref >39)
LDL Chol Calc (NIH): 189 mg/dL — ABNORMAL HIGH (ref 0–99)
Triglycerides: 88 mg/dL (ref 0–149)
VLDL Cholesterol Cal: 14 mg/dL (ref 5–40)

## 2023-03-09 NOTE — Progress Notes (Signed)
Contacted via MyChart   Good evening Angelita, your labs have returned: - Kidney function, creatinine and eGFR, remains normal, as is liver function, AST and ALT.  - Cholesterol levels remain elevated, I do recommend statin therapy, but know your preference is more natural approach with supplements. Continue these for now.  Have a great trip!!! Keep being amazing!!  Thank you for allowing me to participate in your care.  I appreciate you. Kindest regards, Celia Friedland

## 2023-03-23 DIAGNOSIS — H40053 Ocular hypertension, bilateral: Secondary | ICD-10-CM | POA: Diagnosis not present

## 2023-03-23 DIAGNOSIS — H40039 Anatomical narrow angle, unspecified eye: Secondary | ICD-10-CM | POA: Diagnosis not present

## 2023-09-04 NOTE — Patient Instructions (Signed)
Wegovy or Zepbound  Focus on DASH diet for high blood pressure or Mediterranean diet  Be Involved in Caring For Your Health:  Taking Medications When medications are taken as directed, they can greatly improve your health. But if they are not taken as prescribed, they may not work. In some cases, not taking them correctly can be harmful. To help ensure your treatment remains effective and safe, understand your medications and how to take them. Bring your medications to each visit for review by your provider.  Your lab results, notes, and after visit summary will be available on My Chart. We strongly encourage you to use this feature. If lab results are abnormal the clinic will contact you with the appropriate steps. If the clinic does not contact you assume the results are satisfactory. You can always view your results on My Chart. If you have questions regarding your health or results, please contact the clinic during office hours. You can also ask questions on My Chart.  We at [facility] are grateful that you chose us to provide your care. We strive to provide evidence-based and compassionate care and are always looking for feedback. If you get a survey from the clinic please complete this so we can hear your opinions.  Preventing High Cholesterol Cholesterol is a white, waxy substance similar to fat that the human body needs to help build cells. The liver makes all the cholesterol that a person's body needs. Having high cholesterol (hypercholesterolemia) increases your risk for heart disease and stroke. Extra or excess cholesterol comes from the food that you eat. High cholesterol can often be prevented with diet and lifestyle changes. If you already have high cholesterol, you can control it with diet, lifestyle changes, and medicines. How can high cholesterol affect me? If you have high cholesterol, fatty deposits (plaques) may build up on the walls of your blood vessels. The blood  vessels that carry blood away from your heart are called arteries. Plaques make the arteries narrower and stiffer. This in turn can: Restrict or block blood flow and cause blood clots to form. Increase your risk for heart attack and stroke. What can increase my risk for high cholesterol? This condition is more likely to develop in people who: Eat foods that are high in saturated fat or cholesterol. Saturated fat is mostly found in foods that come from animal sources. Are overweight. Are not getting enough exercise. Use products that contain nicotine or tobacco, such as cigarettes, e-cigarettes, and chewing tobacco. Have a family history of high cholesterol (familial hypercholesterolemia). What actions can I take to prevent this? Nutrition  Eat less saturated fat. Avoid trans fats (partially hydrogenated oils). These are often found in margarine and in some baked goods, fried foods, and snacks bought in packages. Avoid precooked or cured meat, such as bacon, sausages, or meat loaves. Avoid foods and drinks that have added sugars. Eat more fruits, vegetables, and whole grains. Choose healthy sources of protein, such as fish, poultry, lean cuts of red meat, beans, peas, lentils, and nuts. Choose healthy sources of fat, such as: Nuts. Vegetable oils, especially olive oil. Fish that have healthy fats, such as omega-3 fatty acids. These fish include mackerel or salmon. Lifestyle Lose weight if you are overweight. Maintaining a healthy body mass index (BMI) can help prevent or control high cholesterol. It can also lower your risk for diabetes and high blood pressure. Ask your health care provider to help you with a diet and exercise plan to lose   weight safely. Do not use any products that contain nicotine or tobacco. These products include cigarettes, chewing tobacco, and vaping devices, such as e-cigarettes. If you need help quitting, ask your health care provider. Alcohol use Do not drink  alcohol if: Your health care provider tells you not to drink. You are pregnant, may be pregnant, or are planning to become pregnant. If you drink alcohol: Limit how much you have to: 0-1 drink a day for women. 0-2 drinks a day for men. Know how much alcohol is in your drink. In the [place] one drink equals one 12 oz bottle of beer (355 mL), one 5 oz glass of wine (148 mL), or one 1 oz glass of hard liquor (44 mL). Activity  Get enough exercise. Do exercises as told by your health care provider. Each week, do at least 150 minutes of exercise that takes a medium level of effort (moderate-intensity exercise). This kind of exercise: Makes your heart beat faster while allowing you to still be able to talk. Can be done in short sessions several times a day or longer sessions a few times a week. For example, on 5 days each week, you could walk fast or ride your bike 3 times a day for 10 minutes each time. Medicines Your health care provider may recommend medicines to help lower cholesterol. This may be a medicine to lower the amount of cholesterol that your liver makes. You may need medicine if: Diet and lifestyle changes have not lowered your cholesterol enough. You have high cholesterol and other risk factors for heart disease or stroke. Take over-the-counter and prescription medicines only as told by your health care provider. General information Manage your risk factors for high cholesterol. Talk with your health care provider about all your risk factors and how to lower your risk. Manage other conditions that you have, such as diabetes or high blood pressure (hypertension). Have blood tests to check your cholesterol levels at regular points in time as told by your health care provider. Keep all follow-up visits. This is important. Where to find more information American Heart Association: www.heart.org National Heart, Lung, and Blood Institute: www.nhlbi.nih.gov Summary High cholesterol  increases your risk for heart disease and stroke. By keeping your cholesterol level low, you can reduce your risk for these conditions. High cholesterol can often be prevented with diet and lifestyle changes. Work with your health care provider to manage your risk factors, and have your blood tested regularly. This information is not intended to replace advice given to you by your health care provider. Make sure you discuss any questions you have with your health care provider. Document Revised: 05/14/2022 Document Reviewed: 12/15/2020 Elsevier Patient Education  2024 [employer]  

## 2023-09-06 ENCOUNTER — Other Ambulatory Visit: Payer: Self-pay | Admitting: Nurse Practitioner

## 2023-09-06 DIAGNOSIS — Z1231 Encounter for screening mammogram for malignant neoplasm of breast: Secondary | ICD-10-CM

## 2023-09-09 ENCOUNTER — Ambulatory Visit (INDEPENDENT_AMBULATORY_CARE_PROVIDER_SITE_OTHER): Payer: Medicare Other | Admitting: Nurse Practitioner

## 2023-09-09 ENCOUNTER — Encounter: Payer: Self-pay | Admitting: Nurse Practitioner

## 2023-09-09 VITALS — BP 131/76 | HR 54 | Temp 98.9°F | Ht 62.1 in | Wt 164.2 lb

## 2023-09-09 DIAGNOSIS — Z6829 Body mass index (BMI) 29.0-29.9, adult: Secondary | ICD-10-CM

## 2023-09-09 DIAGNOSIS — Z1211 Encounter for screening for malignant neoplasm of colon: Secondary | ICD-10-CM | POA: Diagnosis not present

## 2023-09-09 DIAGNOSIS — Z23 Encounter for immunization: Secondary | ICD-10-CM

## 2023-09-09 DIAGNOSIS — E7800 Pure hypercholesterolemia, unspecified: Secondary | ICD-10-CM | POA: Diagnosis not present

## 2023-09-09 DIAGNOSIS — R030 Elevated blood-pressure reading, without diagnosis of hypertension: Secondary | ICD-10-CM

## 2023-09-09 DIAGNOSIS — I700 Atherosclerosis of aorta: Secondary | ICD-10-CM

## 2023-09-09 DIAGNOSIS — M8588 Other specified disorders of bone density and structure, other site: Secondary | ICD-10-CM | POA: Diagnosis not present

## 2023-09-09 DIAGNOSIS — R7989 Other specified abnormal findings of blood chemistry: Secondary | ICD-10-CM | POA: Diagnosis not present

## 2023-09-09 DIAGNOSIS — Z0000 Encounter for general adult medical examination without abnormal findings: Secondary | ICD-10-CM | POA: Diagnosis not present

## 2023-09-09 DIAGNOSIS — Z6828 Body mass index (BMI) 28.0-28.9, adult: Secondary | ICD-10-CM

## 2023-09-09 NOTE — Assessment & Plan Note (Signed)
Recheck on labs today, recent labs improved. 

## 2023-09-09 NOTE — Assessment & Plan Note (Signed)
Chronic.  Noted on lumbar imaging in October 2019.  Continue focus on healthy diet and exercise for prevention.  Does not wish to start statin or ASA. 

## 2023-09-09 NOTE — Assessment & Plan Note (Signed)
Chronic, ongoing.  Recent DEXA remains stable, no worsening.  Continue supplements and check labs today.  Repeat DEXA in 2027.

## 2023-09-09 NOTE — Assessment & Plan Note (Signed)
BMI 29.94.  Recommended eating smaller high protein, low fat meals more frequently and exercising 30 mins a day 5 times a week with a goal of 10-15lb weight loss in the next 3 months. Patient voiced their understanding and motivation to adhere to these recommendations.

## 2023-09-09 NOTE — Assessment & Plan Note (Signed)
Chronic, aortic atherosclerosis noted on lumbar imaging. She is hesitant to start statins and is currently focusing on diet, exercise, and taking an omega 3 supplement. Will check lipid panel today.  ASCVD 12.6%.

## 2023-09-09 NOTE — Progress Notes (Signed)
BP 131/76   Pulse (!) 54   Temp 98.9 F (37.2 C) (Oral)   Ht 5' 2.1" (1.577 m)   Wt 164 lb 3.2 oz (74.5 kg)   LMP  (LMP Unknown)   SpO2 98%   BMI 29.94 kg/m    Subjective:    Patient ID: Breanna Davidson, female    DOB: November 17, 1950, 72 y.o.   MRN: [id number]  HPI: Breanna Davidson is a 72 y.o. female presenting on 09/09/2023 for Annual physical exam. Current medical complaints include:none  She currently lives with: husband Menopausal Symptoms: no  HYPERLIPIDEMIA She refuses statin therapy.  Takes red yeast rice and fish oil.  Aortic atherosclerosis noted on lumbar imaging 08/02/2018.  History of elevations in LFTs and some mild elevation ESR/CRP with cervical neck issues. Hyperlipidemia status: good compliance Medication compliance: good compliance Past cholesterol meds: none Supplements: fish oil and red yeast rice Aspirin:  no The 10-year ASCVD risk score (Arnett DK, et al., 2019) is: 12.6%   Values used to calculate the score:     Age: 35 years     Sex: Female     Is Non-Hispanic African American: No     Diabetic: No     Tobacco smoker: No     Systolic Blood Pressure: 131 mmHg     Is BP treated: No     HDL Cholesterol: 83 mg/dL     Total Cholesterol: 286 mg/dL Chest pain:  no Coronary artery disease:  no Family history CAD:  yes -- father lived until 47 with higher levels and no treatment Family history early CAD:  no   OSTEOPENIA Noted on DEXA 10/15/21. Satisfied with current treatment?: yes Adequate calcium & vitamin D: yes Weight bearing exercises: yes   A voluntary discussion about advance care planning including the explanation and discussion of advance directives was extensively discussed  with the patient for 15 minutes with patient. Explanation about the health care proxy and Living will was reviewed and packet with forms with explanation of how to fill them out was given.  During this discussion, the patient was able to identify a health care proxy as  husband and plans to fill out the paperwork required.  Patient was offered a separate Advance Care Planning visit for further assistance with forms.  She has POA scanned into hospital chart per her report.  Depression Screen done today and results listed below:     09/09/2023    8:53 AM 03/08/2023    8:23 AM 10/04/2022    8:41 AM 09/07/2022    1:10 PM 01/19/2022    9:32 AM  Depression screen PHQ 2/9  Decreased Interest 0 0 0 0 0  Down, Depressed, Hopeless 0 0 0 0 0  PHQ - 2 Score 0 0 0 0 0  Altered sleeping 0 0 1 0 0  Tired, decreased energy 1 1 1  0 0  Change in appetite 0 0 0 0 0  Feeling bad or failure about yourself  0 0 0 0 0  Trouble concentrating 0 0 0 0 0  Moving slowly or fidgety/restless 0 0 0 0 0  Suicidal thoughts 0 0 0 0 0  PHQ-9 Score 1 1 2  0 0  Difficult doing work/chores Not difficult at all Not difficult at all Not difficult at all Not difficult at all Not difficult at all      09/09/2023    8:54 AM 03/08/2023    8:23 AM 09/07/2022    1:10  PM 05/17/2019    9:22 AM  GAD 7 : Generalized Anxiety Score  Nervous, Anxious, on Edge 0 0 0 0  Control/stop worrying 0 0 0 0  Worry too much - different things 0 0 0 0  Trouble relaxing 0 0 0 0  Restless 0 0 0 0  Easily annoyed or irritable 0 0 0 0  Afraid - awful might happen 0 0 0 0  Total GAD 7 Score 0 0 0 0  Anxiety Difficulty Not difficult at all Not difficult at all Not difficult at all       09/07/2022    1:16 PM 10/04/2022    8:33 AM 10/04/2022    8:35 AM 03/08/2023    8:22 AM 09/09/2023    8:53 AM  Fall Risk  Falls in the past year? 0 0 0 0 0  Was there an injury with Fall? 0 0 0 0 0  Fall Risk Category Calculator 0 0 0 0 0  Fall Risk Category (Retired) Low Low Low    (RETIRED) Patient Fall Risk Level Low fall risk  Low fall risk    Patient at Risk for Falls Due to No Fall Risks    No Fall Risks  Fall risk Follow up Falls prevention discussed  Falls evaluation completed;Education provided;Falls prevention  discussed Falls evaluation completed Falls evaluation completed    Functional Status Survey: Is the patient deaf or have difficulty hearing?: No Does the patient have difficulty seeing, even when wearing glasses/contacts?: No Does the patient have difficulty concentrating, remembering, or making decisions?: Yes Does the patient have difficulty walking or climbing stairs?: No Does the patient have difficulty dressing or bathing?: No Does the patient have difficulty doing errands alone such as visiting a doctor's office or shopping?: No   Past Medical History:  Past Medical History:  Diagnosis Date   Allergy    Chest pain    2017 ED VISIT FOR CHEST PAIN , R/O AS GASTRIC CAUSE    Depression    per NP notes, on cymbalta   Headache    per patient very rare sometimes when not well rested   Hyperlipidemia    Increased pressure in the eye    "THEY DID SURGERY ON MY EYES TO REDUCE THE PRESSURE , THEY MADE TWO HOLES IN MY EYES "   Osteopenia    Spinal stenosis    LUMBAR    Vertigo    IMPROVED BUT HAS OCCASIONAL SX    Surgical History:  Past Surgical History:  Procedure Laterality Date   APPENDECTOMY     BACK SURGERY  11/2019   CESAREAN SECTION     COLONOSCOPY WITH PROPOFOL N/A 08/26/2020   Procedure: COLONOSCOPY WITH PROPOFOL;  Surgeon: Pasty Spillers, MD;  Location: [facility] ENDOSCOPY;  Service: Endoscopy;  Laterality: N/A;   DILATION AND CURETTAGE OF UTERUS     EYE SURGERY     "THEY DID SURGERY ON MY EYES TO REDUCE THE PRESSURE , THEY MADE TWO HOLES IN MY EYES "   JOINT REPLACEMENT     REDUCTION MAMMAPLASTY  1988   SPINE SURGERY     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   CALCIUM-MAGNESIUM PO Take 1 tablet by mouth daily.   Cholecalciferol (VITAMIN D) 50 MCG (2000 UT) CAPS Take 2,000 Units by mouth daily.   Coenzyme Q10-Red Yeast Rice (CO Q-10 PLUS RED YEAST RICE PO) Take 1 capsule by mouth daily.   dorzolamide-timolol (COSOPT) 2-0.5 % ophthalmic solution Place 1 drop into both eyes 2 (two) times daily.   Omega-3 Fatty Acids (FISH OIL) 1000 MG CAPS Take 1,000 mg by mouth daily.   No current facility-administered medications on file prior to visit.    Allergies:  Allergies  Allergen Reactions   Combigan [Brimonidine Tartrate-Timolol] Other (See Comments)    Irritated eyes, redness and itching   Prednisone Other (See Comments)    Altered Mental Status    Social History:  Social History   Socioeconomic History   Marital status: Married    Spouse name: Not on file   Number of children: Not on file   Years of education: college    Highest education level: Associate degree: occupational, Scientist, product/process development, or vocational program  Occupational History   Occupation: retired   Tobacco Use   Smoking status: Never   Smokeless tobacco: Never  Vaping Use   Vaping status: Never Used  Substance and Sexual Activity   Alcohol use: Not Currently    Alcohol/week: 1.0 standard drink of alcohol    Types: 1 Shots of liquor per week   Drug use: No   Sexual activity: Not Currently  Other Topics Concern   Not on file  Social History Narrative   Not on file   Social Determinants of Health   Financial Resource Strain: Low Risk  (03/07/2023)   Overall Financial Resource Strain (CARDIA)    Difficulty of Paying Living Expenses: Not hard at all  Food Insecurity: No Food Insecurity (03/07/2023)   Hunger Vital Sign    Worried About Running Out of Food in the Last Year: Never true    Ran Out of Food in the Last Year: Never true  Transportation Needs: No Transportation Needs (03/07/2023)    PRAPARE - Administrator, Civil Service (Medical): No    Lack of Transportation (Non-Medical): No  Physical Activity: Sufficiently Active (03/07/2023)   Exercise Vital Sign    Days of Exercise per Week: 5 days    Minutes of Exercise per Session: 30 min  Stress: No Stress Concern Present (03/07/2023)   [employer] of Occupational Health - Occupational Stress Questionnaire    Feeling of Stress : Not at all  Social Connections: Socially Integrated (03/07/2023)   Social Connection and Isolation Panel [NHANES]    Frequency of Communication with Friends and Family: Three times a week    Frequency of Social Gatherings with Friends and Family: Twice a week    Attends Religious Services: 1 to 4 times per year    Active Member of [employer] or Organizations: No    Attends Engineer, structural: 1 to 4 times per year    Marital Status: Married  Catering manager Violence: Not At Risk (10/04/2022)   Humiliation, Afraid, Rape, and Kick questionnaire    Fear of Current or Ex-Partner: No    Emotionally Abused: No    Physically Abused: No    Sexually Abused: No   Social History   Tobacco Use  Smoking Status Never  Smokeless Tobacco Never   Social History   Substance and Sexual Activity  Alcohol Use Not  Currently   Alcohol/week: 1.0 standard drink of alcohol   Types: 1 Shots of liquor per week    Family History:  Family History  Problem Relation Age of Onset   Breast cancer Mother 66       and again at 44   Lung disease Mother    Cancer Mother    Emphysema Mother    CAD Father    Hyperlipidemia Sister    Mental illness Daughter        bipolar   Cystic fibrosis Daughter    Breast cancer Maternal Aunt    Breast cancer Maternal Grandmother     Past medical history, surgical history, medications, allergies, family history and social history reviewed with patient today and changes made to appropriate areas of the chart.   ROS All other ROS negative except what  is listed above and in the HPI.      Objective:    BP 131/76   Pulse (!) 54   Temp 98.9 F (37.2 C) (Oral)   Ht 5' 2.1" (1.577 m)   Wt 164 lb 3.2 oz (74.5 kg)   LMP  (LMP Unknown)   SpO2 98%   BMI 29.94 kg/m   Wt Readings from Last 3 Encounters:  09/09/23 164 lb 3.2 oz (74.5 kg)  03/08/23 163 lb 9.6 oz (74.2 kg)  12/01/22 161 lb 12.8 oz (73.4 kg)    Physical Exam Vitals and nursing note reviewed. Exam conducted with a chaperone present.  Constitutional:      General: She is awake. She is not in acute distress.    Appearance: She is well-developed and well-groomed. She is not ill-appearing or toxic-appearing.  HENT:     Head: Normocephalic and atraumatic.     Right Ear: Hearing, tympanic membrane, ear canal and external ear normal. No drainage.     Left Ear: Hearing, tympanic membrane, ear canal and external ear normal. No drainage.     Nose: Nose normal.     Right Sinus: No maxillary sinus tenderness or frontal sinus tenderness.     Left Sinus: No maxillary sinus tenderness or frontal sinus tenderness.     Mouth/Throat:     Mouth: Mucous membranes are moist.     Pharynx: Oropharynx is clear. Uvula midline. No pharyngeal swelling, oropharyngeal exudate or posterior oropharyngeal erythema.  Eyes:     General: Lids are normal.        Right eye: No discharge.        Left eye: No discharge.     Extraocular Movements: Extraocular movements intact.     Conjunctiva/sclera: Conjunctivae normal.     Pupils: Pupils are equal, round, and reactive to light.     Visual Fields: Right eye visual fields normal and left eye visual fields normal.  Neck:     Thyroid: No thyromegaly.     Vascular: No carotid bruit.     Trachea: Trachea normal.  Cardiovascular:     Rate and Rhythm: Normal rate and regular rhythm.     Heart sounds: Normal heart sounds. No murmur heard.    No gallop.  Pulmonary:     Effort: Pulmonary effort is normal. No accessory muscle usage or respiratory distress.      Breath sounds: Normal breath sounds.  Chest:  Breasts:    Right: Normal.     Left: Normal.  Abdominal:     General: Bowel sounds are normal.     Palpations: Abdomen is soft. There is no hepatomegaly or splenomegaly.  Tenderness: There is no abdominal tenderness.  Musculoskeletal:        General: Normal range of motion.     Cervical back: Normal range of motion and neck supple.     Right lower leg: No edema.     Left lower leg: No edema.  Lymphadenopathy:     Head:     Right side of head: No submental, submandibular, tonsillar, preauricular or posterior auricular adenopathy.     Left side of head: No submental, submandibular, tonsillar, preauricular or posterior auricular adenopathy.     Cervical: No cervical adenopathy.     Upper Body:     Right upper body: No supraclavicular, axillary or pectoral adenopathy.     Left upper body: No supraclavicular, axillary or pectoral adenopathy.  Skin:    General: Skin is warm and dry.     Capillary Refill: Capillary refill takes less than 2 seconds.     Findings: No rash.  Neurological:     Mental Status: She is alert and oriented to person, place, and time.     Gait: Gait is intact.     Deep Tendon Reflexes: Reflexes are normal and symmetric.     Reflex Scores:      Brachioradialis reflexes are 2+ on the right side and 2+ on the left side.      Patellar reflexes are 2+ on the right side and 2+ on the left side. Psychiatric:        Attention and Perception: Attention normal.        Mood and Affect: Mood normal.        Speech: Speech normal.        Behavior: Behavior normal. Behavior is cooperative.        Thought Content: Thought content normal.        Judgment: Judgment normal.       10/04/2022    8:39 AM 09/08/2020    2:38 PM 08/20/2019    9:31 AM 06/23/2018    8:17 AM 06/23/2017    8:30 AM  6CIT Screen  What Year? 0 points 0 points 0 points 0 points 0 points  What month? 0 points 0 points 0 points 0 points 0 points  What  time? 0 points 0 points 0 points 0 points 0 points  Count back from 20 0 points 0 points 0 points 0 points 0 points  Months in reverse 0 points 0 points 0 points 0 points 0 points  Repeat phrase 0 points 2 points 0 points 0 points 0 points  Total Score 0 points 2 points 0 points 0 points 0 points   Results for orders placed or performed in visit on 03/08/23  Comprehensive metabolic panel  Result Value Ref Range   Glucose 90 70 - 99 mg/dL   BUN 18 8 - 27 mg/dL   Creatinine, Ser 6.57 0.57 - 1.00 mg/dL   eGFR 76 >84 ON/GEX/5.28   BUN/Creatinine Ratio 22 12 - 28   Sodium 140 134 - 144 mmol/L   Potassium 4.5 3.5 - 5.2 mmol/L   Chloride 104 96 - 106 mmol/L   CO2 24 20 - 29 mmol/L   Calcium 9.5 8.7 - 10.3 mg/dL   Total Protein 6.2 6.0 - 8.5 g/dL   Albumin 4.1 3.8 - 4.8 g/dL   Globulin, Total 2.1 1.5 - 4.5 g/dL   Albumin/Globulin Ratio 2.0 1.2 - 2.2   Bilirubin Total 0.4 0.0 - 1.2 mg/dL   Alkaline Phosphatase 65 44 - 121  IU/L   AST 20 0 - 40 IU/L   ALT 17 0 - 32 IU/L  Lipid Panel w/o Chol/HDL Ratio  Result Value Ref Range   Cholesterol, Total 286 (H) 100 - 199 mg/dL   Triglycerides 88 0 - 149 mg/dL   HDL 83 >18 mg/dL   VLDL Cholesterol Cal 14 5 - 40 mg/dL   LDL Chol Calc (NIH) 841 (H) 0 - 99 mg/dL      Assessment & Plan:   Problem List Items Addressed This Visit       Cardiovascular and Mediastinum   Aortic atherosclerosis (HCC) - Primary    Chronic.  Noted on lumbar imaging in October 2019.  Continue focus on healthy diet and exercise for prevention.  Does not wish to start statin or ASA.      Relevant Orders   Comprehensive metabolic panel   Lipid Panel w/o Chol/HDL Ratio     Musculoskeletal and Integument   Osteopenia    Chronic, ongoing.  Recent DEXA remains stable, no worsening.  Continue supplements and check labs today.  Repeat DEXA in 2027.      Relevant Orders   VITAMIN D 25 Hydroxy (Vit-D Deficiency, Fractures)     Other   BMI 29.0-29.9,adult    BMI  29.94.  Recommended eating smaller high protein, low fat meals more frequently and exercising 30 mins a day 5 times a week with a goal of 10-15lb weight loss in the next 3 months. Patient voiced their understanding and motivation to adhere to these recommendations.       Elevated blood-pressure reading, without diagnosis of hypertension    Stable with BP at goal.  Continue diet focus and regular activity.  Labs today.      Relevant Orders   CBC with Differential/Platelet   TSH   Elevated LFTs    Recheck on labs today, recent labs improved.      Relevant Orders   Comprehensive metabolic panel   Hypercholesteremia    Chronic, aortic atherosclerosis noted on lumbar imaging. She is hesitant to start statins and is currently focusing on diet, exercise, and taking an omega 3 supplement. Will check lipid panel today.  ASCVD 12.6%.      Relevant Orders   Comprehensive metabolic panel   Lipid Panel w/o Chol/HDL Ratio   Other Visit Diagnoses     Flu vaccine need       Flu vaccine today, educated patient.   Relevant Orders   Flu Vaccine Trivalent High Dose (Fluad) (Completed)   Colon cancer screening       Referral to GI placed.   Relevant Orders   Ambulatory referral to Gastroenterology   Encounter for annual physical exam       Annual physical today with labs and health maintenance reviewed, discussed with patient.        Follow up plan: Return in about 6 months (around 03/08/2024) for HTN/HLD -- plus needs Medicare Wellness with nurse after 10/05/23.   LABORATORY TESTING:  - Pap smear: not applicable  IMMUNIZATIONS:   - Tdap: Tetanus vaccination status reviewed: last tetanus booster within 10 years. - Influenza: Provided today - Pneumovax: Up to date - Prevnar: Up to date - COVID: Up to date - HPV: Not applicable - Shingrix vaccine:  will do this in future  SCREENING: -Mammogram: Up to date  - Colonoscopy: Up to date  - Bone Density: Up to date  -Hearing Test: Not  applicable  -Spirometry: Not applicable   PATIENT  COUNSELING:   Advised to take 1 mg of folate supplement per day if capable of pregnancy.   Sexuality: Discussed sexually transmitted diseases, partner selection, use of condoms, avoidance of unintended pregnancy  and contraceptive alternatives.   Advised to avoid cigarette smoking.  I discussed with the patient that most people either abstain from alcohol or drink within safe limits (<=14/week and <=4 drinks/occasion for males, <=7/weeks and <= 3 drinks/occasion for females) and that the risk for alcohol disorders and other health effects rises proportionally with the number of drinks per week and how often a drinker exceeds daily limits.  Discussed cessation/primary prevention of drug use and availability of treatment for abuse.   Diet: Encouraged to adjust caloric intake to maintain  or achieve ideal body weight, to reduce intake of dietary saturated fat and total fat, to limit sodium intake by avoiding high sodium foods and not adding table salt, and to maintain adequate dietary potassium and calcium preferably from fresh fruits, vegetables, and low-fat dairy products.    Stressed the importance of regular exercise  Injury prevention: Discussed safety belts, safety helmets, smoke detector, smoking near bedding or upholstery.   Dental health: Discussed importance of regular tooth brushing, flossing, and dental visits.    NEXT PREVENTATIVE PHYSICAL DUE IN 1 YEAR. Return in about 6 months (around 03/08/2024) for HTN/HLD -- plus needs Medicare Wellness with nurse after 10/05/23.

## 2023-09-09 NOTE — Assessment & Plan Note (Signed)
Stable with BP at goal.  Continue diet focus and regular activity.  Labs today.

## 2023-09-10 LAB — CBC WITH DIFFERENTIAL/PLATELET
Basophils Absolute: 0 10*3/uL (ref 0.0–0.2)
Basos: 1 %
EOS (ABSOLUTE): 0.2 10*3/uL (ref 0.0–0.4)
Eos: 4 %
Hematocrit: 39 % (ref 34.0–46.6)
Hemoglobin: 12.8 g/dL (ref 11.1–15.9)
Immature Grans (Abs): 0 10*3/uL (ref 0.0–0.1)
Immature Granulocytes: 0 %
Lymphocytes Absolute: 1.8 10*3/uL (ref 0.7–3.1)
Lymphs: 31 %
MCH: 30.4 pg (ref 26.6–33.0)
MCHC: 32.8 g/dL (ref 31.5–35.7)
MCV: 93 fL (ref 79–97)
Monocytes Absolute: 0.5 10*3/uL (ref 0.1–0.9)
Monocytes: 8 %
Neutrophils Absolute: 3.1 10*3/uL (ref 1.4–7.0)
Neutrophils: 56 %
Platelets: 296 10*3/uL (ref 150–450)
RBC: 4.21 x10E6/uL (ref 3.77–5.28)
RDW: 13 % (ref 11.7–15.4)
WBC: 5.6 10*3/uL (ref 3.4–10.8)

## 2023-09-10 LAB — COMPREHENSIVE METABOLIC PANEL
ALT: 16 [IU]/L (ref 0–32)
AST: 19 [IU]/L (ref 0–40)
Albumin: 4.2 g/dL (ref 3.8–4.8)
Alkaline Phosphatase: 67 [IU]/L (ref 44–121)
BUN/Creatinine Ratio: 22 (ref 12–28)
BUN: 17 mg/dL (ref 8–27)
Bilirubin Total: 0.3 mg/dL (ref 0.0–1.2)
CO2: 24 mmol/L (ref 20–29)
Calcium: 9.1 mg/dL (ref 8.7–10.3)
Chloride: 104 mmol/L (ref 96–106)
Creatinine, Ser: 0.76 mg/dL (ref 0.57–1.00)
Globulin, Total: 2 g/dL (ref 1.5–4.5)
Glucose: 87 mg/dL (ref 70–99)
Potassium: 4.3 mmol/L (ref 3.5–5.2)
Sodium: 140 mmol/L (ref 134–144)
Total Protein: 6.2 g/dL (ref 6.0–8.5)
eGFR: 83 mL/min/{1.73_m2} (ref >59)

## 2023-09-10 LAB — LIPID PANEL W/O CHOL/HDL RATIO
Cholesterol, Total: 274 mg/dL — ABNORMAL HIGH (ref 100–199)
HDL: 79 mg/dL (ref >39)
LDL Chol Calc (NIH): 179 mg/dL — ABNORMAL HIGH (ref 0–99)
Triglycerides: 93 mg/dL (ref 0–149)
VLDL Cholesterol Cal: 16 mg/dL (ref 5–40)

## 2023-09-10 LAB — TSH: TSH: 1.68 u[IU]/mL (ref 0.450–4.500)

## 2023-09-10 LAB — VITAMIN D 25 HYDROXY (VIT D DEFICIENCY, FRACTURES): Vit D, 25-Hydroxy: 42.8 ng/mL (ref 30.0–100.0)

## 2023-09-11 NOTE — Progress Notes (Signed)
Contacted via MyChart   Good morning Breanna Davidson, your labs have returned and overall are normal with exception of lipid panel which continues to show elevation in total cholesterol and LDL, bad cholesterol.  Continue focus on diet and regular exercise + your supplements, as I know you prefer this focus.  Any questions? Keep being amazing!!  Thank you for allowing me to participate in your care.  I appreciate you. Kindest regards, Dodger Sinning

## 2023-09-14 ENCOUNTER — Telehealth: Payer: Self-pay

## 2023-09-14 NOTE — Telephone Encounter (Signed)
Patient called back to schedule her colonoscopy

## 2023-09-15 ENCOUNTER — Other Ambulatory Visit: Payer: Self-pay

## 2023-09-15 ENCOUNTER — Telehealth: Payer: Self-pay

## 2023-09-15 DIAGNOSIS — Z860100 Personal history of colon polyps, unspecified: Secondary | ICD-10-CM

## 2023-09-15 MED ORDER — NA SULFATE-K SULFATE-MG SULF 17.5-3.13-1.6 GM/177ML PO SOLN: 1.0000 | Freq: Once | ORAL | 0 refills | Status: AC

## 2023-09-15 NOTE — Telephone Encounter (Signed)
Gastroenterology Pre-Procedure Review  Request Date: 10/05/23 Requesting Physician: Dr. Servando Snare  PATIENT REVIEW QUESTIONS: The patient responded to the following health history questions as indicated:    1. Are you having any GI issues? no 2. Do you have a personal history of Polyps? yes (last colonoscopy performed by Dr. Maximino Greenland  08/26/2020) 3. Do you have a family history of Colon Cancer or Polyps? no 4. Diabetes Mellitus? no 5. Joint replacements in the past 12 months?no 6. Major health problems in the past 3 months?no 7. Any artificial heart valves, MVP, or defibrillator?no    MEDICATIONS & ALLERGIES:    Patient reports the following regarding taking any anticoagulation/antiplatelet therapy:   Plavix, Coumadin, Eliquis, Xarelto, Lovenox, Pradaxa, Brilinta, or Effient? no Aspirin? no  Patient confirms/reports the following medications:  Current Outpatient Medications  Medication Sig Dispense Refill   albuterol (VENTOLIN HFA) 108 (90 Base) MCG/ACT inhaler Inhale 1-2 puffs into the lungs every 6 (six) hours as needed for wheezing or shortness of breath. 1 g 0   CALCIUM-MAGNESIUM PO Take 1 tablet by mouth daily.     Cholecalciferol (VITAMIN D) 50 MCG (2000 UT) CAPS Take 2,000 Units by mouth daily.     Coenzyme Q10-Red Yeast Rice (CO Q-10 PLUS RED YEAST RICE PO) Take 1 capsule by mouth daily.     dorzolamide-timolol (COSOPT) 2-0.5 % ophthalmic solution Place 1 drop into both eyes 2 (two) times daily.     Omega-3 Fatty Acids (FISH OIL) 1000 MG CAPS Take 1,000 mg by mouth daily.     No current facility-administered medications for this visit.    Patient confirms/reports the following allergies:  Allergies  Allergen Reactions   Combigan [Brimonidine Tartrate-Timolol] Other (See Comments)    Irritated eyes, redness and itching   Prednisone Other (See Comments)    Altered Mental Status    No orders of the defined types were placed in this encounter.   AUTHORIZATION  INFORMATION Primary Insurance: 1D#: Group #:  Secondary Insurance: 1D#: Group #:  SCHEDULE INFORMATION: Date: 10/05/23 Time: Location: [facility]

## 2023-09-26 DIAGNOSIS — H40053 Ocular hypertension, bilateral: Secondary | ICD-10-CM | POA: Diagnosis not present

## 2023-09-26 DIAGNOSIS — H2513 Age-related nuclear cataract, bilateral: Secondary | ICD-10-CM | POA: Diagnosis not present

## 2023-09-26 DIAGNOSIS — H5203 Hypermetropia, bilateral: Secondary | ICD-10-CM | POA: Diagnosis not present

## 2023-10-05 ENCOUNTER — Ambulatory Visit: Payer: Medicare Other | Admitting: Certified Registered"

## 2023-10-05 ENCOUNTER — Other Ambulatory Visit: Payer: Self-pay

## 2023-10-05 ENCOUNTER — Encounter: Payer: Self-pay | Admitting: Gastroenterology

## 2023-10-05 ENCOUNTER — Encounter
Admission: RE | Disposition: A | Discharge status: Home / Self Care | Payer: Self-pay | Source: Home / Self Care | Attending: Gastroenterology

## 2023-10-05 ENCOUNTER — Ambulatory Visit
Admission: RE | Admit: 2023-10-05 | Discharge: 2023-10-05 | Disposition: A | Discharge status: Home / Self Care | Payer: Medicare Other | Attending: Gastroenterology | Admitting: Gastroenterology

## 2023-10-05 DIAGNOSIS — Z09 Encounter for follow-up examination after completed treatment for conditions other than malignant neoplasm: Secondary | ICD-10-CM | POA: Insufficient documentation

## 2023-10-05 DIAGNOSIS — Z1211 Encounter for screening for malignant neoplasm of colon: Secondary | ICD-10-CM

## 2023-10-05 DIAGNOSIS — Z860100 Personal history of colon polyps, unspecified: Secondary | ICD-10-CM | POA: Diagnosis not present

## 2023-10-05 DIAGNOSIS — K640 First degree hemorrhoids: Secondary | ICD-10-CM | POA: Insufficient documentation

## 2023-10-05 DIAGNOSIS — K635 Polyp of colon: Secondary | ICD-10-CM | POA: Diagnosis not present

## 2023-10-05 DIAGNOSIS — D123 Benign neoplasm of transverse colon: Secondary | ICD-10-CM | POA: Diagnosis not present

## 2023-10-05 HISTORY — PX: POLYPECTOMY: SHX5525

## 2023-10-05 HISTORY — PX: COLONOSCOPY WITH PROPOFOL: SHX5780

## 2023-10-05 SURGERY — COLONOSCOPY WITH PROPOFOL
Anesthesia: General

## 2023-10-05 MED ORDER — PROPOFOL 500 MG/50ML IV EMUL
INTRAVENOUS | Status: DC | PRN
  Administered 2023-10-05: 150 ug/kg/min via INTRAVENOUS

## 2023-10-05 MED ORDER — STERILE WATER FOR IRRIGATION IR SOLN
Status: DC | PRN
  Administered 2023-10-05 (×2): 100 mL

## 2023-10-05 MED ORDER — LIDOCAINE HCL (CARDIAC) PF 100 MG/5ML IV SOSY
PREFILLED_SYRINGE | INTRAVENOUS | Status: DC | PRN
  Administered 2023-10-05: 50 mg via INTRAVENOUS

## 2023-10-05 MED ORDER — PROPOFOL 10 MG/ML IV BOLUS
INTRAVENOUS | Status: DC | PRN
  Administered 2023-10-05: 80 mg via INTRAVENOUS

## 2023-10-05 MED ORDER — SODIUM CHLORIDE 0.9 % IV SOLN: INTRAVENOUS | Status: DC

## 2023-10-05 NOTE — Transfer of Care (Signed)
Immediate Anesthesia Transfer of Care Note  Patient: Breanna Davidson  Procedure(s) Performed: COLONOSCOPY WITH PROPOFOL POLYPECTOMY  Patient Location: PACU and Endoscopy Unit  Anesthesia Type:General  Level of Consciousness: drowsy  Airway & Oxygen Therapy: Patient Spontanous Breathing  Post-op Assessment: Report given to RN and Post -op Vital signs reviewed and stable  Post vital signs: Reviewed and stable  Last Vitals:  Vitals Value Taken Time  BP    Temp    Pulse    Resp    SpO2      Last Pain:  Vitals:   10/05/23 0819  TempSrc: Temporal  PainSc: 0-No pain         Complications: No notable events documented.

## 2023-10-05 NOTE — H&P (Signed)
Midge Minium, MD [facility] [address], [address] [place], [place] [address] Phone:[phone] Fax : [phone]  Primary Care Physician:  Marjie Skiff, NP Primary Gastroenterologist:  Dr. Servando Snare  Pre-Procedure History & Physical: HPI:  Breanna Davidson is a 72 y.o. female is here for an colonoscopy.   Past Medical History:  Diagnosis Date   Allergy    Chest pain    2017 ED VISIT FOR CHEST PAIN , R/O AS GASTRIC CAUSE    Depression    per NP notes, on cymbalta   Headache    per patient very rare sometimes when not well rested   Hyperlipidemia    Increased pressure in the eye    "THEY DID SURGERY ON MY EYES TO REDUCE THE PRESSURE , THEY MADE TWO HOLES IN MY EYES "   Osteopenia    Spinal stenosis    LUMBAR    Vertigo    IMPROVED BUT HAS OCCASIONAL SX    Past Surgical History:  Procedure Laterality Date   APPENDECTOMY     BACK SURGERY  11/2019   CESAREAN SECTION     COLONOSCOPY WITH PROPOFOL N/A 08/26/2020   Procedure: COLONOSCOPY WITH PROPOFOL;  Surgeon: Pasty Spillers, MD;  Location: [facility] ENDOSCOPY;  Service: Endoscopy;  Laterality: N/A;   DILATION AND CURETTAGE OF UTERUS     EYE SURGERY     "THEY DID SURGERY ON MY EYES TO REDUCE THE PRESSURE , THEY MADE TWO HOLES IN MY EYES "   JOINT REPLACEMENT     REDUCTION MAMMAPLASTY  1988   SPINE SURGERY     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 Authorizing Provider  dorzolamide-timolol (COSOPT) 2-0.5 % ophthalmic solution Place 1 drop into both eyes 2 (two) times daily. 07/05/23  Yes [provider]  albuterol (VENTOLIN HFA) 108 (90 Base) MCG/ACT inhaler Inhale 1-2 puffs into the lungs every 6 (six) hours as needed for wheezing or shortness of breath. 11/22/22   Shirlee Latch, PA-C   CALCIUM-MAGNESIUM PO Take 1 tablet by mouth daily.    [provider]  Cholecalciferol (VITAMIN D) 50 MCG (2000 UT) CAPS Take 2,000 Units by mouth daily.    [provider]  Coenzyme Q10-Red Yeast Rice (CO Q-10 PLUS RED YEAST RICE PO) Take 1 capsule by mouth daily.    [provider]  Omega-3 Fatty Acids (FISH OIL) 1000 MG CAPS Take 1,000 mg by mouth daily.    [provider]    Allergies as of 09/15/2023 - Review Complete 09/15/2023  Allergen Reaction Noted   Combigan [brimonidine tartrate-timolol] Other (See Comments) 07/30/2020   Prednisone Other (See Comments) 05/13/2015    Family History  Problem Relation Age of Onset   Breast cancer Mother 52       and again at 59   Lung disease Mother    Cancer Mother    Emphysema Mother    CAD Father    Hyperlipidemia Sister    Mental illness Daughter        bipolar   Cystic fibrosis Daughter    Breast cancer Maternal Aunt    Breast cancer Maternal Grandmother     Social History   Socioeconomic History   Marital status: Married    Spouse name: Not  on file   Number of children: Not on file   Years of education: college    Highest education level: Associate degree: occupational, Scientist, product/process development, or vocational program  Occupational History   Occupation: retired   Tobacco Use   Smoking status: Never   Smokeless tobacco: Never  Vaping Use   Vaping status: Never Used  Substance and Sexual Activity   Alcohol use: Not Currently    Alcohol/week: 1.0 standard drink of alcohol    Types: 1 Shots of liquor per week   Drug use: No   Sexual activity: Not Currently  Other Topics Concern   Not on file  Social History Narrative   Not on file   Social Determinants of Health   Financial Resource Strain: Low Risk  (03/07/2023)   Overall Financial Resource Strain (CARDIA)    Difficulty of Paying Living Expenses: Not hard at all  Food Insecurity: No Food Insecurity (03/07/2023)   Hunger Vital Sign    Worried  About Running Out of Food in the Last Year: Never true    Ran Out of Food in the Last Year: Never true  Transportation Needs: No Transportation Needs (03/07/2023)   PRAPARE - Administrator, Civil Service (Medical): No    Lack of Transportation (Non-Medical): No  Physical Activity: Sufficiently Active (03/07/2023)   Exercise Vital Sign    Days of Exercise per Week: 5 days    Minutes of Exercise per Session: 30 min  Stress: No Stress Concern Present (03/07/2023)   [employer] of Occupational Health - Occupational Stress Questionnaire    Feeling of Stress : Not at all  Social Connections: Moderately Integrated (03/07/2023)   Social Connection and Isolation Panel [NHANES]    Frequency of Communication with Friends and Family: Three times a week    Frequency of Social Gatherings with Friends and Family: Twice a week    Attends Religious Services: 1 to 4 times per year    Active Member of [employer] or Organizations: No    Attends Engineer, structural: Not on file    Marital Status: Married  Catering manager Violence: Not At Risk (10/04/2022)   Humiliation, Afraid, Rape, and Kick questionnaire    Fear of Current or Ex-Partner: No    Emotionally Abused: No    Physically Abused: No    Sexually Abused: No    Review of Systems: See HPI, otherwise negative ROS  Physical Exam: BP (!) 140/68   Pulse 63   Temp 97.8 F (36.6 C) (Temporal)   Resp 16   Ht 5\' 2"  (1.575 m)   Wt 72.8 kg   LMP  (LMP Unknown)   SpO2 100%   BMI 29.37 kg/m  General:   Alert,  pleasant and cooperative in NAD Head:  Normocephalic and atraumatic. Neck:  Supple; no masses or thyromegaly. Lungs:  Clear throughout to auscultation.    Heart:  Regular rate and rhythm. Abdomen:  Soft, nontender and nondistended. Normal bowel sounds, without guarding, and without rebound.   Neurologic:  Alert and  oriented x4;  grossly normal neurologically.  Impression/Plan: Breanna Davidson is here for an  colonoscopy to be performed for a history of adenomatous polyps on 2021   Risks, benefits, limitations, and alternatives regarding  colonoscopy have been reviewed with the patient.  Questions have been answered.  All parties agreeable.   Midge Minium, MD  10/05/2023, 8:38 AM

## 2023-10-05 NOTE — Op Note (Signed)
[facility] Gastroenterology Patient Name: Breanna Davidson Procedure Date: 10/05/2023 8:33 AM MRN: [id number] Account #: [id number] Date of Birth: Feb 05, 1951 Admit Type: Outpatient Age: 72 Room: [facility] ENDO ROOM 4 Gender: Female Note Status: Finalized Instrument Name: Prentice Docker [id number] Procedure:             Colonoscopy Indications:           High risk colon cancer surveillance: Personal history                         of colonic polyps Providers:             Midge Minium MD, MD Referring MD:          Midge Minium MD, MD (Referring MD), Dorie Rank. Cannady                         (Referring MD) Medicines:             Propofol per Anesthesia Complications:         No immediate complications. Procedure:             Pre-Anesthesia Assessment:                        - Prior to the procedure, a History and Physical was                         performed, and patient medications and allergies were                         reviewed. The patient's tolerance of previous                         anesthesia was also reviewed. The risks and benefits                         of the procedure and the sedation options and risks                         were discussed with the patient. All questions were                         answered, and informed consent was obtained. Prior                         Anticoagulants: The patient has taken no anticoagulant                         or antiplatelet agents. ASA Grade Assessment: II - A                         patient with mild systemic disease. After reviewing                         the risks and benefits, the patient was deemed in                         satisfactory condition to undergo the procedure.  After obtaining informed consent, the colonoscope was                         passed under direct vision. Throughout the procedure,                         the patient's blood pressure, pulse, and oxygen                          saturations were monitored continuously. The                         Colonoscope was introduced through the anus and                         advanced to the the cecum, identified by appendiceal                         orifice and ileocecal valve. The colonoscopy was                         performed without difficulty. The patient tolerated                         the procedure well. The quality of the bowel                         preparation was excellent. Findings:      The perianal and digital rectal examinations were normal.      A 3 mm polyp was found in the transverse colon. The polyp was sessile.       The polyp was removed with a cold snare. Resection and retrieval were       complete.      Non-bleeding internal hemorrhoids were found during retroflexion. The       hemorrhoids were Grade I (internal hemorrhoids that do not prolapse). Impression:            - One 3 mm polyp in the transverse colon, removed with                         a cold snare. Resected and retrieved.                        - Non-bleeding internal hemorrhoids. Recommendation:        - Discharge patient to home.                        - Resume previous diet.                        - Await pathology results.                        - Repeat colonoscopy in 7 years for surveillance. Procedure Code(s):     --- Professional ---                        [phone], Colonoscopy, flexible; with removal of  tumor(s), polyp(s), or other lesion(s) by snare                         technique Diagnosis Code(s):     --- Professional ---                        Z86.010, Personal history of colonic polyps                        D12.3, Benign neoplasm of transverse colon (hepatic                         flexure or splenic flexure) CPT copyright 2022 American Medical Association. All rights reserved. The codes documented in this report are preliminary and upon coder review may  be revised to meet current  compliance requirements. Midge Minium MD, MD 10/05/2023 9:09:49 AM This report has been signed electronically. Number of Addenda: 0 Note Initiated On: 10/05/2023 8:33 AM Scope Withdrawal Time: 0 hours 19 minutes 31 seconds  Total Procedure Duration: 0 hours 22 minutes 50 seconds  Estimated Blood Loss:  Estimated blood loss: none.      [facility]

## 2023-10-05 NOTE — Anesthesia Preprocedure Evaluation (Signed)
Anesthesia Evaluation  Patient identified by MRN, date of birth, ID band Patient awake    Reviewed: Allergy & Precautions, H&P , NPO status , Patient's Chart, lab work & pertinent test results, reviewed documented beta blocker date and time   Airway Mallampati: II   Neck ROM: full    Dental  (+) Poor Dentition   Pulmonary neg pulmonary ROS   Pulmonary exam normal        Cardiovascular Exercise Tolerance: Good negative cardio ROS Normal cardiovascular exam Rhythm:regular Rate:Normal     Neuro/Psych  Headaches PSYCHIATRIC DISORDERS  Depression     Neuromuscular disease    GI/Hepatic negative GI ROS, Neg liver ROS,,,  Endo/Other  negative endocrine ROS    Renal/GU negative Renal ROS  negative genitourinary   Musculoskeletal   Abdominal   Peds  Hematology negative hematology ROS (+)   Anesthesia Other Findings Past Medical History: No date: Allergy No date: Chest pain     Comment:  2017 ED VISIT FOR CHEST PAIN , R/O AS GASTRIC CAUSE  No date: Depression     Comment:  per NP notes, on cymbalta No date: Headache     Comment:  per patient very rare sometimes when not well rested No date: Hyperlipidemia No date: Increased pressure in the eye     Comment:  "THEY DID SURGERY ON MY EYES TO REDUCE THE PRESSURE ,               THEY MADE TWO HOLES IN MY EYES " No date: Osteopenia No date: Spinal stenosis     Comment:  LUMBAR  No date: Vertigo     Comment:  IMPROVED BUT HAS OCCASIONAL SX Past Surgical History: No date: APPENDECTOMY 11/2019: BACK SURGERY No date: CESAREAN SECTION 08/26/2020: COLONOSCOPY WITH PROPOFOL; N/A     Comment:  Procedure: COLONOSCOPY WITH PROPOFOL;  Surgeon:               Pasty Spillers, MD;  Location: [facility] ENDOSCOPY;                Service: Endoscopy;  Laterality: N/A; No date: DILATION AND CURETTAGE OF UTERUS No date: EYE SURGERY     Comment:  "THEY DID SURGERY ON MY EYES TO REDUCE  THE PRESSURE ,               THEY MADE TWO HOLES IN MY EYES " No date: JOINT REPLACEMENT 1988: REDUCTION MAMMAPLASTY No date: SPINE SURGERY 06/19/2019: TOTAL HIP ARTHROPLASTY; Left     Comment:  Procedure: TOTAL HIP ARTHROPLASTY ANTERIOR APPROACH;                Surgeon: Sheral Apley, MD;  Location: [facility];                Service: Orthopedics;  Laterality: Left; BMI    Body Mass Index: 29.37 kg/m     Reproductive/Obstetrics negative OB ROS                             Anesthesia Physical Anesthesia Plan  ASA: 2  Anesthesia Plan: General   Post-op Pain Management:    Induction:   PONV Risk Score and Plan:   Airway Management Planned:   Additional Equipment:   Intra-op Plan:   Post-operative Plan:   Informed Consent: I have reviewed the patients History and Physical, chart, labs and discussed the procedure including the risks, benefits and alternatives for the  proposed anesthesia with the patient or authorized representative who has indicated his/her understanding and acceptance.     Dental Advisory Given  Plan Discussed with: CRNA  Anesthesia Plan Comments:        Anesthesia Quick Evaluation

## 2023-10-05 NOTE — Anesthesia Postprocedure Evaluation (Signed)
Anesthesia Post Note  Patient: ESCARLETH GOWELL  Procedure(s) Performed: COLONOSCOPY WITH PROPOFOL POLYPECTOMY  Patient location during evaluation: PACU Anesthesia Type: General Level of consciousness: awake and alert Pain management: pain level controlled Vital Signs Assessment: post-procedure vital signs reviewed and stable Respiratory status: spontaneous breathing, nonlabored ventilation, respiratory function stable and patient connected to nasal cannula oxygen Cardiovascular status: blood pressure returned to baseline and stable Postop Assessment: no apparent nausea or vomiting Anesthetic complications: no   No notable events documented.   Last Vitals:  Vitals:   10/05/23 0919 10/05/23 0935  BP: 125/75 (!) 141/65  Pulse: 63 (!) 50  Resp: 16 12  Temp:    SpO2: 100%     Last Pain:  Vitals:   10/05/23 0919  TempSrc:   PainSc: 0-No pain                 Yevette Edwards

## 2023-10-05 NOTE — Anesthesia Procedure Notes (Signed)
Procedure Name: MAC Date/Time: 10/05/2023 8:40 AM  Performed by: Cheral Bay, CRNAPre-anesthesia Checklist: Patient identified, Emergency Drugs available, Suction available, Patient being monitored and Timeout performed Patient Re-evaluated:Patient Re-evaluated prior to induction Oxygen Delivery Method: Nasal cannula Induction Type: IV induction Placement Confirmation: positive ETCO2 and CO2 detector

## 2023-10-06 ENCOUNTER — Encounter: Payer: Self-pay | Admitting: Gastroenterology

## 2023-10-06 LAB — SURGICAL PATHOLOGY

## 2023-10-12 DIAGNOSIS — L538 Other specified erythematous conditions: Secondary | ICD-10-CM | POA: Diagnosis not present

## 2023-10-12 DIAGNOSIS — D225 Melanocytic nevi of trunk: Secondary | ICD-10-CM | POA: Diagnosis not present

## 2023-10-12 DIAGNOSIS — L570 Actinic keratosis: Secondary | ICD-10-CM | POA: Diagnosis not present

## 2023-10-12 DIAGNOSIS — L821 Other seborrheic keratosis: Secondary | ICD-10-CM | POA: Diagnosis not present

## 2023-10-12 DIAGNOSIS — D2262 Melanocytic nevi of left upper limb, including shoulder: Secondary | ICD-10-CM | POA: Diagnosis not present

## 2023-10-12 DIAGNOSIS — D2271 Melanocytic nevi of right lower limb, including hip: Secondary | ICD-10-CM | POA: Diagnosis not present

## 2023-10-12 DIAGNOSIS — L820 Inflamed seborrheic keratosis: Secondary | ICD-10-CM | POA: Diagnosis not present

## 2023-10-12 DIAGNOSIS — D2272 Melanocytic nevi of left lower limb, including hip: Secondary | ICD-10-CM | POA: Diagnosis not present

## 2023-10-12 DIAGNOSIS — D2261 Melanocytic nevi of right upper limb, including shoulder: Secondary | ICD-10-CM | POA: Diagnosis not present

## 2023-10-24 ENCOUNTER — Ambulatory Visit
Admission: RE | Admit: 2023-10-24 | Discharge: 2023-10-24 | Disposition: A | Discharge status: Home / Self Care | Payer: Medicare Other | Source: Ambulatory Visit | Attending: Nurse Practitioner | Admitting: Nurse Practitioner

## 2023-10-24 DIAGNOSIS — Z1231 Encounter for screening mammogram for malignant neoplasm of breast: Secondary | ICD-10-CM | POA: Insufficient documentation

## 2023-10-27 ENCOUNTER — Ambulatory Visit (INDEPENDENT_AMBULATORY_CARE_PROVIDER_SITE_OTHER): Payer: Medicare Other | Admitting: Emergency Medicine

## 2023-10-27 VITALS — Ht 62.0 in | Wt 160.0 lb

## 2023-10-27 DIAGNOSIS — Z0000 Encounter for general adult medical examination without abnormal findings: Secondary | ICD-10-CM | POA: Diagnosis not present

## 2023-10-27 NOTE — Progress Notes (Signed)
 Subjective:   Breanna Davidson is a 73 y.o. female who presents for Medicare Annual (Subsequent) preventive examination.  Interactive audio and video telecommunications were attempted between this provider and patient, however failed, due to patient having technical difficulties OR patient did not have access to video capability.  We continued and completed visit with audio only.  Visit Complete: Virtual I connected with  Breanna Davidson on 10/27/23 by a audio enabled telemedicine application and verified that I am speaking with the correct person using two identifiers.  Patient Location: Home  Provider Location: Home Office  I discussed the limitations of evaluation and management by telemedicine. The patient expressed understanding and agreed to proceed.  Vital Signs: Because this visit was a virtual/telehealth visit, some criteria may be missing or patient reported. Any vitals not documented were not able to be obtained and vitals that have been documented are patient reported.  Patient Medicare AWV questionnaire was completed by the patient on 10/23/23; I have confirmed that all information answered by patient is correct and no changes since this date.  Cardiac Risk Factors include: advanced age (>73men, >76 women);dyslipidemia;Other (see comment), Risk factor comments: Aortic atherosclerosis     Objective:    Today's Vitals   10/27/23 1351  Weight: 160 lb (72.6 kg)  Height: 5' 2 (1.575 m)   Body mass index is 29.26 kg/m.     10/27/2023    2:04 PM 10/05/2023    8:22 AM 10/04/2022    8:35 AM 09/09/2021   12:59 PM 09/08/2020    2:32 PM 08/26/2020    8:12 AM 12/13/2019    8:00 AM  Advanced Directives  Does Patient Have a Medical Advance Directive? Yes Yes Yes Yes Yes Yes Yes  Type of Estate Agent of [place];Living will Healthcare Power of [place];Living will Living will Healthcare Power of [employer] Power of [place];Living will Living  will;Healthcare Power of Attorney Living will  Does patient want to make changes to medical advance directive? No - Patient declined      No - Patient declined  Copy of Healthcare Power of Attorney in Chart? Yes - validated most recent copy scanned in chart (See row information)   Yes - validated most recent copy scanned in chart (See row information) Yes - validated most recent copy scanned in chart (See row information) No - copy requested     Current Medications (verified) Outpatient Encounter Medications as of 10/27/2023  Medication Sig   albuterol  (VENTOLIN  HFA) 108 (90 Base) MCG/ACT inhaler Inhale 1-2 puffs into the lungs every 6 (six) hours as needed for wheezing or shortness of breath.   CALCIUM -MAGNESIUM  PO Take 1 tablet by mouth daily.   Cholecalciferol  (VITAMIN D ) 50 MCG (2000 UT) CAPS Take 2,000 Units by mouth daily.   dorzolamide-timolol  (COSOPT) 2-0.5 % ophthalmic solution Place 1 drop into both eyes 2 (two) times daily.   Multiple Vitamins-Minerals (ZINC PO) Take 1 tablet by mouth daily.   Omega-3 Fatty Acids (FISH OIL) 1000 MG CAPS Take 1,000 mg by mouth daily.   polyethylene glycol (MIRALAX  / GLYCOLAX ) 17 g packet Take 17 g by mouth daily as needed.   Coenzyme Q10-Red Yeast Rice (CO Q-10 PLUS RED YEAST RICE PO) Take 1 capsule by mouth daily. (Patient not taking: Reported on 10/27/2023)   No facility-administered encounter medications on file as of 10/27/2023.    Allergies (verified) Combigan  [brimonidine  tartrate-timolol ] and Prednisone   History: Past Medical History:  Diagnosis Date   Allergy  Chest pain    2017 ED VISIT FOR CHEST PAIN , R/O AS GASTRIC CAUSE    Depression    per NP notes, on cymbalta    Headache    per patient very rare sometimes when not well rested   Hyperlipidemia    Increased pressure in the eye    THEY DID SURGERY ON MY EYES TO REDUCE THE PRESSURE , THEY MADE TWO HOLES IN MY EYES    Osteopenia    Spinal stenosis    LUMBAR    Vertigo     IMPROVED BUT HAS OCCASIONAL SX   Past Surgical History:  Procedure Laterality Date   APPENDECTOMY     BACK SURGERY  11/2019   CESAREAN SECTION     COLONOSCOPY WITH PROPOFOL  N/A 08/26/2020   Procedure: COLONOSCOPY WITH PROPOFOL ;  Surgeon: Breanna Keene NOVAK, MD;  Location: [facility] ENDOSCOPY;  Service: Endoscopy;  Laterality: N/A;   COLONOSCOPY WITH PROPOFOL  N/A 10/05/2023   Procedure: COLONOSCOPY WITH PROPOFOL ;  Surgeon: Breanna Carmine, MD;  Location: [facility] ENDOSCOPY;  Service: Endoscopy;  Laterality: N/A;   DILATION AND CURETTAGE OF UTERUS     EYE SURGERY     THEY DID SURGERY ON MY EYES TO REDUCE THE PRESSURE , THEY MADE TWO HOLES IN MY EYES    JOINT REPLACEMENT     POLYPECTOMY  10/05/2023   Procedure: POLYPECTOMY;  Surgeon: Breanna Carmine, MD;  Location: [facility] ENDOSCOPY;  Service: Endoscopy;;   REDUCTION MAMMAPLASTY  1988   SPINE SURGERY     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   Procedure: TOTAL HIP ARTHROPLASTY ANTERIOR APPROACH;  Surgeon: Beverley Evalene BIRCH, MD;  Location: [facility] ORS;  Service: Orthopedics;  Laterality: Left;   Family History  Problem Relation Age of Onset   Breast cancer Mother 77       and again at 77   Lung disease Mother    Cancer Mother    Emphysema Mother    CAD Father    Hyperlipidemia Sister    Mental illness Daughter        bipolar   Cystic fibrosis Daughter    Breast cancer Maternal Aunt    Breast cancer Maternal Grandmother    Social History   Socioeconomic History   Marital status: Married    Spouse name: Elsie   Number of children: 1   Years of education: college    Highest education level: Associate degree: occupational, scientist, product/process development, or vocational program  Occupational History   Occupation: retired   Tobacco Use   Smoking status: Never   Smokeless tobacco: Never  Vaping Use   Vaping status: Never Used  Substance and Sexual Activity   Alcohol use: Yes    Alcohol/week: 1.0 standard drink of alcohol    Types: 1 Shots of liquor per week     Comment: 1 glass of wine daily with dinner   Drug use: No   Sexual activity: Not Currently  Other Topics Concern   Not on file  Social History Narrative   Not on file   Social Drivers of Health   Financial Resource Strain: Low Risk  (10/27/2023)   Overall Financial Resource Strain (CARDIA)    Difficulty of Paying Living Expenses: Not hard at all  Food Insecurity: No Food Insecurity (10/27/2023)   Hunger Vital Sign    Worried About Running Out of Food in the Last Year: Never true    Ran Out of Food in the Last Year: Never true  Transportation Needs: No Transportation Needs (10/27/2023)  PRAPARE - Administrator, Civil Service (Medical): No    Lack of Transportation (Non-Medical): No  Physical Activity: Sufficiently Active (10/27/2023)   Exercise Vital Sign    Days of Exercise per Week: 7 days    Minutes of Exercise per Session: 30 min  Stress: No Stress Concern Present (10/27/2023)   [employer] of Occupational Health - Occupational Stress Questionnaire    Feeling of Stress : Not at all  Social Connections: Socially Integrated (10/27/2023)   Social Connection and Isolation Panel [NHANES]    Frequency of Communication with Friends and Family: More than three times a week    Frequency of Social Gatherings with Friends and Family: More than three times a week    Attends Religious Services: More than 4 times per year    Active Member of [employer] or Organizations: Yes    Attends Banker Meetings: 1 to 4 times per year    Marital Status: Married    Tobacco Counseling Counseling given: Not Answered   Clinical Intake:  Pre-visit preparation completed: Yes  Pain : No/denies pain     BMI - recorded: 29.26 Nutritional Status: BMI 25 -29 Overweight Nutritional Risks: None Diabetes: No  How often do you need to have someone help you when you read instructions, pamphlets, or other written materials from your doctor or pharmacy?: 1 - Never  Interpreter  Needed?: No  Information entered by :: Vina Ned, CMA   Activities of Daily Living    10/27/2023    1:53 PM 10/23/2023    1:14 PM  In your present state of health, do you have any difficulty performing the following activities:  Hearing? 0 0  Vision? 0 0  Difficulty concentrating or making decisions? 0 0  Walking or climbing stairs? 0 0  Dressing or bathing? 0 0  Doing errands, shopping? 0 0  Preparing Food and eating ? N N  Using the Toilet? N N  In the past six months, have you accidently leaked urine? N N  Do you have problems with loss of bowel control? N N  Managing your Medications? N N  Managing your Finances? N N  Housekeeping or managing your Housekeeping? N N    Patient Care Team: Cannady, Jolene T, NP as PCP - General (Nurse Practitioner)  Indicate any recent Medical Services you may have received from other than [facility] providers in the past year (date may be approximate).     Assessment:   This is a routine wellness examination for Burnie.  Hearing/Vision screen Hearing Screening - Comments:: No hearing loss Vision Screening - Comments:: Gets eye exams   Goals Addressed             This Visit's Progress    Patient Stated       Maintain health and lose a little weight      Depression Screen    10/27/2023    2:02 PM 09/09/2023    8:53 AM 03/08/2023    8:23 AM 10/04/2022    8:41 AM 09/07/2022    1:10 PM 01/19/2022    9:32 AM 01/19/2022    9:22 AM  PHQ 2/9 Scores  PHQ - 2 Score 1 0 0 0 0 0   PHQ- 9 Score 1 1 1 2  0 0   Exception Documentation       Patient refusal    Fall Risk    10/27/2023    2:05 PM 10/23/2023    1:14 PM 09/09/2023  8:53 AM 03/08/2023    8:22 AM 10/04/2022    8:35 AM  Fall Risk   Falls in the past year? 0 0 0 0 0  Number falls in past yr: 0  0 0 0  Injury with Fall? 0  0 0 0  Risk for fall due to : No Fall Risks  No Fall Risks    Follow up Falls prevention discussed  Falls evaluation completed Falls evaluation  completed Falls evaluation completed;Education provided;Falls prevention discussed    MEDICARE RISK AT HOME: Medicare Risk at Home Any stairs in or around the home?: (Patient-Rptd) Yes If so, are there any without handrails?: No Home free of loose throw rugs in walkways, pet beds, electrical cords, etc?: (Patient-Rptd) No Adequate lighting in your home to reduce risk of falls?: (Patient-Rptd) Yes Life alert?: (Patient-Rptd) No Use of a cane, walker or w/c?: (Patient-Rptd) No Grab bars in the bathroom?: (Patient-Rptd) No Shower chair or bench in shower?: (Patient-Rptd) Yes Elevated toilet seat or a handicapped toilet?: (Patient-Rptd) Yes  TIMED UP AND GO:  Was the test performed?  No    Cognitive Function:        10/27/2023    2:05 PM 10/04/2022    8:39 AM 09/08/2020    2:38 PM 08/20/2019    9:31 AM 06/23/2018    8:17 AM  6CIT Screen  What Year? 0 points 0 points 0 points 0 points 0 points  What month? 0 points 0 points 0 points 0 points 0 points  What time? 0 points 0 points 0 points 0 points 0 points  Count back from 20 0 points 0 points 0 points 0 points 0 points  Months in reverse 2 points 0 points 0 points 0 points 0 points  Repeat phrase 2 points 0 points 2 points 0 points 0 points  Total Score 4 points 0 points 2 points 0 points 0 points    Immunizations Immunization History  Administered Date(s) Administered   Fluad Quad(high Dose 65+) 07/31/2019, 08/03/2021   Fluad Trivalent(High Dose 65+) 09/09/2023   Hepatitis A, Adult 04/13/2021   Hepatitis B, ADULT 04/13/2021, 05/13/2021   Influenza, High Dose Seasonal PF 07/23/2016, 07/26/2017, 08/01/2018, 08/02/2022   Influenza-Unspecified 07/13/2020   PFIZER(Purple Top)SARS-COV-2 Vaccination 11/14/2019, 12/02/2019, 08/13/2020, 06/08/2021, 09/03/2021   Pfizer(Comirnaty)Fall Seasonal Vaccine 12 years and older 08/02/2022   Pneumococcal Conjugate-13 07/23/2016   Pneumococcal Polysaccharide-23 07/26/2017   Td 11/13/2004,  04/13/2021   Zoster, Live 09/12/2015    TDAP status: Up to date  Flu Vaccine status: Up to date  Pneumococcal vaccine status: Up to date  Covid-19 vaccine status: Information provided on how to obtain vaccines.   Qualifies for Shingles Vaccine? Yes   Zostavax completed Yes   Shingrix Completed?: No.    Education has been provided regarding the importance of this vaccine. Patient has been advised to call insurance company to determine out of pocket expense if they have not yet received this vaccine. Advised may also receive vaccine at local pharmacy or Health Dept. Verbalized acceptance and understanding.  Screening Tests Health Maintenance  Topic Date Due   COVID-19 Vaccine (7 - 2024-25 season) 06/26/2023   Zoster Vaccines- Shingrix (1 of 2) 12/05/2023 (Originally 03/11/2001)   MAMMOGRAM  10/20/2024   Medicare Annual Wellness (AWV)  10/26/2024   DEXA SCAN  10/15/2026   Colonoscopy  10/04/2030   DTaP/Tdap/Td (3 - Tdap) 04/14/2031   Pneumonia Vaccine 50+ Years old  Completed   INFLUENZA VACCINE  Completed   Hepatitis  C Screening  Completed   HPV VACCINES  Aged Out    Health Maintenance  Health Maintenance Due  Topic Date Due   COVID-19 Vaccine (7 - 2024-25 season) 06/26/2023    Colorectal cancer screening: Type of screening: Colonoscopy. Completed 10/05/23. Repeat every 7 years  Mammogram status: Completed 10/24/23. Repeat every year  Bone Density status: Completed 10/15/21. Results reflect: Bone density results: OSTEOPENIA. Repeat every 5 years.  Lung Cancer Screening: (Low Dose CT Chest recommended if Age 64-80 years, 20 pack-year currently smoking OR have quit w/in 15years.) does not qualify.   Lung Cancer Screening Referral: n/a  Additional Screening:  Hepatitis C Screening: does not qualify; Completed 03/09/16  Vision Screening: Recommended annual ophthalmology exams for early detection of glaucoma and other disorders of the eye.  Dental Screening: Recommended  annual dental exams for proper oral hygiene  Community Resource Referral / Chronic Care Management: CRR required this visit?  No   CCM required this visit?  No     Plan:     I have personally reviewed and noted the following in the patient's chart:   Medical and social history Use of alcohol, tobacco or illicit drugs  Current medications and supplements including opioid prescriptions. Patient is not currently taking opioid prescriptions. Functional ability and status Nutritional status Physical activity Advanced directives List of other physicians Hospitalizations, surgeries, and ER visits in previous 12 months Vitals Screenings to include cognitive, depression, and falls Referrals and appointments  In addition, I have reviewed and discussed with patient certain preventive protocols, quality metrics, and best practice recommendations. A written personalized care plan for preventive services as well as general preventive health recommendations were provided to patient.     Vina Ned, CMA   10/27/2023   After Visit Summary: (MyChart) Due to this being a telephonic visit, the after visit summary with patients personalized plan was offered to patient via MyChart   Nurse Notes:  6 CIT Score - 4 Patient is undecided about getting the covid and shingles vaccines.

## 2023-10-27 NOTE — Patient Instructions (Addendum)
 [name] , Thank you for taking time to come for your Medicare Wellness Visit. I appreciate your ongoing commitment to your health goals. Please review the following plan we discussed and let me know if I can assist you in the future.   Referrals/Orders/Follow-Ups/Clinician Recommendations: Get the covid and shingles vaccines at your convenience.  This is a list of the screening recommended for you and due dates:  Health Maintenance  Topic Date Due   COVID-19 Vaccine (7 - 2024-25 season) 06/26/2023   Zoster (Shingles) Vaccine (1 of 2) 12/05/2023*   Mammogram  10/20/2024   Medicare Annual Wellness Visit  10/26/2024   DEXA scan (bone density measurement)  10/15/2026   Colon Cancer Screening  10/04/2030   DTaP/Tdap/Td vaccine (3 - Tdap) 04/14/2031   Pneumonia Vaccine  Completed   Flu Shot  Completed   Hepatitis C Screening  Completed   HPV Vaccine  Aged Out  *Topic was postponed. The date shown is not the original due date.    Advanced directives: (In Chart) A copy of your advanced directives are scanned into your chart should your provider ever need it.  Next Medicare Annual Wellness Visit scheduled for next year: Yes, 11/08/24 @ 11:20am

## 2023-11-13 IMAGING — MG MM DIGITAL SCREENING BILAT W/ TOMO AND CAD
8 series · 8 of 24 positions shown · non-contrast
Comparison: Previous exam(s).

CLINICAL DATA: Screening.

EXAM:
DIGITAL SCREENING BILATERAL MAMMOGRAM WITH TOMOSYNTHESIS AND CAD
TECHNIQUE: Bilateral screening digital craniocaudal and mediolateral oblique
mammograms were obtained. Bilateral screening digital breast
tomosynthesis was performed. The images were evaluated with
computer-aided detection.

[R MLO synth-2D]
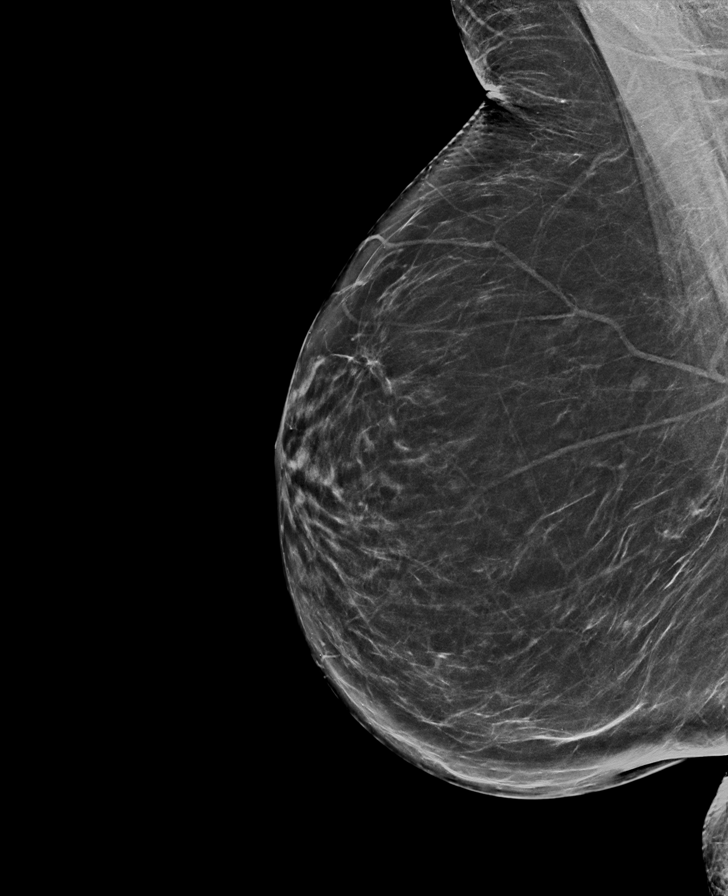

[R CC synth-2D]
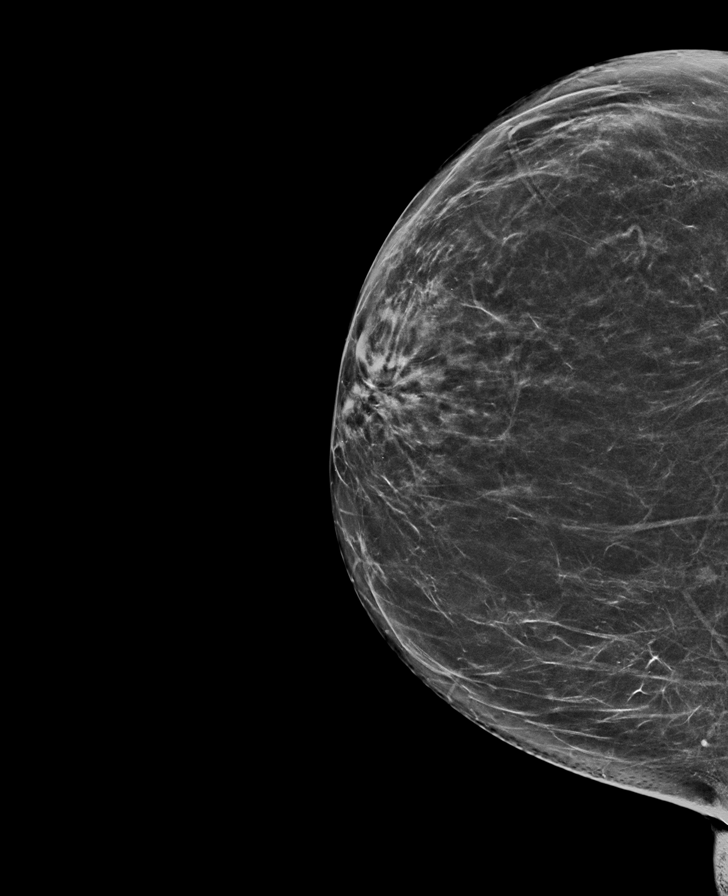

[L CC synth-2D]
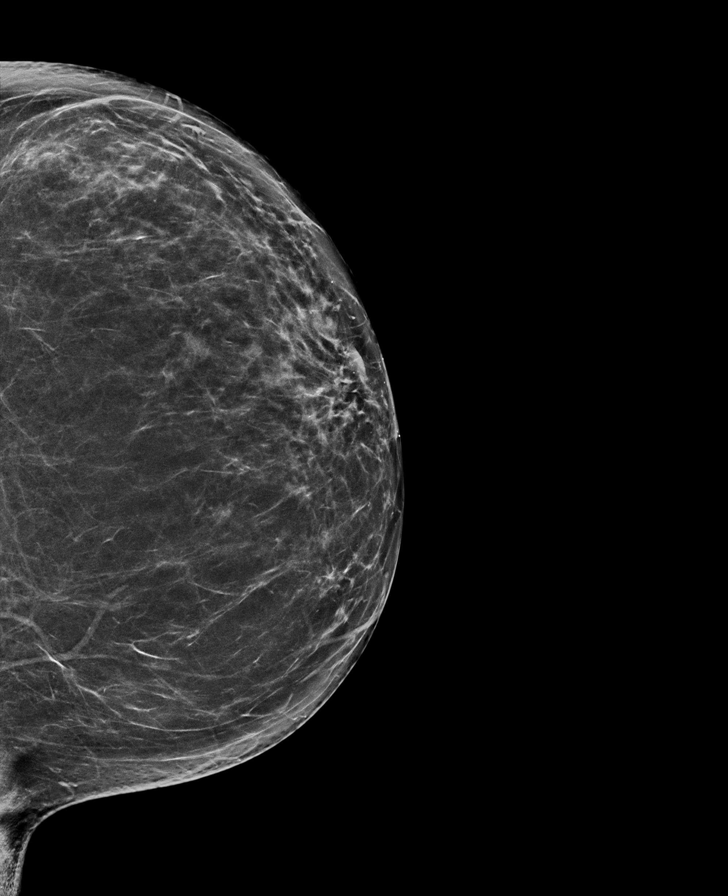

[L MLO synth-2D]
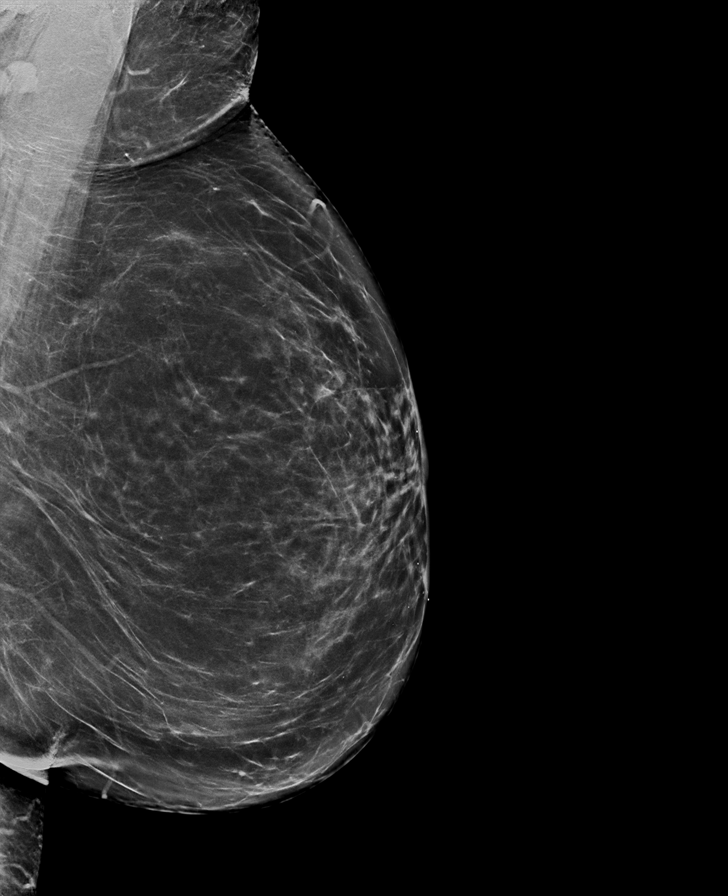

[L CC tomo · tomo slice 37/72.0]
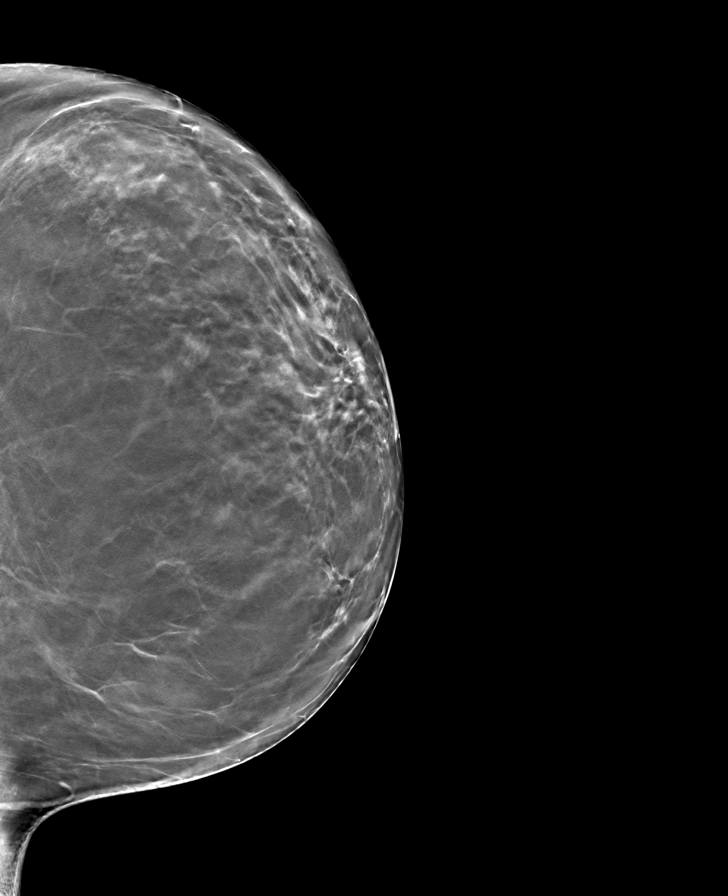

[R CC tomo · tomo slice 36/71.0]
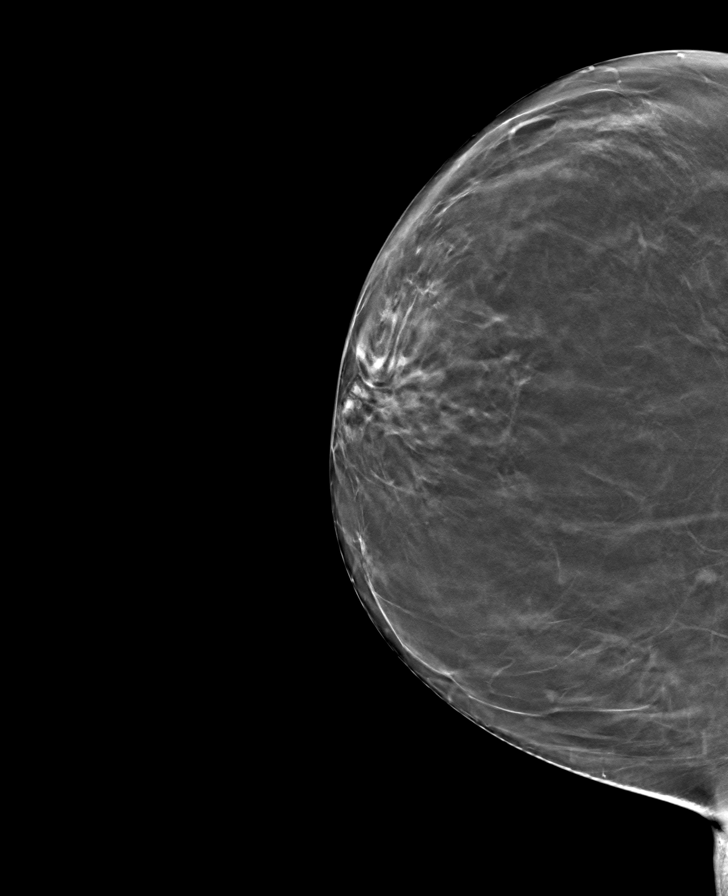

[L MLO tomo · tomo slice 41/80.0]
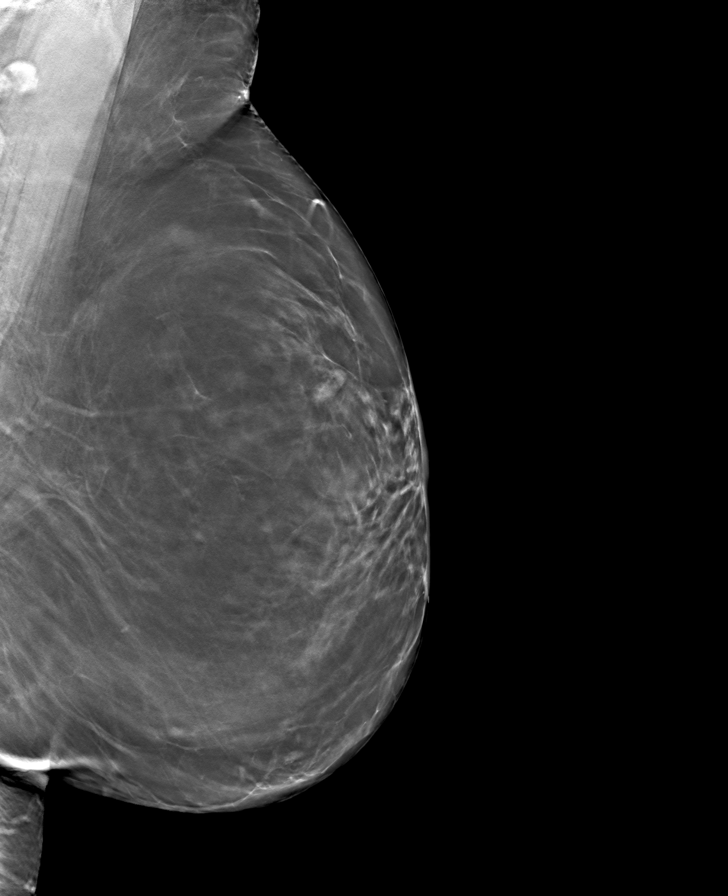

[R MLO tomo · tomo slice 38/75.0]
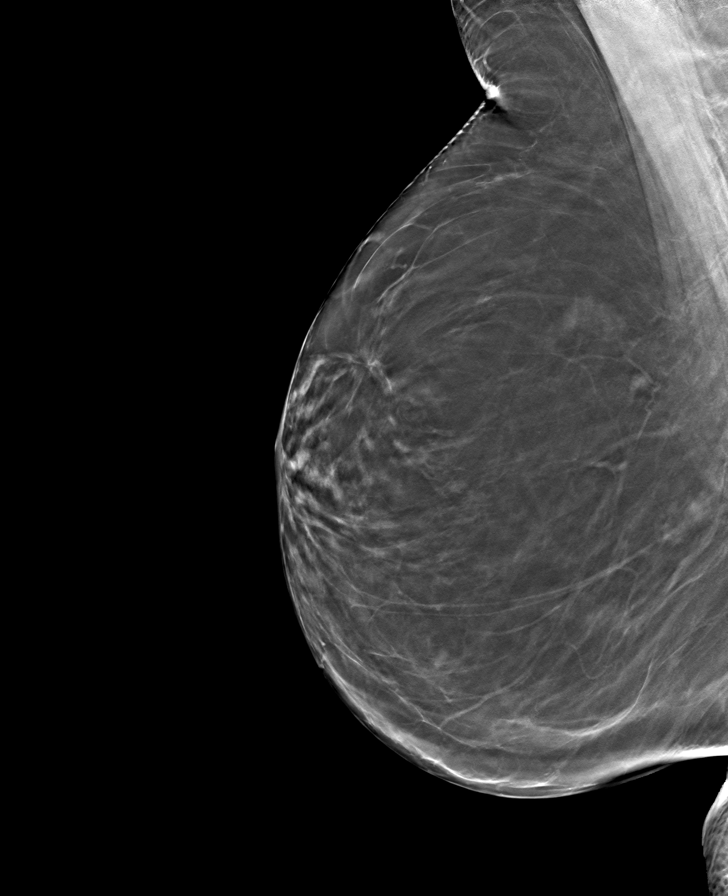

[8 of 24 positions shown; findings below may reference images not displayed]

ACR Breast Density Category b: There are scattered areas of
fibroglandular density.
FINDINGS: There are no findings suspicious for malignancy.
IMPRESSION: No mammographic evidence of malignancy. A result letter of this
screening mammogram will be mailed directly to the patient.

RECOMMENDATION:
Screening mammogram in one year. (Code:51-O-LD2)

BI-RADS CATEGORY  1: Negative.

## 2023-12-16 DIAGNOSIS — K080 Exfoliation of teeth due to systemic causes: Secondary | ICD-10-CM | POA: Diagnosis not present

## 2023-12-20 ENCOUNTER — Telehealth: Payer: Self-pay

## 2023-12-20 NOTE — Telephone Encounter (Signed)
 Confirmed with practice administrator that we do not accept this insurance. Routing to [place] for any possible recommendations.    Copied from CRM [id number]. Topic: General - Other >> Dec 19, 2023  1:00 PM Breanna Davidson wrote: Reason for CRM: The patient's insurance changed to [facility] now and [facility] is no longer in network. She is requesting to see if NP Aura Dials can recommend a provider in the [facility] or [facility] system as she is not comfortable switching providers. Her call back number is [phone].

## 2023-12-20 NOTE — Telephone Encounter (Signed)
 Spoke to patient on telephone in regard to insurance.  She has [employer] Medicare PPO, reviewed with Laurelyn Sickle and this is taken here and she can continue care with PCP.  She will bring ID into office to scan into chart.

## 2024-03-02 ENCOUNTER — Ambulatory Visit: Payer: Self-pay | Admitting: *Deleted

## 2024-03-02 NOTE — Telephone Encounter (Signed)
 Copied from CRM [id number]. Topic: Clinical - Medical Advice >> Mar 02, 2024 10:20 AM Antwanette L wrote: Reason for CRM: Patient is experiencing a cough, stuffy nose, and watery eyes. Patients wants to know if Jolene Cannady can prescribe some medicine for the cough. The patient can be contacted by phone [phone].  Preferred Pharmacy [facility] [address] [place] [address] Phone: [phone] Fax: [phone] Reason for Disposition  [1] Continuous (nonstop) coughing interferes with work or school AND [2] no improvement using cough treatment per Care Advice  Answer Assessment - Initial Assessment Questions 1. ONSET: "When did the cough begin?"      I'm coughing a lot.   Started Wed. Night.   I have watery eyes and nasal congestion.   2. SEVERITY: "How bad is the cough today?"      I'm coughing a lot.   The coughing is bad.   My nose is stuff.    I took some Dayquil and it helped last night.    3. SPUTUM: "Describe the color of your sputum" (none, dry cough; clear, white, yellow, green)     Sm amt of white mucus.    4. HEMOPTYSIS: "Are you coughing up any blood?" If so ask: "How much?" (flecks, streaks, tablespoons, etc.)     Not asked 5. DIFFICULTY BREATHING: "Are you having difficulty breathing?" If Yes, ask: "How bad is it?" (e.g., mild, moderate, severe)    - MILD: No SOB at rest, mild SOB with walking, speaks normally in sentences, can lie down, no retractions, pulse < 100.    - MODERATE: SOB at rest, SOB with minimal exertion and prefers to sit, cannot lie down flat, speaks in phrases, mild retractions, audible wheezing, pulse 100-120.    - SEVERE: Very SOB at rest, speaks in single words, struggling to breathe, sitting hunched forward, retractions, pulse > 120      No shortness of breath or chest tightness 6. FEVER: "Do you have a fever?" If Yes, ask: "What is your temperature, how was it measured, and when did it start?"     No 7. CARDIAC HISTORY: "Do you have any  history of heart disease?" (e.g., heart attack, congestive heart failure)      Not asked 8. LUNG HISTORY: "Do you have any history of lung disease?"  (e.g., pulmonary embolus, asthma, emphysema)     No 9. PE RISK FACTORS: "Do you have a history of blood clots?" (or: recent major surgery, recent prolonged travel, bedridden)     Not asked 10. OTHER SYMPTOMS: "Do you have any other symptoms?" (e.g., runny nose, wheezing, chest pain)       Stuffy nose, watery eyes 11. PREGNANCY: "Is there any chance you are pregnant?" "When was your last menstrual period?"       N/A due to age 73. TRAVEL: "Have you traveled out of the country in the last month?" (e.g., travel history, exposures)       N/A  Protocols used: Cough - Acute Non-Productive-A-AH  Chief Complaint: Non productive cough, stuffy nose, watery eyes  Requesting cough medication. Symptoms: above Frequency: Since Wed. 02/29/2024 Pertinent Negatives: Patient denies fever or sore throat, no shortness of breath or chest tightness/pain Disposition: [] ED /[] Urgent Care (no appt availability in office) / [] Appointment(In office/virtual)/ []  [facility]/ [x] Home Care/ [] Refused Recommended Disposition /[] [facility]/ []  Follow-up with PCP Additional Notes: No appts available with any providers until Monday 03/05/2024.   I recommended a MyChart Urgent Virtual  Visit or the urgent care.   She has not tried any OTC cough medications.   She used Delsym before and it worked well for her so she is going to try that again.  She has an appt coming up with Jolene Cannady, NP on 03/08/2024 so going to use OTC medications and if they don't help before she sees Jolene on 5/15 she will go to the urgent care.

## 2024-03-03 NOTE — Patient Instructions (Signed)
Wegovy or Zepbound  Focus on DASH diet for high blood pressure or Mediterranean diet  Be Involved in Caring For Your Health:  Taking Medications When medications are taken as directed, they can greatly improve your health. But if they are not taken as prescribed, they may not work. In some cases, not taking them correctly can be harmful. To help ensure your treatment remains effective and safe, understand your medications and how to take them. Bring your medications to each visit for review by your provider.  Your lab results, notes, and after visit summary will be available on My Chart. We strongly encourage you to use this feature. If lab results are abnormal the clinic will contact you with the appropriate steps. If the clinic does not contact you assume the results are satisfactory. You can always view your results on My Chart. If you have questions regarding your health or results, please contact the clinic during office hours. You can also ask questions on My Chart.  We at [facility] are grateful that you chose us to provide your care. We strive to provide evidence-based and compassionate care and are always looking for feedback. If you get a survey from the clinic please complete this so we can hear your opinions.  Preventing High Cholesterol Cholesterol is a white, waxy substance similar to fat that the human body needs to help build cells. The liver makes all the cholesterol that a person's body needs. Having high cholesterol (hypercholesterolemia) increases your risk for heart disease and stroke. Extra or excess cholesterol comes from the food that you eat. High cholesterol can often be prevented with diet and lifestyle changes. If you already have high cholesterol, you can control it with diet, lifestyle changes, and medicines. How can high cholesterol affect me? If you have high cholesterol, fatty deposits (plaques) may build up on the walls of your blood vessels. The blood  vessels that carry blood away from your heart are called arteries. Plaques make the arteries narrower and stiffer. This in turn can: Restrict or block blood flow and cause blood clots to form. Increase your risk for heart attack and stroke. What can increase my risk for high cholesterol? This condition is more likely to develop in people who: Eat foods that are high in saturated fat or cholesterol. Saturated fat is mostly found in foods that come from animal sources. Are overweight. Are not getting enough exercise. Use products that contain nicotine or tobacco, such as cigarettes, e-cigarettes, and chewing tobacco. Have a family history of high cholesterol (familial hypercholesterolemia). What actions can I take to prevent this? Nutrition  Eat less saturated fat. Avoid trans fats (partially hydrogenated oils). These are often found in margarine and in some baked goods, fried foods, and snacks bought in packages. Avoid precooked or cured meat, such as bacon, sausages, or meat loaves. Avoid foods and drinks that have added sugars. Eat more fruits, vegetables, and whole grains. Choose healthy sources of protein, such as fish, poultry, lean cuts of red meat, beans, peas, lentils, and nuts. Choose healthy sources of fat, such as: Nuts. Vegetable oils, especially olive oil. Fish that have healthy fats, such as omega-3 fatty acids. These fish include mackerel or salmon. Lifestyle Lose weight if you are overweight. Maintaining a healthy body mass index (BMI) can help prevent or control high cholesterol. It can also lower your risk for diabetes and high blood pressure. Ask your health care provider to help you with a diet and exercise plan to lose   weight safely. Do not use any products that contain nicotine or tobacco. These products include cigarettes, chewing tobacco, and vaping devices, such as e-cigarettes. If you need help quitting, ask your health care provider. Alcohol use Do not drink  alcohol if: Your health care provider tells you not to drink. You are pregnant, may be pregnant, or are planning to become pregnant. If you drink alcohol: Limit how much you have to: 0-1 drink a day for women. 0-2 drinks a day for men. Know how much alcohol is in your drink. In the [place] one drink equals one 12 oz bottle of beer (355 mL), one 5 oz glass of wine (148 mL), or one 1 oz glass of hard liquor (44 mL). Activity  Get enough exercise. Do exercises as told by your health care provider. Each week, do at least 150 minutes of exercise that takes a medium level of effort (moderate-intensity exercise). This kind of exercise: Makes your heart beat faster while allowing you to still be able to talk. Can be done in short sessions several times a day or longer sessions a few times a week. For example, on 5 days each week, you could walk fast or ride your bike 3 times a day for 10 minutes each time. Medicines Your health care provider may recommend medicines to help lower cholesterol. This may be a medicine to lower the amount of cholesterol that your liver makes. You may need medicine if: Diet and lifestyle changes have not lowered your cholesterol enough. You have high cholesterol and other risk factors for heart disease or stroke. Take over-the-counter and prescription medicines only as told by your health care provider. General information Manage your risk factors for high cholesterol. Talk with your health care provider about all your risk factors and how to lower your risk. Manage other conditions that you have, such as diabetes or high blood pressure (hypertension). Have blood tests to check your cholesterol levels at regular points in time as told by your health care provider. Keep all follow-up visits. This is important. Where to find more information American Heart Association: www.heart.org National Heart, Lung, and Blood Institute: www.nhlbi.nih.gov Summary High cholesterol  increases your risk for heart disease and stroke. By keeping your cholesterol level low, you can reduce your risk for these conditions. High cholesterol can often be prevented with diet and lifestyle changes. Work with your health care provider to manage your risk factors, and have your blood tested regularly. This information is not intended to replace advice given to you by your health care provider. Make sure you discuss any questions you have with your health care provider. Document Revised: 05/14/2022 Document Reviewed: 12/15/2020 Elsevier Patient Education  2024 [employer]  

## 2024-03-08 ENCOUNTER — Encounter: Payer: Self-pay | Admitting: Nurse Practitioner

## 2024-03-08 ENCOUNTER — Ambulatory Visit (INDEPENDENT_AMBULATORY_CARE_PROVIDER_SITE_OTHER): Payer: Medicare Other | Admitting: Nurse Practitioner

## 2024-03-08 VITALS — BP 121/69 | HR 64 | Temp 98.1°F | Ht 62.0 in | Wt 156.8 lb

## 2024-03-08 DIAGNOSIS — E7800 Pure hypercholesterolemia, unspecified: Secondary | ICD-10-CM

## 2024-03-08 DIAGNOSIS — R030 Elevated blood-pressure reading, without diagnosis of hypertension: Secondary | ICD-10-CM | POA: Diagnosis not present

## 2024-03-08 DIAGNOSIS — M8588 Other specified disorders of bone density and structure, other site: Secondary | ICD-10-CM

## 2024-03-08 DIAGNOSIS — I700 Atherosclerosis of aorta: Secondary | ICD-10-CM | POA: Diagnosis not present

## 2024-03-08 DIAGNOSIS — R7989 Other specified abnormal findings of blood chemistry: Secondary | ICD-10-CM

## 2024-03-08 NOTE — Progress Notes (Signed)
 BP 121/69   Pulse 64   Temp 98.1 F (36.7 C) (Oral)   Ht 5\' 2"  (1.575 m)   Wt 156 lb 12.8 oz (71.1 kg)   LMP  (LMP Unknown)   SpO2 99%   BMI 28.68 kg/m    Subjective:    Patient ID: GANELL GALANOS, female    DOB: 1951/06/01, 73 y.o.   MRN: [id number]  HPI: JAMALIA WEARE is a 73 y.o. female  Chief Complaint  Patient presents with   Hyperlipidemia   Osteopenia   HYPERLIPIDEMIA Prefers not to take statin therapy, continues fish oil.  Aortic atherosclerosis noted on lumbar imaging 08/02/2018.  Has been changing diet with no processed food and small portion meals + using more olive oil.  Exercising twice a week.  Is losing weight with this. Hyperlipidemia status: good compliance Medication compliance: good compliance Past cholesterol meds: none Supplements: fish oil and red yeast rice Aspirin :  no The 10-year ASCVD risk score (Arnett DK, et al., 2019) is: 10.9%   Values used to calculate the score:     Age: 46 years     Sex: Female     Is Non-Hispanic African American: No     Diabetic: No     Tobacco smoker: No     Systolic Blood Pressure: 121 mmHg     Is BP treated: No     HDL Cholesterol: 79 mg/dL     Total Cholesterol: 274 mg/dL Chest pain:  no Coronary artery disease:  no Family history CAD: yes -- father lived until 33 with higher levels and no treatment Family history early CAD:  no   OSTEOPENIA DEXA with osteopenia noted, 10/15/21. Takes Vitamin D  2000 units daily.  Had one recent fall without injury. Satisfied with current treatment?: yes Adequate calcium  & vitamin D : yes Weight bearing exercises: yes      03/08/2024    8:13 AM 10/27/2023    2:02 PM 09/09/2023    8:53 AM 03/08/2023    8:23 AM 10/04/2022    8:41 AM  Depression screen PHQ 2/9  Decreased Interest 0 0 0 0 0  Down, Depressed, Hopeless 0 1 0 0 0  PHQ - 2 Score 0 1 0 0 0  Altered sleeping 0 0 0 0 1  Tired, decreased energy 0 0 1 1 1   Change in appetite 0 0 0 0 0  Feeling bad or failure  about yourself  0 0 0 0 0  Trouble concentrating 0 0 0 0 0  Moving slowly or fidgety/restless 0 0 0 0 0  Suicidal thoughts 0 0 0 0 0  PHQ-9 Score 0 1 1 1 2   Difficult doing work/chores Not difficult at all Not difficult at all Not difficult at all Not difficult at all Not difficult at all       03/08/2024    8:13 AM 09/09/2023    8:54 AM 03/08/2023    8:23 AM 09/07/2022    1:10 PM  GAD 7 : Generalized Anxiety Score  Nervous, Anxious, on Edge 0 0 0 0  Control/stop worrying 0 0 0 0  Worry too much - different things 0 0 0 0  Trouble relaxing 0 0 0 0  Restless 0 0 0 0  Easily annoyed or irritable 0 0 0 0  Afraid - awful might happen 0 0 0 0  Total GAD 7 Score 0 0 0 0  Anxiety Difficulty Not difficult at all Not difficult at all Not  difficult at all Not difficult at all   Relevant past medical, surgical, family and social history reviewed and updated as indicated. Interim medical history since our last visit reviewed. Allergies and medications reviewed and updated.  Review of Systems  Constitutional:  Negative for activity change, appetite change, diaphoresis, fatigue and fever.  Respiratory:  Negative for cough, chest tightness and shortness of breath.   Cardiovascular:  Negative for chest pain, palpitations and leg swelling.  Gastrointestinal: Negative.   Neurological: Negative.   Psychiatric/Behavioral: Negative.     Per HPI unless specifically indicated above     Objective:    BP 121/69   Pulse 64   Temp 98.1 F (36.7 C) (Oral)   Ht 5\' 2"  (1.575 m)   Wt 156 lb 12.8 oz (71.1 kg)   LMP  (LMP Unknown)   SpO2 99%   BMI 28.68 kg/m   Wt Readings from Last 3 Encounters:  03/08/24 156 lb 12.8 oz (71.1 kg)  10/27/23 160 lb (72.6 kg)  10/05/23 160 lb 9.6 oz (72.8 kg)    Physical Exam Vitals and nursing note reviewed.  Constitutional:      General: She is awake. She is not in acute distress.    Appearance: Normal appearance. She is well-developed and well-groomed. She is  not ill-appearing or toxic-appearing.  HENT:     Head: Normocephalic.     Right Ear: Hearing, ear canal and external ear normal.     Left Ear: Hearing, ear canal and external ear normal.  Eyes:     General: Lids are normal.        Right eye: No discharge.        Left eye: No discharge.     Conjunctiva/sclera: Conjunctivae normal.     Pupils: Pupils are equal, round, and reactive to light.  Neck:     Thyroid : No thyromegaly.     Vascular: No carotid bruit.  Cardiovascular:     Rate and Rhythm: Normal rate and regular rhythm.     Heart sounds: Normal heart sounds. No murmur heard.    No gallop.  Pulmonary:     Effort: Pulmonary effort is normal. No accessory muscle usage or respiratory distress.     Breath sounds: Normal breath sounds.  Abdominal:     General: Bowel sounds are normal.     Palpations: Abdomen is soft.  Musculoskeletal:     Cervical back: Neck supple. No rigidity. No pain with movement or spinous process tenderness. Normal range of motion.     Right lower leg: No edema.     Left lower leg: No edema.  Lymphadenopathy:     Cervical: No cervical adenopathy.  Skin:    General: Skin is warm and dry.  Neurological:     Mental Status: She is alert and oriented to person, place, and time.  Psychiatric:        Attention and Perception: Attention normal.        Mood and Affect: Mood normal.        Behavior: Behavior normal. Behavior is cooperative.        Thought Content: Thought content normal.        Judgment: Judgment normal.    Results for orders placed or performed during the hospital encounter of 10/05/23  Surgical pathology   Collection Time: 10/05/23 12:00 AM  Result Value Ref Range   SURGICAL PATHOLOGY      SURGICAL PATHOLOGY [facility] [address], [address] [place], [place]  [address] Telephone [phone] or [phone] Fax [phone]  REPORT OF SURGICAL PATHOLOGY   Accession #: [id number] Patient Name: SCHARLOTTE, GUZY Visit # : [id number]  MRN: [id number] Physician: Marnee Sink DOB/Age 20-Jan-1951 (Age: 42) Gender: F Collected Date: 10/05/2023 Received Date: 10/05/2023  FINAL DIAGNOSIS       1. Transverse Colon Polyp, cold snare :       - TUBULAR ADENOMA.      - NEGATIVE FOR HIGH-GRADE DYSPLASIA AND MALIGNANCY.       DATE SIGNED OUT: 10/06/2023 ELECTRONIC SIGNATURE : Brunetta Capes Md, Alexandria Ida , Pathologist, Electronic Signature  MICROSCOPIC DESCRIPTION  CASE COMMENTS STAINS USED IN DIAGNOSIS: H&E    CLINICAL HISTORY  SPECIMEN(S) OBTAINED 1. Transverse Colon Polyp, Cold Snare  SPECIMEN COMMENTS: SPECIMEN CLINICAL INFORMATION: 1. History of colon polyps, colon polyp    Gross Description 1. Received in formalin label ed with the patient's name and "Transverse colon polyp" are three 0.2-0.3 cm pieces of tan soft tissue, submitted in toto in a single cassette.               (LEF, 10/05/2023)        Report signed out from the following location(s) [place] [address] [place], [place] [address] CLIA #: [id number]  [facility] [address] [place], [place] [address] CLIA #: [id number]       Assessment & Plan:   Problem List Items Addressed This Visit       Cardiovascular and Mediastinum   Aortic atherosclerosis (HCC) - Primary   Chronic.  Noted on lumbar imaging in October 2019.  Continue focus on healthy diet and exercise for prevention.  Does not wish to start statin or ASA.      Relevant Orders   Comprehensive metabolic panel with GFR   Lipid Panel w/o Chol/HDL Ratio   CT CARDIAC SCORING (SELF PAY ONLY)     Musculoskeletal and Integument   Osteopenia   Chronic, ongoing.  Recent DEXA remains stable, no worsening.  Continue supplements and check labs at physical.  Repeat DEXA around 10/15/2026.        Other   Hypercholesteremia   Chronic, aortic atherosclerosis noted on lumbar imaging. She is hesitant to start  statins and is currently focusing on diet, exercise, and taking an omega 3 supplement. Will check lipid panel today.  She would like to go for CT cardiac scoring, which we discussed today. The 10-year ASCVD risk score (Arnett DK, et al., 2019) is: 10.9%   Values used to calculate the score:     Age: 55 years     Sex: Female     Is Non-Hispanic African American: No     Diabetic: No     Tobacco smoker: No     Systolic Blood Pressure: 121 mmHg     Is BP treated: No     HDL Cholesterol: 79 mg/dL     Total Cholesterol: 274 mg/dL       Relevant Orders   Comprehensive metabolic panel with GFR   Lipid Panel w/o Chol/HDL Ratio   CT CARDIAC SCORING (SELF PAY ONLY)   Elevated blood-pressure reading, without diagnosis of hypertension   Stable with BP at goal.  Continue diet focus and regular activity.  Labs today.        Follow up plan: Return in about 6 months (around 09/08/2024) for Annual Physical -- after 09/08/24.

## 2024-03-08 NOTE — Assessment & Plan Note (Signed)
Stable with BP at goal.  Continue diet focus and regular activity.  Labs today.

## 2024-03-08 NOTE — Assessment & Plan Note (Signed)
 Chronic, aortic atherosclerosis noted on lumbar imaging. She is hesitant to start statins and is currently focusing on diet, exercise, and taking an omega 3 supplement. Will check lipid panel today.  She would like to go for CT cardiac scoring, which we discussed today. The 10-year ASCVD risk score (Arnett DK, et al., 2019) is: 10.9%   Values used to calculate the score:     Age: 73 years     Sex: Female     Is Non-Hispanic African American: No     Diabetic: No     Tobacco smoker: No     Systolic Blood Pressure: 121 mmHg     Is BP treated: No     HDL Cholesterol: 79 mg/dL     Total Cholesterol: 274 mg/dL

## 2024-03-08 NOTE — Assessment & Plan Note (Signed)
Chronic.  Noted on lumbar imaging in October 2019.  Continue focus on healthy diet and exercise for prevention.  Does not wish to start statin or ASA. 

## 2024-03-08 NOTE — Assessment & Plan Note (Addendum)
 Chronic, ongoing.  Recent DEXA remains stable, no worsening.  Continue supplements and check labs at physical.  Repeat DEXA around 10/15/2026.

## 2024-03-09 ENCOUNTER — Ambulatory Visit: Payer: Self-pay | Admitting: Nurse Practitioner

## 2024-03-09 LAB — COMPREHENSIVE METABOLIC PANEL WITH GFR
ALT: 15 IU/L (ref 0–32)
AST: 16 IU/L (ref 0–40)
Albumin: 4 g/dL (ref 3.8–4.8)
Alkaline Phosphatase: 71 IU/L (ref 44–121)
BUN/Creatinine Ratio: 21 (ref 12–28)
BUN: 16 mg/dL (ref 8–27)
Bilirubin Total: 0.3 mg/dL (ref 0.0–1.2)
CO2: 22 mmol/L (ref 20–29)
Calcium: 8.8 mg/dL (ref 8.7–10.3)
Chloride: 104 mmol/L (ref 96–106)
Creatinine, Ser: 0.75 mg/dL (ref 0.57–1.00)
Globulin, Total: 2.1 g/dL (ref 1.5–4.5)
Glucose: 87 mg/dL (ref 70–99)
Potassium: 4.4 mmol/L (ref 3.5–5.2)
Sodium: 141 mmol/L (ref 134–144)
Total Protein: 6.1 g/dL (ref 6.0–8.5)
eGFR: 85 mL/min/{1.73_m2} (ref >59)

## 2024-03-09 LAB — LIPID PANEL W/O CHOL/HDL RATIO
Cholesterol, Total: 236 mg/dL — ABNORMAL HIGH (ref 100–199)
HDL: 53 mg/dL (ref >39)
LDL Chol Calc (NIH): 169 mg/dL — ABNORMAL HIGH (ref 0–99)
Triglycerides: 83 mg/dL (ref 0–149)
VLDL Cholesterol Cal: 14 mg/dL (ref 5–40)

## 2024-03-09 NOTE — Progress Notes (Signed)
 Contacted via MyChart   Good morning Breanna Davidson, your labs have returned: - Kidney function, creatinine and eGFR, remains normal, as is liver function, AST and ALT.  - Lipid panel is showing ongoing elevations, we will see what imaging shows.  Any questions? Keep being amazing!!  Thank you for allowing me to participate in your care.  I appreciate you. Kindest regards, Wallie Lagrand

## 2024-03-14 ENCOUNTER — Ambulatory Visit
Admission: RE | Admit: 2024-03-14 | Discharge: 2024-03-14 | Disposition: A | Discharge status: Home / Self Care | Payer: Self-pay | Source: Ambulatory Visit | Attending: Nurse Practitioner | Admitting: Nurse Practitioner

## 2024-03-14 DIAGNOSIS — E7800 Pure hypercholesterolemia, unspecified: Secondary | ICD-10-CM | POA: Insufficient documentation

## 2024-03-14 DIAGNOSIS — I700 Atherosclerosis of aorta: Secondary | ICD-10-CM | POA: Insufficient documentation

## 2024-03-14 NOTE — Progress Notes (Signed)
 Spoke to patient on telephone and informed her of score of 0 and to continue diet and exercise focus for HLD.

## 2024-04-19 ENCOUNTER — Ambulatory Visit
Admission: EM | Admit: 2024-04-19 | Discharge: 2024-04-19 | Disposition: A | Discharge status: Home / Self Care | Attending: Physician Assistant | Admitting: Physician Assistant

## 2024-04-19 ENCOUNTER — Ambulatory Visit (INDEPENDENT_AMBULATORY_CARE_PROVIDER_SITE_OTHER)

## 2024-04-19 DIAGNOSIS — M20011 Mallet finger of right finger(s): Secondary | ICD-10-CM | POA: Diagnosis not present

## 2024-04-19 DIAGNOSIS — M79644 Pain in right finger(s): Secondary | ICD-10-CM

## 2024-04-19 DIAGNOSIS — M19041 Primary osteoarthritis, right hand: Secondary | ICD-10-CM | POA: Diagnosis not present

## 2024-04-19 NOTE — ED Triage Notes (Signed)
 Pt c/o right 3rd finger injury  Pt states that she tried to grab the seat while sitting on a boat, and heard a pop when her finger bent backwards  Pt is unable to straighten her finger and states that the top of her finger hurts.

## 2024-04-19 NOTE — ED Provider Notes (Signed)
 [facility]    CSN: [id number] Arrival date & time: 04/19/24  1556      History   Chief Complaint Chief Complaint  Patient presents with   Finger Injury         HPI Breanna Davidson is a 73 y.o. female presenting for pain and swelling of the distal right third digit.  Patient reports she was trying to grab a seat while sitting down on a boat but she jammed her finger straight down.  This occurred today.  States she cannot fully extend the tip of her finger.  There is associated bruising and swelling.  She has had the finger buddy taped to the other finger.  Has not had any numbness.  HPI  Past Medical History:  Diagnosis Date   Allergy    Chest pain    2017 ED VISIT FOR CHEST PAIN , R/O AS GASTRIC CAUSE    Depression    per NP notes, on cymbalta    Headache    per patient very rare sometimes when not well rested   Hyperlipidemia    Increased pressure in the eye    THEY DID SURGERY ON MY EYES TO REDUCE THE PRESSURE , THEY MADE TWO HOLES IN MY EYES    Osteopenia    Spinal stenosis    LUMBAR    Vertigo    IMPROVED BUT HAS OCCASIONAL SX    Patient Active Problem List   Diagnosis Date Noted   History of colonic polyps 10/05/2023   Polyp of transverse colon 10/05/2023   Cervical spondylosis with radiculopathy 10/13/2021   H/O total shoulder replacement, 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   Lipoma of colon    Aortic atherosclerosis (HCC) 07/27/2020   Elevated blood-pressure reading, without diagnosis of hypertension 11/19/2019   History of hip replacement, total, 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   History of lumbar fusion 12/12/2018   DDD (degenerative disc disease), lumbar 12/12/2018   BMI 29.0-29.9,adult 07/23/2016   Hypercholesteremia 09/10/2015   Osteopenia 09/10/2015   Allergic rhinitis 09/10/2015    Past Surgical History:  Procedure Laterality Date   APPENDECTOMY     BACK SURGERY  11/2019   CESAREAN SECTION     COLONOSCOPY WITH PROPOFOL  N/A 08/26/2020   Procedure:  COLONOSCOPY WITH PROPOFOL ;  Surgeon: Janalyn Keene NOVAK, MD;  Location: [facility];  Service: Endoscopy;  Laterality: N/A;   COLONOSCOPY WITH PROPOFOL  N/A 10/05/2023   Procedure: COLONOSCOPY WITH PROPOFOL ;  Surgeon: Jinny Carmine, MD;  Location: [facility];  Service: Endoscopy;  Laterality: N/A;   DILATION AND CURETTAGE OF UTERUS     EYE SURGERY     THEY DID SURGERY ON MY EYES TO REDUCE THE PRESSURE , THEY MADE TWO HOLES IN MY EYES    JOINT REPLACEMENT     POLYPECTOMY  10/05/2023   Procedure: POLYPECTOMY;  Surgeon: Jinny Carmine, MD;  Location: [facility];  Service: Endoscopy;;   REDUCTION MAMMAPLASTY  1988   SPINE SURGERY     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   Procedure: TOTAL HIP ARTHROPLASTY ANTERIOR APPROACH;  Surgeon: Beverley Evalene BIRCH, MD;  Location: [facility];  Service: Orthopedics;  Laterality: Left;    OB History     Gravida  3   Para  1   Term  1   Preterm      AB  2   Living  1      SAB  1   IAB  1   Ectopic      Multiple  Live Births               Home Medications    Prior to Admission medications   Medication Sig Start Date End Date Taking? Authorizing Provider  albuterol  (VENTOLIN  HFA) 108 (90 Base) MCG/ACT inhaler Inhale 1-2 puffs into the lungs every 6 (six) hours as needed for wheezing or shortness of breath. 11/22/22  Yes Arvis Huxley B, PA-C  CALCIUM -MAGNESIUM  PO Take 1 tablet by mouth daily.   Yes [provider]  Cholecalciferol  (VITAMIN D ) 50 MCG (2000 UT) CAPS Take 2,000 Units by mouth daily.   Yes [provider]  dorzolamide-timolol  (COSOPT) 2-0.5 % ophthalmic solution Place 1 drop into both eyes 2 (two) times daily. 07/05/23  Yes [provider]  Multiple Vitamins-Minerals (ZINC PO) Take 1 tablet by mouth daily.   Yes [provider]  Omega-3 Fatty Acids (FISH OIL) 1000 MG CAPS Take 1,000 mg by mouth daily.   Yes [provider]  polyethylene glycol (MIRALAX  / GLYCOLAX )  17 g packet Take 17 g by mouth daily as needed.   Yes [provider]    Family History Family History  Problem Relation Age of Onset   Breast cancer Mother 83       and again at 69   Lung disease Mother    Cancer Mother    Emphysema Mother    CAD Father    Hyperlipidemia Sister    Mental illness Daughter        bipolar   Cystic fibrosis Daughter    Breast cancer Maternal Aunt    Breast cancer Maternal Grandmother     Social History Social History   Tobacco Use   Smoking status: Never   Smokeless tobacco: Never  Vaping Use   Vaping status: Never Used  Substance Use Topics   Alcohol use: Yes    Alcohol/week: 1.0 standard drink of alcohol    Types: 1 Shots of liquor per week    Comment: 1 glass of wine daily with dinner   Drug use: No     Allergies   Combigan  [brimonidine  tartrate-timolol ] and Prednisone   Review of Systems Review of Systems  Musculoskeletal:  Positive for arthralgias and joint swelling.  Skin:  Negative for color change and wound.  Neurological:  Negative for weakness and numbness.     Physical Exam Triage Vital Signs ED Triage Vitals  Encounter Vitals Group     BP 04/19/24 1606 (!) 156/91     Girls Systolic BP Percentile --      Girls Diastolic BP Percentile --      Boys Systolic BP Percentile --      Boys Diastolic BP Percentile --      Pulse Rate 04/19/24 1606 (!) 50     Resp --      Temp 04/19/24 1606 97.9 F (36.6 C)     Temp Source 04/19/24 1606 Oral     SpO2 04/19/24 1606 98 %     Weight 04/19/24 1605 156 lb (70.8 kg)     Height 04/19/24 1605 5' 2 (1.575 m)     Head Circumference --      Peak Flow --      Pain Score 04/19/24 1604 5     Pain Loc --      Pain Education --      Exclude from Growth Chart --    No data found.  Updated Vital Signs BP (!) 156/91 (BP Location: Left Arm)  Pulse (!) 50   Temp 97.9 F (36.6 C) (Oral)   Ht 5' 2 (1.575 m)   Wt 156 lb (70.8 kg)   LMP  (LMP Unknown)   SpO2 98%    BMI 28.53 kg/m   Physical Exam Vitals and nursing note reviewed.  Constitutional:      General: She is not in acute distress.    Appearance: Normal appearance. She is not ill-appearing or toxic-appearing.  HENT:     Head: Normocephalic and atraumatic.   Eyes:     General: No scleral icterus.       Right eye: No discharge.        Left eye: No discharge.     Conjunctiva/sclera: Conjunctivae normal.    Cardiovascular:     Rate and Rhythm: Bradycardia present.     Pulses: Normal pulses.  Pulmonary:     Effort: Pulmonary effort is normal. No respiratory distress.   Musculoskeletal:     Cervical back: Neck supple.     Comments: Right hand: There is swelling and contusion of the third digit.  Tenderness of the DIP and distal phalanx.  Unable to fully extend at the DIP joint.  All other joints are normal with full range of motion.  Good cap refill and pulses.   Skin:    General: Skin is dry.   Neurological:     General: No focal deficit present.     Mental Status: She is alert. Mental status is at baseline.     Motor: No weakness.     Gait: Gait normal.   Psychiatric:        Mood and Affect: Mood normal.        Behavior: Behavior normal.      UC Treatments / Results  Labs (all labs ordered are listed, but only abnormal results are displayed) Labs Reviewed - No data to display  EKG   Radiology DG Finger Middle Right Result Date: 04/19/2024 CLINICAL DATA:  Pain.  Injury EXAM: RIGHT MIDDLE FINGER 3V COMPARISON:  None Available. FINDINGS: No fracture or dislocation. Preserved bone mineralization. Minimal degenerative changes of the distal interphalangeal joint. IMPRESSION: No acute osseous abnormality. Electronically Signed   By: Ranell Bring M.D.   On: 04/19/2024 16:23    Procedures Procedures (including critical care time)  Medications Ordered in UC Medications - No data to display  Initial Impression / Assessment and Plan / UC Course  I have reviewed the triage  vital signs and the nursing notes.  Pertinent labs & imaging results that were available during my care of the patient were reviewed by me and considered in my medical decision making (see chart for details).   73 year old female presents for right middle finger injury after jamming the finger on a boat today.  X-ray of finger obtained shows minimal degenerative changes but no acute osseous abnormality.  Reviewed this with the patient.  Mallet finger.  Referral placed to hand surgery.  Patient placed in splint.  Reviewed rest guidelines and discussed use of OTC meds for pain relief.  Other Ortho information provided in case hand surgery referral does not go through.  Advise avoiding painful activities.  Reviewed return precautions.   Final Clinical Impressions(s) / UC Diagnoses   Final diagnoses:  Finger pain, right  Mallet finger of right hand     Discharge Instructions      - X-rays were negative for any fractures. - You have an injury to the tendon of your finger. - I  put a referral in to hand surgery for you.  I also provided contact information for a couple orthopedics offices below.  Try to call and make an appointment with 1 of these offices if you are not contacted by hand surgery or are unable to see them. - Ice the area.  May take Tylenol  or ibuprofen as needed for pain. - Wear the splint until you are seen by Ortho.  You have a condition requiring you to follow up with Orthopedics so please call one of the following office for appointment:   [facility] Address: [address], [place], [place] [address] Phone: [phone]  [facility] [address], [place], [place] [address] Phone: [phone]  [facility] [address] [place], [place] [address] Phone: [phone]    ED Prescriptions   None    PDMP not reviewed this encounter.   Arvis Jolan NOVAK, PA-C 04/19/24 1825

## 2024-04-19 NOTE — Discharge Instructions (Addendum)
-   X-rays were negative for any fractures. - You have an injury to the tendon of your finger. - I put a referral in to hand surgery for you.  I also provided contact information for a couple orthopedics offices below.  Try to call and make an appointment with 1 of these offices if you are not contacted by hand surgery or are unable to see them. - Ice the area.  May take Tylenol  or ibuprofen as needed for pain. - Wear the splint until you are seen by Ortho.  You have a condition requiring you to follow up with Orthopedics so please call one of the following office for appointment:   [facility] Address: [address] [place], [place] [address] Phone: [phone]  [facility] [address], [place], [place] [address] Phone: [phone]  [facility] [address] [place], [place] [address] Phone: [phone]

## 2024-04-20 DIAGNOSIS — M20011 Mallet finger of right finger(s): Secondary | ICD-10-CM | POA: Diagnosis not present

## 2024-04-24 DIAGNOSIS — M20011 Mallet finger of right finger(s): Secondary | ICD-10-CM | POA: Diagnosis not present

## 2024-04-30 DIAGNOSIS — M20011 Mallet finger of right finger(s): Secondary | ICD-10-CM | POA: Diagnosis not present

## 2024-06-28 DIAGNOSIS — K080 Exfoliation of teeth due to systemic causes: Secondary | ICD-10-CM | POA: Diagnosis not present

## 2024-07-02 DIAGNOSIS — M20011 Mallet finger of right finger(s): Secondary | ICD-10-CM | POA: Diagnosis not present

## 2024-07-02 DIAGNOSIS — M19041 Primary osteoarthritis, right hand: Secondary | ICD-10-CM | POA: Diagnosis not present

## 2024-09-21 NOTE — Patient Instructions (Signed)
 Be Involved in Caring For Your Health:  Taking Medications When medications are taken as directed, they can greatly improve your health. But if they are not taken as prescribed, they may not work. In some cases, not taking them correctly can be harmful. To help ensure your treatment remains effective and safe, understand your medications and how to take them. Bring your medications to each visit for review by your provider.  Your lab results, notes, and after visit summary will be available on My Chart. We strongly encourage you to use this feature. If lab results are abnormal the clinic will contact you with the appropriate steps. If the clinic does not contact you assume the results are satisfactory. You can always view your results on My Chart. If you have questions regarding your health or results, please contact the clinic during office hours. You can also ask questions on My Chart.  We at [facility] are grateful that you chose us  to provide your care. We strive to provide evidence-based and compassionate care and are always looking for feedback. If you get a survey from the clinic please complete this so we can hear your opinions.  Healthy Eating, Adult Healthy eating may help you get and keep a healthy body weight, reduce the risk of chronic disease, and live a long and productive life. It is important to follow a healthy eating pattern. Your nutritional and calorie needs should be met mainly by different nutrient-rich foods. What are tips for following this plan? Reading food labels Read labels and choose the following: Reduced or low sodium products. Juices with 100% fruit juice. Foods with low saturated fats (<3 g per serving) and high polyunsaturated and monounsaturated fats. Foods with whole grains, such as whole wheat, cracked wheat, brown rice, and wild rice. Whole grains that are fortified with folic acid. This is recommended for females who are pregnant or who want to  become pregnant. Read labels and do not eat or drink the following: Foods or drinks with added sugars. These include foods that contain brown sugar, corn sweetener, corn syrup, dextrose , fructose, glucose, high-fructose corn syrup, honey, invert sugar, lactose, malt syrup, maltose, molasses, raw sugar, sucrose, trehalose, or turbinado sugar. Limit your intake of added sugars to less than 10% of your total daily calories. Do not eat more than the following amounts of added sugar per day: 6 teaspoons (25 g) for females. 9 teaspoons (38 g) for males. Foods that contain processed or refined starches and grains. Refined grain products, such as white flour, degermed cornmeal, white bread, and white rice. Shopping Choose nutrient-rich snacks, such as vegetables, whole fruits, and nuts. Avoid high-calorie and high-sugar snacks, such as potato chips, fruit snacks, and candy. Use oil-based dressings and spreads on foods instead of solid fats such as butter, margarine, sour cream, or cream cheese. Limit pre-made sauces, mixes, and instant products such as flavored rice, instant noodles, and ready-made pasta. Try more plant-protein sources, such as tofu, tempeh, black beans, edamame, lentils, nuts, and seeds. Explore eating plans such as the Mediterranean diet or vegetarian diet. Try heart-healthy dips made with beans and healthy fats like hummus and guacamole. Vegetables go great with these. Cooking Use oil to saut or stir-fry foods instead of solid fats such as butter, margarine, or lard. Try baking, boiling, grilling, or broiling instead of frying. Remove the fatty part of meats before cooking. Steam vegetables in water  or broth. Meal planning  At meals, imagine dividing your plate into fourths: One-half of  your plate is fruits and vegetables. One-fourth of your plate is whole grains. One-fourth of your plate is protein, especially lean meats, poultry, eggs, tofu, beans, or nuts. Include low-fat  dairy as part of your daily diet. Lifestyle Choose healthy options in all settings, including home, work, school, restaurants, or stores. Prepare your food safely: Wash your hands after handling raw meats. Where you prepare food, keep surfaces clean by regularly washing with hot, soapy water . Keep raw meats separate from ready-to-eat foods, such as fruits and vegetables. Cook seafood, meat, poultry, and eggs to the recommended temperature. Get a food thermometer. Store foods at safe temperatures. In general: Keep cold foods at 84F (4.4C) or below. Keep hot foods at 184F (60C) or above. Keep your freezer at [facility] (-17.8C) or below. Foods are not safe to eat if they have been between the temperatures of 40-184F (4.4-60C) for more than 2 hours. What foods should I eat? Fruits Aim to eat 1-2 cups of fresh, canned (in natural juice), or frozen fruits each day. One cup of fruit equals 1 small apple, 1 large banana, 8 large strawberries, 1 cup (237 g) canned fruit,  cup (82 g) dried fruit, or 1 cup (240 mL) 100% juice. Vegetables Aim to eat 2-4 cups of fresh and frozen vegetables each day, including different varieties and colors. One cup of vegetables equals 1 cup (91 g) broccoli or cauliflower florets, 2 medium carrots, 2 cups (150 g) raw, leafy greens, 1 large tomato, 1 large bell pepper, 1 large sweet potato, or 1 medium white potato. Grains Aim to eat 5-10 ounce-equivalents of whole grains each day. Examples of 1 ounce-equivalent of grains include 1 slice of bread, 1 cup (40 g) ready-to-eat cereal, 3 cups (24 g) popcorn, or  cup (93 g) cooked rice. Meats and other proteins Try to eat 5-7 ounce-equivalents of protein each day. Examples of 1 ounce-equivalent of protein include 1 egg,  oz nuts (12 almonds, 24 pistachios, or 7 walnut halves), 1/4 cup (90 g) cooked beans, 6 tablespoons (90 g) hummus or 1 tablespoon (16 g) peanut butter. A cut of meat or fish that is the size of a deck of  cards is about 3-4 ounce-equivalents (85 g). Of the protein you eat each week, try to have at least 8 sounce (227 g) of seafood. This is about 2 servings per week. This includes salmon, trout, herring, sardines, and anchovies. Dairy Aim to eat 3 cup-equivalents of fat-free or low-fat dairy each day. Examples of 1 cup-equivalent of dairy include 1 cup (240 mL) milk, 8 ounces (250 g) yogurt, 1 ounces (44 g) natural cheese, or 1 cup (240 mL) fortified soy milk. Fats and oils Aim for about 5 teaspoons (21 g) of fats and oils per day. Choose monounsaturated fats, such as canola and olive oils, mayonnaise made with olive oil or avocado oil, avocados, peanut butter, and most nuts, or polyunsaturated fats, such as sunflower, corn, and soybean oils, walnuts, pine nuts, sesame seeds, sunflower seeds, and flaxseed. Beverages Aim for 6 eight-ounce glasses of water  per day. Limit coffee to 3-5 eight-ounce cups per day. Limit caffeinated beverages that have added calories, such as soda and energy drinks. If you drink alcohol: Limit how much you have to: 0-1 drink a day if you are female. 0-2 drinks a day if you are female. Know how much alcohol is in your drink. In the [place] one drink is one 12 oz bottle of beer (355 mL), one 5 oz glass of wine (  148 mL), or one 1 oz glass of hard liquor (44 mL). Seasoning and other foods Try not to add too much salt to your food. Try using herbs and spices instead of salt. Try not to add sugar to food. This information is based on [place] nutrition guidelines. To learn more, visit [employer]. Exact amounts may vary. You may need different amounts. This information is not intended to replace advice given to you by your health care provider. Make sure you discuss any questions you have with your health care provider. Document Revised: 07/12/2022 Document Reviewed: 07/12/2022 Elsevier Patient Education  2024 [employer].

## 2024-09-24 ENCOUNTER — Ambulatory Visit (INDEPENDENT_AMBULATORY_CARE_PROVIDER_SITE_OTHER): Admitting: Nurse Practitioner

## 2024-09-24 ENCOUNTER — Encounter: Payer: Self-pay | Admitting: Nurse Practitioner

## 2024-09-24 VITALS — BP 118/73 | HR 61 | Temp 97.9°F | Resp 16 | Ht 62.01 in | Wt 159.0 lb

## 2024-09-24 DIAGNOSIS — E7800 Pure hypercholesterolemia, unspecified: Secondary | ICD-10-CM | POA: Diagnosis not present

## 2024-09-24 DIAGNOSIS — Z6829 Body mass index (BMI) 29.0-29.9, adult: Secondary | ICD-10-CM

## 2024-09-24 DIAGNOSIS — I700 Atherosclerosis of aorta: Secondary | ICD-10-CM

## 2024-09-24 DIAGNOSIS — Z0000 Encounter for general adult medical examination without abnormal findings: Secondary | ICD-10-CM

## 2024-09-24 DIAGNOSIS — Z1231 Encounter for screening mammogram for malignant neoplasm of breast: Secondary | ICD-10-CM

## 2024-09-24 DIAGNOSIS — M8588 Other specified disorders of bone density and structure, other site: Secondary | ICD-10-CM

## 2024-09-24 DIAGNOSIS — R030 Elevated blood-pressure reading, without diagnosis of hypertension: Secondary | ICD-10-CM | POA: Diagnosis not present

## 2024-09-24 DIAGNOSIS — Z23 Encounter for immunization: Secondary | ICD-10-CM

## 2024-09-24 MED ORDER — NYSTATIN 100000 UNIT/GM EX POWD: 1.0000 | Freq: Two times a day (BID) | CUTANEOUS | 1 refills | Status: AC | PRN

## 2024-09-24 NOTE — Assessment & Plan Note (Signed)
 Chronic.  Noted on lumbar imaging in October 2019.  Continue focus on healthy diet and exercise for prevention.  Does not wish to start statin or ASA. CT Cardiac Scoring 0 in May 2025.

## 2024-09-24 NOTE — Progress Notes (Signed)
 BP 118/73 (BP Location: Left Arm, Patient Position: Sitting, Cuff Size: Normal)   Pulse 61   Temp 97.9 F (36.6 C) (Oral)   Resp 16   Ht 5' 2.01 (1.575 m)   Wt 159 lb (72.1 kg)   LMP  (LMP Unknown)   SpO2 98%   BMI 29.07 kg/m    Subjective:    Patient ID: Breanna Davidson, female    DOB: 1951/01/25, 73 y.o.   MRN: [id number]  HPI: Breanna Davidson is a 73 y.o. female presenting on 09/24/2024 for Annual physical exam. Current medical complaints include:none  She currently lives with: husband Menopausal Symptoms: no  HYPERLIPIDEMIA Does not wish to take statin therapy, does take supplements. Aortic atherosclerosis on lumbar imaging 08/02/2018.  Had CT Cardiac Scoring on 03/14/24 that did show score of 0, recommend for heart healthy lifestyle.  She has cut out butter and uses olive oil. Hyperlipidemia status: good compliance Medication compliance: good compliance Past cholesterol meds: none Supplements: fish oil and red yeast rice Aspirin :  no The 10-year ASCVD risk score (Arnett DK, et al., 2019) is: 11.6%   Values used to calculate the score:     Age: 70 years     Clincally relevant sex: Female     Is Non-Hispanic African American: No     Diabetic: No     Tobacco smoker: No     Systolic Blood Pressure: 118 mmHg     Is BP treated: No     HDL Cholesterol: 53 mg/dL     Total Cholesterol: 236 mg/dL Chest pain:  no Coronary artery disease:  no Family history CAD:  yes -- father lived until 64 with higher levels and no treatment Family history early CAD:  no   OSTEOPENIA DEXA 10/15/21. Takes supplements. Satisfied with current treatment?: yes Adequate calcium  & vitamin D : yes Weight bearing exercises: yes   Depression Screen done today and results listed below:     09/24/2024    8:09 AM 03/08/2024    8:13 AM 10/27/2023    2:02 PM 09/09/2023    8:53 AM 03/08/2023    8:23 AM  Depression screen PHQ 2/9  Decreased Interest 0 0 0 0 0  Down, Depressed, Hopeless 0 0 1 0 0   PHQ - 2 Score 0 0 1 0 0  Altered sleeping 0 0 0 0 0  Tired, decreased energy 0 0 0 1 1  Change in appetite 0 0 0 0 0  Feeling bad or failure about yourself  0 0 0 0 0  Trouble concentrating 0 0 0 0 0  Moving slowly or fidgety/restless 0 0 0 0 0  Suicidal thoughts 0 0 0 0 0  PHQ-9 Score 0 0  1  1  1    Difficult doing work/chores Not difficult at all Not difficult at all Not difficult at all Not difficult at all Not difficult at all     Data saved with a previous flowsheet row definition      09/24/2024    8:09 AM 03/08/2024    8:13 AM 09/09/2023    8:54 AM 03/08/2023    8:23 AM  GAD 7 : Generalized Anxiety Score  Nervous, Anxious, on Edge 0 0 0 0  Control/stop worrying 0 0 0 0  Worry too much - different things 0 0 0 0  Trouble relaxing 0 0 0 0  Restless 0 0 0 0  Easily annoyed or irritable 0 0 0 0  Afraid -  awful might happen 0 0 0 0  Total GAD 7 Score 0 0 0 0  Anxiety Difficulty  Not difficult at all Not difficult at all Not difficult at all      09/09/2023    8:53 AM 10/23/2023    1:14 PM 10/27/2023    2:05 PM 03/08/2024    8:13 AM 09/24/2024    8:09 AM  Fall Risk  Falls in the past year? 0 0 0 0 0  Was there an injury with Fall? 0  0 0 0  Fall Risk Category Calculator 0  0 0 0  Patient at Risk for Falls Due to No Fall Risks  No Fall Risks No Fall Risks No Fall Risks  Fall risk Follow up Falls evaluation completed  Falls prevention discussed Falls evaluation completed Falls evaluation completed    Functional Status Survey: Is the patient deaf or have difficulty hearing?: No Does the patient have difficulty seeing, even when wearing glasses/contacts?: No Does the patient have difficulty concentrating, remembering, or making decisions?: No Does the patient have difficulty walking or climbing stairs?: No Does the patient have difficulty dressing or bathing?: No Does the patient have difficulty doing errands alone such as visiting a doctor's office or shopping?: No   Past  Medical History:  Past Medical History:  Diagnosis Date   Allergy    Chest pain    2017 ED VISIT FOR CHEST PAIN , R/O AS GASTRIC CAUSE    Depression    per NP notes, on cymbalta    Headache    per patient very rare sometimes when not well rested   Hyperlipidemia    Increased pressure in the eye    THEY DID SURGERY ON MY EYES TO REDUCE THE PRESSURE , THEY MADE TWO HOLES IN MY EYES    Osteopenia    Spinal stenosis    LUMBAR    Vertigo    IMPROVED BUT HAS OCCASIONAL SX    Surgical History:  Past Surgical History:  Procedure Laterality Date   APPENDECTOMY     BACK SURGERY  11/2019   CESAREAN SECTION     COLONOSCOPY WITH PROPOFOL  N/A 08/26/2020   Procedure: COLONOSCOPY WITH PROPOFOL ;  Surgeon: Janalyn Keene NOVAK, MD;  Location: [facility];  Service: Endoscopy;  Laterality: N/A;   COLONOSCOPY WITH PROPOFOL  N/A 10/05/2023   Procedure: COLONOSCOPY WITH PROPOFOL ;  Surgeon: Jinny Carmine, MD;  Location: [facility];  Service: Endoscopy;  Laterality: N/A;   DILATION AND CURETTAGE OF UTERUS     EYE SURGERY     THEY DID SURGERY ON MY EYES TO REDUCE THE PRESSURE , THEY MADE TWO HOLES IN MY EYES    JOINT REPLACEMENT     POLYPECTOMY  10/05/2023   Procedure: POLYPECTOMY;  Surgeon: Jinny Carmine, MD;  Location: [facility];  Service: Endoscopy;;   REDUCTION MAMMAPLASTY  1988   SPINE SURGERY     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   Cholecalciferol  (VITAMIN D ) 50 MCG (2000 UT) CAPS Take 2,000 Units by mouth daily.   dorzolamide-timolol  (COSOPT) 2-0.5 % ophthalmic solution Place 1 drop into both eyes 2 (two) times daily.   Multiple Vitamins-Minerals (ZINC PO) Take 1 tablet by mouth daily.  Omega-3 Fatty Acids (FISH OIL) 1000 MG CAPS Take 1,000 mg by mouth daily.   No current facility-administered medications on file prior to visit.    Allergies:  Allergies  Allergen Reactions   Combigan  [Brimonidine  Tartrate-Timolol ] Other (See Comments)    Irritated eyes, redness and itching   Prednisone Other (See Comments)    Altered Mental Status    Social History:  Social History   Socioeconomic History   Marital status: Married    Spouse name: Elsie   Number of children: 1   Years of education: college    Highest education level: Associate degree: occupational, scientist, product/process development, or vocational program  Occupational History   Occupation: retired   Tobacco Use   Smoking status: Never   Smokeless tobacco: Never  Vaping Use   Vaping status: Never Used  Substance and Sexual Activity   Alcohol use: Yes    Alcohol/week: 1.0 standard drink of alcohol    Types: 1 Shots of liquor per week    Comment: 1 glass of wine daily with dinner   Drug use: No   Sexual activity: Not Currently  Other Topics Concern   Not on file  Social History Narrative   Not on file   Social Drivers of Health   Financial Resource Strain: Patient Declined (09/20/2024)   Overall Financial Resource Strain (CARDIA)    Difficulty of Paying Living Expenses: Patient declined  Food Insecurity: Patient Declined (09/20/2024)   Hunger Vital Sign    Worried About Running Out of Food in the Last Year: Patient declined    Ran Out of Food in the Last Year: Patient declined  Transportation Needs: Patient Declined (09/20/2024)   PRAPARE - Administrator, Civil Service (Medical): Patient declined    Lack of Transportation (Non-Medical): Patient declined  Physical Activity: Insufficiently Active (09/20/2024)   Exercise Vital Sign    Days of Exercise per Week: 3 days    Minutes of Exercise per Session: 30 min  Stress: No Stress Concern Present (09/20/2024)   [employer] of Occupational Health -  Occupational Stress Questionnaire    Feeling of Stress: Not at all  Social Connections: Socially Integrated (09/20/2024)   Social Connection and Isolation Panel    Frequency of Communication with Friends and Family: More than three times a week    Frequency of Social Gatherings with Friends and Family: Three times a week    Attends Religious Services: More than 4 times per year    Active Member of Clubs or Organizations: Yes    Attends Banker Meetings: More than 4 times per year    Marital Status: Married  Catering Manager Violence: Not At Risk (09/24/2024)   Humiliation, Afraid, Rape, and Kick questionnaire    Fear of Current or Ex-Partner: No    Emotionally Abused: No    Physically Abused: No    Sexually Abused: No   Social History   Tobacco Use  Smoking Status Never  Smokeless Tobacco Never   Social History   Substance and Sexual Activity  Alcohol Use Yes   Alcohol/week: 1.0 standard drink of alcohol   Types: 1 Shots of liquor per week   Comment: 1 glass of wine daily with dinner    Family History:  Family History  Problem Relation Age of Onset   Breast cancer Mother 53       and again at 80   Lung disease Mother    Cancer Mother    Emphysema Mother  CAD Father    Hyperlipidemia Sister    Mental illness Daughter        bipolar   Cystic fibrosis Daughter    Breast cancer Maternal Aunt    Breast cancer Maternal Grandmother     Past medical history, surgical history, medications, allergies, family history and social history reviewed with patient today and changes made to appropriate areas of the chart.   ROS All other ROS negative except what is listed above and in the HPI.      Objective:    BP 118/73 (BP Location: Left Arm, Patient Position: Sitting, Cuff Size: Normal)   Pulse 61   Temp 97.9 F (36.6 C) (Oral)   Resp 16   Ht 5' 2.01 (1.575 m)   Wt 159 lb (72.1 kg)   LMP  (LMP Unknown)   SpO2 98%   BMI 29.07 kg/m   Wt Readings from  Last 3 Encounters:  09/24/24 159 lb (72.1 kg)  04/19/24 156 lb (70.8 kg)  03/08/24 156 lb 12.8 oz (71.1 kg)    Physical Exam Vitals and nursing note reviewed. Exam conducted with a chaperone present.  Constitutional:      General: She is awake. She is not in acute distress.    Appearance: She is well-developed and well-groomed. She is not ill-appearing or toxic-appearing.  HENT:     Head: Normocephalic and atraumatic.     Right Ear: Hearing, tympanic membrane, ear canal and external ear normal. No drainage.     Left Ear: Hearing, tympanic membrane, ear canal and external ear normal. No drainage.     Nose: Nose normal.     Right Sinus: No maxillary sinus tenderness or frontal sinus tenderness.     Left Sinus: No maxillary sinus tenderness or frontal sinus tenderness.     Mouth/Throat:     Mouth: Mucous membranes are moist.     Pharynx: Oropharynx is clear. Uvula midline. No pharyngeal swelling, oropharyngeal exudate or posterior oropharyngeal erythema.  Eyes:     General: Lids are normal.        Right eye: No discharge.        Left eye: No discharge.     Extraocular Movements: Extraocular movements intact.     Conjunctiva/sclera: Conjunctivae normal.     Pupils: Pupils are equal, round, and reactive to light.     Visual Fields: Right eye visual fields normal and left eye visual fields normal.  Neck:     Thyroid : No thyromegaly.     Vascular: No carotid bruit.     Trachea: Trachea normal.  Cardiovascular:     Rate and Rhythm: Normal rate and regular rhythm.     Heart sounds: Normal heart sounds. No murmur heard.    No gallop.  Pulmonary:     Effort: Pulmonary effort is normal. No accessory muscle usage or respiratory distress.     Breath sounds: Normal breath sounds.  Chest:  Breasts:    Right: Normal.     Left: Normal.  Abdominal:     General: Bowel sounds are normal.     Palpations: Abdomen is soft. There is no hepatomegaly or splenomegaly.     Tenderness: There is no  abdominal tenderness.  Musculoskeletal:        General: Normal range of motion.     Cervical back: Normal range of motion and neck supple.     Right lower leg: No edema.     Left lower leg: No edema.  Lymphadenopathy:  Head:     Right side of head: No submental, submandibular, tonsillar, preauricular or posterior auricular adenopathy.     Left side of head: No submental, submandibular, tonsillar, preauricular or posterior auricular adenopathy.     Cervical: No cervical adenopathy.     Upper Body:     Right upper body: No supraclavicular, axillary or pectoral adenopathy.     Left upper body: No supraclavicular, axillary or pectoral adenopathy.  Skin:    General: Skin is warm and dry.     Capillary Refill: Capillary refill takes less than 2 seconds.     Findings: No rash.  Neurological:     Mental Status: She is alert and oriented to person, place, and time.     Gait: Gait is intact.     Deep Tendon Reflexes: Reflexes are normal and symmetric.     Reflex Scores:      Brachioradialis reflexes are 2+ on the right side and 2+ on the left side.      Patellar reflexes are 2+ on the right side and 2+ on the left side. Psychiatric:        Attention and Perception: Attention normal.        Mood and Affect: Mood normal.        Speech: Speech normal.        Behavior: Behavior normal. Behavior is cooperative.        Thought Content: Thought content normal.        Judgment: Judgment normal.       10/27/2023    2:05 PM 10/04/2022    8:39 AM 09/08/2020    2:38 PM 08/20/2019    9:31 AM 06/23/2018    8:17 AM  6CIT Screen  What Year? 0 points 0 points 0 points 0 points 0 points  What month? 0 points 0 points 0 points 0 points 0 points  What time? 0 points 0 points 0 points 0 points 0 points  Count back from 20 0 points 0 points 0 points 0 points 0 points  Months in reverse 2 points 0 points 0 points 0 points 0 points  Repeat phrase 2 points 0 points 2 points 0 points 0 points  Total  Score 4 points 0 points 2 points 0 points 0 points   Results for orders placed or performed in visit on 03/08/24  Comprehensive metabolic panel with GFR   Collection Time: 03/08/24  8:34 AM  Result Value Ref Range   Glucose 87 70 - 99 mg/dL   BUN 16 8 - 27 mg/dL   Creatinine, Ser 9.24 0.57 - 1.00 mg/dL   eGFR 85 >40 fO/fpw/8.26   BUN/Creatinine Ratio 21 12 - 28   Sodium 141 134 - 144 mmol/L   Potassium 4.4 3.5 - 5.2 mmol/L   Chloride 104 96 - 106 mmol/L   CO2 22 20 - 29 mmol/L   Calcium  8.8 8.7 - 10.3 mg/dL   Total Protein 6.1 6.0 - 8.5 g/dL   Albumin  4.0 3.8 - 4.8 g/dL   Globulin, Total 2.1 1.5 - 4.5 g/dL   Bilirubin Total 0.3 0.0 - 1.2 mg/dL   Alkaline Phosphatase 71 44 - 121 IU/L   AST 16 0 - 40 IU/L   ALT 15 0 - 32 IU/L  Lipid Panel w/o Chol/HDL Ratio   Collection Time: 03/08/24  8:34 AM  Result Value Ref Range   Cholesterol, Total 236 (H) 100 - 199 mg/dL   Triglycerides 83 0 - 149 mg/dL  HDL 53 >39 mg/dL   VLDL Cholesterol Cal 14 5 - 40 mg/dL   LDL Chol Calc (NIH) 830 (H) 0 - 99 mg/dL      Assessment & Plan:   Problem List Items Addressed This Visit       Cardiovascular and Mediastinum   Aortic atherosclerosis   Chronic.  Noted on lumbar imaging in October 2019.  Continue focus on healthy diet and exercise for prevention.  Does not wish to start statin or ASA. CT Cardiac Scoring 0 in May 2025.        Musculoskeletal and Integument   Osteopenia   Chronic, ongoing.  Recent DEXA remains stable, no worsening.  Continue supplements and check labs at physical.  Repeat DEXA around 10/15/2026.      Relevant Orders   VITAMIN D  25 Hydroxy (Vit-D Deficiency, Fractures)     Other   Hypercholesteremia - Primary   Chronic, aortic atherosclerosis noted on lumbar imaging. She is hesitant to start statins and is currently focusing on diet, exercise, and taking an omega 3 supplement. Will check lipid panel today.  CT Cardiac Scoring 0 in May 2025. The 10-year ASCVD risk  score (Arnett DK, et al., 2019) is: 11.6%   Values used to calculate the score:     Age: 29 years     Clincally relevant sex: Female     Is Non-Hispanic African American: No     Diabetic: No     Tobacco smoker: No     Systolic Blood Pressure: 118 mmHg     Is BP treated: No     HDL Cholesterol: 53 mg/dL     Total Cholesterol: 236 mg/dL       Relevant Orders   Comprehensive metabolic panel with GFR   Lipid Panel w/o Chol/HDL Ratio   Elevated blood-pressure reading, without diagnosis of hypertension   Stable with BP at goal.  Continue diet focus and regular activity.  Labs today.      Relevant Orders   CBC with Differential/Platelet   TSH   BMI 29.0-29.9,adult   BMI 29.07.  Recommended eating smaller high protein, low fat meals more frequently and exercising 30 mins a day 5 times a week with a goal of 10-15lb weight loss in the next 3 months. Patient voiced their understanding and motivation to adhere to these recommendations.       Relevant Orders   Comprehensive metabolic panel with GFR   Lipid Panel w/o Chol/HDL Ratio   Other Visit Diagnoses       Encounter for screening mammogram for malignant neoplasm of breast       Mammogram ordered and aware how to schedule.   Relevant Orders   MM 3D SCREENING MAMMOGRAM BILATERAL BREAST     Encounter for annual physical exam       Annual physical today with labs and health maintenance reviewed, discussed with patient.        Follow up plan: Return in about 6 months (around 03/25/2025) for HTN/HLD, OSTEOPENIA.   LABORATORY TESTING:  - Pap smear: not applicable  IMMUNIZATIONS:   - Tdap: Tetanus vaccination status reviewed: last tetanus booster within 10 years. - Influenza: Up to Date - Pneumovax: Up to date - Prevnar: Up to date - COVID: Up to date - HPV: Not applicable - Shingrix vaccine: will do this in future  SCREENING: -Mammogram: Up to date due around 10/23/24 - Colonoscopy: Up to date  - Bone Density: Up to date  due around 10/15/26 -Hearing  Test: Not applicable  -Spirometry: Not applicable   PATIENT COUNSELING:   Advised to take 1 mg of folate supplement per day if capable of pregnancy.   Sexuality: Discussed sexually transmitted diseases, partner selection, use of condoms, avoidance of unintended pregnancy  and contraceptive alternatives.   Advised to avoid cigarette smoking.  I discussed with the patient that most people either abstain from alcohol or drink within safe limits (<=14/week and <=4 drinks/occasion for males, <=7/weeks and <= 3 drinks/occasion for females) and that the risk for alcohol disorders and other health effects rises proportionally with the number of drinks per week and how often a drinker exceeds daily limits.  Discussed cessation/primary prevention of drug use and availability of treatment for abuse.   Diet: Encouraged to adjust caloric intake to maintain  or achieve ideal body weight, to reduce intake of dietary saturated fat and total fat, to limit sodium intake by avoiding high sodium foods and not adding table salt, and to maintain adequate dietary potassium and calcium  preferably from fresh fruits, vegetables, and low-fat dairy products.    Stressed the importance of regular exercise  Injury prevention: Discussed safety belts, safety helmets, smoke detector, smoking near bedding or upholstery.   Dental health: Discussed importance of regular tooth brushing, flossing, and dental visits.    NEXT PREVENTATIVE PHYSICAL DUE IN 1 YEAR. Return in about 6 months (around 03/25/2025) for HTN/HLD, OSTEOPENIA.

## 2024-09-24 NOTE — Assessment & Plan Note (Signed)
 BMI 29.07.  Recommended eating smaller high protein, low fat meals more frequently and exercising 30 mins a day 5 times a week with a goal of 10-15lb weight loss in the next 3 months. Patient voiced their understanding and motivation to adhere to these recommendations.

## 2024-09-24 NOTE — Assessment & Plan Note (Signed)
 Chronic, ongoing.  Recent DEXA remains stable, no worsening.  Continue supplements and check labs at physical.  Repeat DEXA around 10/15/2026.

## 2024-09-24 NOTE — Assessment & Plan Note (Signed)
 Chronic, aortic atherosclerosis noted on lumbar imaging. She is hesitant to start statins and is currently focusing on diet, exercise, and taking an omega 3 supplement. Will check lipid panel today.  CT Cardiac Scoring 0 in May 2025. The 10-year ASCVD risk score (Arnett DK, et al., 2019) is: 11.6%   Values used to calculate the score:     Age: 73 years     Clincally relevant sex: Female     Is Non-Hispanic African American: No     Diabetic: No     Tobacco smoker: No     Systolic Blood Pressure: 118 mmHg     Is BP treated: No     HDL Cholesterol: 53 mg/dL     Total Cholesterol: 236 mg/dL

## 2024-09-24 NOTE — Assessment & Plan Note (Signed)
Stable with BP at goal.  Continue diet focus and regular activity.  Labs today.

## 2024-09-25 ENCOUNTER — Ambulatory Visit: Payer: Self-pay | Admitting: Nurse Practitioner

## 2024-09-25 LAB — CBC WITH DIFFERENTIAL/PLATELET
Basophils Absolute: 0 x10E3/uL (ref 0.0–0.2)
Basos: 1 %
EOS (ABSOLUTE): 0.3 x10E3/uL (ref 0.0–0.4)
Eos: 6 %
Hematocrit: 39.4 % (ref 34.0–46.6)
Hemoglobin: 12.5 g/dL (ref 11.1–15.9)
Immature Grans (Abs): 0 x10E3/uL (ref 0.0–0.1)
Immature Granulocytes: 0 %
Lymphocytes Absolute: 1.8 x10E3/uL (ref 0.7–3.1)
Lymphs: 35 %
MCH: 30.5 pg (ref 26.6–33.0)
MCHC: 31.7 g/dL (ref 31.5–35.7)
MCV: 96 fL (ref 79–97)
Monocytes Absolute: 0.5 x10E3/uL (ref 0.1–0.9)
Monocytes: 9 %
Neutrophils Absolute: 2.6 x10E3/uL (ref 1.4–7.0)
Neutrophils: 49 %
Platelets: 263 x10E3/uL (ref 150–450)
RBC: 4.1 x10E6/uL (ref 3.77–5.28)
RDW: 13.1 % (ref 11.7–15.4)
WBC: 5.3 x10E3/uL (ref 3.4–10.8)

## 2024-09-25 LAB — COMPREHENSIVE METABOLIC PANEL WITH GFR
ALT: 15 IU/L (ref 0–32)
AST: 17 IU/L (ref 0–40)
Albumin: 4 g/dL (ref 3.8–4.8)
Alkaline Phosphatase: 53 IU/L (ref 49–135)
BUN/Creatinine Ratio: 25 (ref 12–28)
BUN: 20 mg/dL (ref 8–27)
Bilirubin Total: 0.4 mg/dL (ref 0.0–1.2)
CO2: 22 mmol/L (ref 20–29)
Calcium: 9 mg/dL (ref 8.7–10.3)
Chloride: 103 mmol/L (ref 96–106)
Creatinine, Ser: 0.81 mg/dL (ref 0.57–1.00)
Globulin, Total: 1.8 g/dL (ref 1.5–4.5)
Glucose: 87 mg/dL (ref 70–99)
Potassium: 4.2 mmol/L (ref 3.5–5.2)
Sodium: 137 mmol/L (ref 134–144)
Total Protein: 5.8 g/dL — ABNORMAL LOW (ref 6.0–8.5)
eGFR: 77 mL/min/1.73 (ref >59)

## 2024-09-25 LAB — VITAMIN D 25 HYDROXY (VIT D DEFICIENCY, FRACTURES): Vit D, 25-Hydroxy: 45 ng/mL (ref 30.0–100.0)

## 2024-09-25 LAB — LIPID PANEL W/O CHOL/HDL RATIO
Cholesterol, Total: 257 mg/dL — ABNORMAL HIGH (ref 100–199)
HDL: 75 mg/dL (ref >39)
LDL Chol Calc (NIH): 171 mg/dL — ABNORMAL HIGH (ref 0–99)
Triglycerides: 67 mg/dL (ref 0–149)
VLDL Cholesterol Cal: 11 mg/dL (ref 5–40)

## 2024-09-25 LAB — TSH: TSH: 2.7 u[IU]/mL (ref 0.450–4.500)

## 2024-09-25 NOTE — Progress Notes (Signed)
 Contacted via MyChart  Good morning Breanna Davidson, your labs have returned and overall remain stable with exception of Lipid panel which continues to show some elevations. Continue your focus on healthy diet and regular exercise, we will plan on repeat CT cardiac scoring in 2027. Any questions? Keep being stellar and enjoy your travels!!  Thank you for allowing me to participate in your care.  I appreciate you. Kindest regards, Jajuan Skoog

## 2024-09-27 DIAGNOSIS — H2513 Age-related nuclear cataract, bilateral: Secondary | ICD-10-CM | POA: Diagnosis not present

## 2024-09-27 DIAGNOSIS — H40053 Ocular hypertension, bilateral: Secondary | ICD-10-CM | POA: Diagnosis not present

## 2024-11-06 ENCOUNTER — Other Ambulatory Visit: Payer: Self-pay | Admitting: Medical Genetics

## 2024-11-08 ENCOUNTER — Ambulatory Visit: Payer: Self-pay | Admitting: Emergency Medicine

## 2024-11-08 VITALS — Ht 62.0 in | Wt 153.0 lb

## 2024-11-08 DIAGNOSIS — Z0000 Encounter for general adult medical examination without abnormal findings: Secondary | ICD-10-CM | POA: Diagnosis not present

## 2024-11-08 NOTE — Patient Instructions (Signed)
 Breanna Davidson,  Thank you for taking the time for your Medicare Wellness Visit. I appreciate your continued commitment to your health goals. Please review the care plan we discussed, and feel free to reach out if I can assist you further.  Please note that Annual Wellness Visits do not include a physical exam. Some assessments may be limited, especially if the visit was conducted virtually. If needed, we may recommend an in-person follow-up with your provider.  Ongoing Care Seeing your primary care provider every 3 to 6 months helps us  monitor your health and provide consistent, personalized care.   Referrals If a referral was made during today's visit and you haven't received any updates within two weeks, please contact the referred provider directly to check on the status.  Recommended Screenings:  Keep up the good work!!  Health Maintenance  Topic Date Due   COVID-19 Vaccine (7 - 2025-26 season) 06/25/2024   Medicare Annual Wellness Visit  10/26/2024   Zoster (Shingles) Vaccine (1 of 2) 12/22/2024*   Breast Cancer Screening  10/23/2025   Osteoporosis screening with Bone Density Scan  10/15/2026   Colon Cancer Screening  10/04/2030   DTaP/Tdap/Td vaccine (3 - Tdap) 04/14/2031   Pneumococcal Vaccine for age over 53  Completed   Flu Shot  Completed   Hepatitis C Screening  Completed   Meningitis B Vaccine  Aged Out   Hepatitis B Vaccine  Discontinued  *Topic was postponed. The date shown is not the original due date.       11/08/2024   11:24 AM  Advanced Directives  Does Patient Have a Medical Advance Directive? Yes  Type of Estate Agent of [place];Living will  Does patient want to make changes to medical advance directive? No - Patient declined  Copy of Healthcare Power of Attorney in Chart? Yes - validated most recent copy scanned in chart (See row information)    Vision: Annual vision screenings are recommended for early detection of glaucoma, cataracts,  and diabetic retinopathy. These exams can also reveal signs of chronic conditions such as diabetes and high blood pressure.  Dental: Annual dental screenings help detect early signs of oral cancer, gum disease, and other conditions linked to overall health, including heart disease and diabetes.  Please see the attached documents for additional preventive care recommendations.

## 2024-11-08 NOTE — Progress Notes (Signed)
 "  Chief Complaint  Patient presents with   Medicare Wellness     Subjective:   Breanna Davidson is a 74 y.o. female who presents for a Medicare Annual Wellness Visit.  Visit info / Clinical Intake: Medicare Wellness Visit Type:: Subsequent Annual Wellness Visit Persons participating in visit and providing information:: patient Medicare Wellness Visit Mode:: Video Since this visit was completed virtually, some vitals may be partially provided or unavailable. Missing vitals are due to the limitations of the virtual format.: Documented vitals are patient reported If Telephone or Video please confirm:: I connected with patient using audio/video enable telemedicine. I verified patient identity with two identifiers, discussed telehealth limitations, and patient agreed to proceed. Patient Location:: home Provider Location:: clinic office Interpreter Needed?: No Pre-visit prep was completed: yes AWV questionnaire completed by patient prior to visit?: yes Date:: 11/07/24 Living arrangements:: lives with spouse/significant other Patient's Overall Health Status Rating: very good Typical amount of pain: some Does pain affect daily life?: no Are you currently prescribed opioids?: no  Dietary Habits and Nutritional Risks How many meals a day?: 3 Eats fruit and vegetables daily?: yes Most meals are obtained by: preparing own meals In the last 2 weeks, have you had any of the following?: none Diabetic:: no  Functional Status Activities of Daily Living (to include ambulation/medication): Independent Ambulation: Independent with device- listed below Home Assistive Devices/Equipment: Eyeglasses Medication Administration: Independent Home Management (perform basic housework or laundry): Independent Manage your own finances?: yes Primary transportation is: driving Concerns about vision?: no *vision screening is required for WTM* Concerns about hearing?: no  Fall Screening Falls in the past  year?: 0 Number of falls in past year: 0 Was there an injury with Fall?: 0 Fall Risk Category Calculator: 0 Patient Fall Risk Level: Low Fall Risk  Fall Risk Patient at Risk for Falls Due to: No Fall Risks Fall risk Follow up: Falls evaluation completed  Home and Transportation Safety: All rugs have non-skid backing?: yes All stairs or steps have railings?: yes Grab bars in the bathtub or shower?: (!) no Have non-skid surface in bathtub or shower?: yes Good home lighting?: yes Regular seat belt use?: yes Hospital stays in the last year:: no  Cognitive Assessment Difficulty concentrating, remembering, or making decisions? : no Will 6CIT or Mini Cog be Completed: yes What year is it?: 0 points What month is it?: 0 points Give patient an address phrase to remember (5 components): [address] [place] About what time is it?: 0 points Count backwards from 20 to 1: 0 points Say the months of the year in reverse: 0 points Repeat the address phrase from earlier: 0 points 6 CIT Score: 0 points  Advance Directives (For Healthcare) Does Patient Have a Medical Advance Directive?: Yes Does patient want to make changes to medical advance directive?: No - Patient declined Type of Advance Directive: Healthcare Power of [place]; Living will Copy of Healthcare Power of Attorney in Chart?: Yes - validated most recent copy scanned in chart (See row information) Copy of Living Will in Chart?: Yes - validated most recent copy scanned in chart (See row information)  Reviewed/Updated  Reviewed/Updated: Reviewed All (Medical, Surgical, Family, Medications, Allergies, Care Teams, Patient Goals)    Allergies (verified) Combigan  [brimonidine  tartrate-timolol ] and Prednisone   Current Medications (verified) Outpatient Encounter Medications as of 11/08/2024  Medication Sig   CALCIUM -MAGNESIUM  PO Take 1 tablet by mouth daily.   Cholecalciferol  (VITAMIN D ) 50 MCG (2000 UT) CAPS Take 2,000 Units  by mouth daily.   dorzolamide-timolol  (COSOPT) 2-0.5 % ophthalmic solution Place 1 drop into both eyes 2 (two) times daily.   Multiple Vitamins-Minerals (ZINC PO) Take 1 tablet by mouth daily.   nystatin  (MYCOSTATIN /NYSTOP ) powder Apply 1 Application topically 2 (two) times daily as needed.   Omega-3 Fatty Acids (FISH OIL) 1000 MG CAPS Take 1,000 mg by mouth daily.   No facility-administered encounter medications on file as of 11/08/2024.    History: Past Medical History:  Diagnosis Date   Allergy    Chest pain    2017 ED VISIT FOR CHEST PAIN , R/O AS GASTRIC CAUSE    Depression    per NP notes, on cymbalta    Headache    per patient very rare sometimes when not well rested   Hyperlipidemia    Increased pressure in the eye    THEY DID SURGERY ON MY EYES TO REDUCE THE PRESSURE , THEY MADE TWO HOLES IN MY EYES    Osteopenia    Spinal stenosis    LUMBAR    Vertigo    IMPROVED BUT HAS OCCASIONAL SX   Past Surgical History:  Procedure Laterality Date   APPENDECTOMY     BACK SURGERY  11/2019   CESAREAN SECTION     COLONOSCOPY WITH PROPOFOL  N/A 08/26/2020   Procedure: COLONOSCOPY WITH PROPOFOL ;  Surgeon: Janalyn Keene NOVAK, MD;  Location: [facility] ENDOSCOPY;  Service: Endoscopy;  Laterality: N/A;   COLONOSCOPY WITH PROPOFOL  N/A 10/05/2023   Procedure: COLONOSCOPY WITH PROPOFOL ;  Surgeon: Jinny Carmine, MD;  Location: [facility] ENDOSCOPY;  Service: Endoscopy;  Laterality: N/A;   DILATION AND CURETTAGE OF UTERUS     EYE SURGERY     THEY DID SURGERY ON MY EYES TO REDUCE THE PRESSURE , THEY MADE TWO HOLES IN MY EYES    JOINT REPLACEMENT     POLYPECTOMY  10/05/2023   Procedure: POLYPECTOMY;  Surgeon: Jinny Carmine, MD;  Location: [facility] ENDOSCOPY;  Service: Endoscopy;;   REDUCTION MAMMAPLASTY  1988   SPINE SURGERY     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   Procedure: TOTAL HIP ARTHROPLASTY ANTERIOR APPROACH;  Surgeon: Beverley Evalene BIRCH, MD;  Location: [facility] ORS;  Service: Orthopedics;   Laterality: Left;   Family History  Problem Relation Age of Onset   Breast cancer Mother 65       and again at 65   Lung disease Mother    Cancer Mother    Emphysema Mother    CAD Father    Hyperlipidemia Sister    Mental illness Daughter        bipolar   Cystic fibrosis Daughter    Breast cancer Maternal Aunt    Breast cancer Maternal Grandmother    Social History   Occupational History   Occupation: retired   Tobacco Use   Smoking status: Never   Smokeless tobacco: Never  Vaping Use   Vaping status: Never Used  Substance and Sexual Activity   Alcohol use: Yes    Alcohol/week: 2.0 standard drinks of alcohol    Types: 2 Glasses of wine per week    Comment: 1 glass of wine daily with dinner twice a week   Drug use: No   Sexual activity: Not Currently   Tobacco Counseling Counseling given: Not Answered  SDOH Screenings   Food Insecurity: No Food Insecurity (11/08/2024)  Housing: Low Risk (11/08/2024)  Transportation Needs: No Transportation Needs (11/08/2024)  Utilities: Not At Risk (11/08/2024)  Alcohol Screen: Low Risk (09/20/2024)  Depression (  PHQ2-9): Low Risk (11/08/2024)  Financial Resource Strain: Patient Declined (09/20/2024)  Physical Activity: Sufficiently Active (11/08/2024)  Recent Concern: Physical Activity - Insufficiently Active (09/20/2024)  Social Connections: Socially Integrated (11/08/2024)  Stress: No Stress Concern Present (11/08/2024)  Tobacco Use: Low Risk (11/08/2024)  Health Literacy: Adequate Health Literacy (11/08/2024)   See flowsheets for full screening details  Depression Screen PHQ 2 & 9 Depression Scale- Over the past 2 weeks, how often have you been bothered by any of the following problems? Little interest or pleasure in doing things: 0 Feeling down, depressed, or hopeless (PHQ Adolescent also includes...irritable): 0 PHQ-2 Total Score: 0 Trouble falling or staying asleep, or sleeping too much: 0 Feeling tired or having little energy:  0 Poor appetite or overeating (PHQ Adolescent also includes...weight loss): 0 Feeling bad about yourself - or that you are a failure or have let yourself or your family down: 0 Trouble concentrating on things, such as reading the newspaper or watching television (PHQ Adolescent also includes...like school work): 0 Moving or speaking so slowly that other people could have noticed. Or the opposite - being so fidgety or restless that you have been moving around a lot more than usual: 0 Thoughts that you would be better off dead, or of hurting yourself in some way: 0 PHQ-9 Total Score: 0 If you checked off any problems, how difficult have these problems made it for you to do your work, take care of things at home, or get along with other people?: Not difficult at all  Depression Treatment Depression Interventions/Treatment : EYV7-0 Score <4 Follow-up Not Indicated     Goals Addressed             This Visit's Progress    Patient Stated   Not on track    Maintain health and lose a little weight             Objective:    Today's Vitals   11/08/24 1120  Weight: 153 lb (69.4 kg)  Height: 5' 2 (1.575 m)   Body mass index is 27.98 kg/m.  Hearing/Vision screen Hearing Screening - Comments:: Denies hearing loss  Vision Screening - Comments:: UTD w/ Dr. Jaye @ [facility] [place] Immunizations and Health Maintenance Health Maintenance  Topic Date Due   COVID-19 Vaccine (7 - 2025-26 season) 06/25/2024   Zoster Vaccines- Shingrix (1 of 2) 12/22/2024 (Originally 03/11/2001)   Mammogram  10/23/2025   Medicare Annual Wellness (AWV)  11/08/2025   Bone Density Scan  10/15/2026   Colonoscopy  10/04/2030   DTaP/Tdap/Td (3 - Tdap) 04/14/2031   Pneumococcal Vaccine: 50+ Years  Completed   Influenza Vaccine  Completed   Hepatitis C Screening  Completed   Meningococcal B Vaccine  Aged Out   Hepatitis B Vaccines 19-59 Average Risk  Discontinued        Assessment/Plan:   This is a routine wellness examination for Yanissa.  Patient Care Team: Cannady, Jolene T, NP as PCP - General (Nurse Practitioner) Jinny Carmine, MD as Consulting Physician (Gastroenterology) Dasher, Alm LABOR, MD (Dermatology) Jaye Fallow, MD as Referring Physician (Ophthalmology) Carlos Hardin HERO, MD as Referring Physician (Orthopedic Surgery)  I have personally reviewed and noted the following in the patients chart:   Medical and social history Use of alcohol, tobacco or illicit drugs  Current medications and supplements including opioid prescriptions. Functional ability and status Nutritional status Physical activity Advanced directives List of other physicians Hospitalizations, surgeries, and ER visits in previous 12 months Vitals  Screenings to include cognitive, depression, and falls Referrals and appointments  No orders of the defined types were placed in this encounter.  In addition, I have reviewed and discussed with patient certain preventive protocols, quality metrics, and best practice recommendations. A written personalized care plan for preventive services as well as general preventive health recommendations were provided to patient.   Vina Ned, CMA   11/08/2024   Return in 1 year (on 11/12/2025) for Medicare Annual Wellness Visit.  After Visit Summary: (MyChart) Due to this being a telephonic visit, the after visit summary with patients personalized plan was offered to patient via MyChart   Nurse Notes:  MMG scheduled for 11/12/24 Declined shingles and covid vaccines "

## 2024-11-12 ENCOUNTER — Ambulatory Visit
Admission: RE | Admit: 2024-11-12 | Discharge: 2024-11-12 | Disposition: A | Source: Ambulatory Visit | Attending: Nurse Practitioner | Admitting: Nurse Practitioner

## 2024-11-12 DIAGNOSIS — Z1231 Encounter for screening mammogram for malignant neoplasm of breast: Secondary | ICD-10-CM | POA: Diagnosis present

## 2024-11-14 ENCOUNTER — Other Ambulatory Visit: Payer: Self-pay | Admitting: Nurse Practitioner

## 2024-11-14 DIAGNOSIS — R928 Other abnormal and inconclusive findings on diagnostic imaging of breast: Secondary | ICD-10-CM

## 2024-11-22 ENCOUNTER — Ambulatory Visit
Admission: RE | Admit: 2024-11-22 | Discharge: 2024-11-22 | Disposition: A | Discharge status: Home / Self Care | Source: Ambulatory Visit | Attending: Nurse Practitioner | Admitting: Nurse Practitioner

## 2024-11-22 DIAGNOSIS — R928 Other abnormal and inconclusive findings on diagnostic imaging of breast: Secondary | ICD-10-CM | POA: Insufficient documentation

## 2024-11-26 ENCOUNTER — Other Ambulatory Visit: Payer: Self-pay | Admitting: Nurse Practitioner

## 2024-11-26 DIAGNOSIS — R928 Other abnormal and inconclusive findings on diagnostic imaging of breast: Secondary | ICD-10-CM

## 2024-11-27 ENCOUNTER — Inpatient Hospital Stay
Admission: RE | Admit: 2024-11-27 | Discharge: 2024-11-27 | Attending: Nurse Practitioner | Admitting: Nurse Practitioner

## 2024-11-27 DIAGNOSIS — R928 Other abnormal and inconclusive findings on diagnostic imaging of breast: Secondary | ICD-10-CM

## 2024-11-27 MED ORDER — LIDOCAINE HCL (PF) 2 % IJ SOLN
5.0000 mL | Freq: Once | INTRAMUSCULAR | Status: AC
  Administered 2024-11-27: 5 mL

## 2024-11-27 MED ORDER — LIDOCAINE 1 % OPTIME INJ - NO CHARGE
2.0000 mL | Freq: Once | INTRAMUSCULAR | Status: AC
  Administered 2024-11-27: 2 mL
  Filled 2024-11-27: qty 2

## 2024-11-28 ENCOUNTER — Encounter: Payer: Self-pay | Admitting: *Deleted

## 2024-11-28 DIAGNOSIS — C50911 Malignant neoplasm of unspecified site of right female breast: Secondary | ICD-10-CM

## 2024-11-28 LAB — SURGICAL PATHOLOGY

## 2024-11-28 NOTE — Progress Notes (Signed)
 Received referral for newly diagnosed breast cancer from [facility].  Navigation initiated.  Ms. Chojnowski would like to see Dr. Melanee and Dr. Ebbie.   She will see Dr. Melanee on Tuesday 2/10 and Dr. Ebbie Wed. 2/11.

## 2024-12-04 ENCOUNTER — Inpatient Hospital Stay

## 2024-12-04 ENCOUNTER — Inpatient Hospital Stay: Admitting: Oncology

## 2025-01-23 ENCOUNTER — Inpatient Hospital Stay

## 2025-03-11 ENCOUNTER — Ambulatory Visit: Admitting: Nurse Practitioner

## 2025-11-12 ENCOUNTER — Ambulatory Visit
